# Patient Record
Sex: Female | Born: 1945 | Race: White | Hispanic: No | Smoking: Never smoker
Health system: Southern US, Community
[De-identification: ages and names within clinical notes are randomized; demographics above are authoritative.]

## PROBLEM LIST (undated history)

## (undated) DIAGNOSIS — R9430 Abnormal result of cardiovascular function study, unspecified: Secondary | ICD-10-CM

## (undated) DIAGNOSIS — H269 Unspecified cataract: Secondary | ICD-10-CM

## (undated) DIAGNOSIS — E781 Pure hyperglyceridemia: Secondary | ICD-10-CM

## (undated) DIAGNOSIS — I10 Essential (primary) hypertension: Secondary | ICD-10-CM

## (undated) DIAGNOSIS — I5181 Takotsubo syndrome: Secondary | ICD-10-CM

## (undated) DIAGNOSIS — J189 Pneumonia, unspecified organism: Secondary | ICD-10-CM

## (undated) DIAGNOSIS — IMO0002 Reserved for concepts with insufficient information to code with codable children: Secondary | ICD-10-CM

## (undated) DIAGNOSIS — B1920 Unspecified viral hepatitis C without hepatic coma: Secondary | ICD-10-CM

## (undated) HISTORY — PX: COLONOSCOPY: SHX174

## (undated) HISTORY — DX: Reserved for concepts with insufficient information to code with codable children: IMO0002

## (undated) HISTORY — DX: Takotsubo syndrome: I51.81

## (undated) HISTORY — DX: Abnormal result of cardiovascular function study, unspecified: R94.30

## (undated) HISTORY — PX: CHOLECYSTECTOMY: SHX55

## (undated) HISTORY — DX: Unspecified cataract: H26.9

---

## 1997-10-31 ENCOUNTER — Other Ambulatory Visit: Admission: RE | Admit: 1997-10-31 | Discharge: 1997-10-31 | Payer: Self-pay | Admitting: Gastroenterology

## 1998-01-18 ENCOUNTER — Other Ambulatory Visit: Admission: RE | Admit: 1998-01-18 | Discharge: 1998-01-18 | Payer: Self-pay | Admitting: *Deleted

## 1998-07-01 ENCOUNTER — Ambulatory Visit (HOSPITAL_COMMUNITY): Admission: RE | Admit: 1998-07-01 | Discharge: 1998-07-01 | Payer: Self-pay | Admitting: Gastroenterology

## 1998-07-01 ENCOUNTER — Encounter: Payer: Self-pay | Admitting: Gastroenterology

## 2000-01-08 ENCOUNTER — Encounter: Payer: Self-pay | Admitting: Emergency Medicine

## 2000-01-08 ENCOUNTER — Encounter: Admission: RE | Admit: 2000-01-08 | Discharge: 2000-01-08 | Payer: Self-pay | Admitting: Emergency Medicine

## 2001-05-18 ENCOUNTER — Inpatient Hospital Stay (HOSPITAL_COMMUNITY): Admission: EM | Admit: 2001-05-18 | Discharge: 2001-05-19 | Payer: Self-pay | Admitting: Surgery

## 2001-05-18 ENCOUNTER — Encounter (INDEPENDENT_AMBULATORY_CARE_PROVIDER_SITE_OTHER): Payer: Self-pay | Admitting: Specialist

## 2001-05-18 ENCOUNTER — Encounter: Payer: Self-pay | Admitting: Surgery

## 2001-08-01 ENCOUNTER — Ambulatory Visit (HOSPITAL_COMMUNITY): Admission: RE | Admit: 2001-08-01 | Discharge: 2001-08-01 | Payer: Self-pay | Admitting: Gastroenterology

## 2001-08-01 ENCOUNTER — Encounter (INDEPENDENT_AMBULATORY_CARE_PROVIDER_SITE_OTHER): Payer: Self-pay | Admitting: Specialist

## 2007-01-28 ENCOUNTER — Ambulatory Visit: Payer: Self-pay | Admitting: Family Medicine

## 2007-01-28 DIAGNOSIS — I10 Essential (primary) hypertension: Secondary | ICD-10-CM

## 2007-01-28 DIAGNOSIS — E1165 Type 2 diabetes mellitus with hyperglycemia: Secondary | ICD-10-CM | POA: Insufficient documentation

## 2007-03-07 ENCOUNTER — Ambulatory Visit: Payer: Self-pay | Admitting: Family Medicine

## 2007-03-07 DIAGNOSIS — B182 Chronic viral hepatitis C: Secondary | ICD-10-CM

## 2007-03-07 LAB — CONVERTED CEMR LAB
ALT: 88 units/L — ABNORMAL HIGH (ref 0–35)
AST: 76 units/L — ABNORMAL HIGH (ref 0–37)
Calcium: 9.4 mg/dL (ref 8.4–10.5)
Chloride: 102 meq/L (ref 96–112)
Creatinine, Ser: 0.58 mg/dL (ref 0.40–1.20)
Hgb A1c MFr Bld: 5.4 %
Sodium: 141 meq/L (ref 135–145)

## 2007-03-23 ENCOUNTER — Encounter: Admission: RE | Admit: 2007-03-23 | Discharge: 2007-03-23 | Payer: Self-pay | Admitting: Family Medicine

## 2007-04-12 ENCOUNTER — Encounter: Payer: Self-pay | Admitting: Family Medicine

## 2007-05-02 ENCOUNTER — Ambulatory Visit: Payer: Self-pay | Admitting: Family Medicine

## 2007-05-02 DIAGNOSIS — Z86010 Personal history of colonic polyps: Secondary | ICD-10-CM

## 2007-07-13 ENCOUNTER — Telehealth (INDEPENDENT_AMBULATORY_CARE_PROVIDER_SITE_OTHER): Payer: Self-pay | Admitting: *Deleted

## 2007-07-29 ENCOUNTER — Encounter: Payer: Self-pay | Admitting: Family Medicine

## 2007-08-08 ENCOUNTER — Ambulatory Visit: Payer: Self-pay | Admitting: Family Medicine

## 2007-10-11 ENCOUNTER — Telehealth: Payer: Self-pay | Admitting: Family Medicine

## 2007-11-17 ENCOUNTER — Encounter: Payer: Self-pay | Admitting: Family Medicine

## 2007-11-22 ENCOUNTER — Encounter: Payer: Self-pay | Admitting: Family Medicine

## 2007-12-05 ENCOUNTER — Ambulatory Visit: Payer: Self-pay | Admitting: Family Medicine

## 2007-12-06 ENCOUNTER — Telehealth: Payer: Self-pay | Admitting: *Deleted

## 2007-12-08 ENCOUNTER — Ambulatory Visit: Payer: Self-pay | Admitting: Family Medicine

## 2007-12-08 LAB — CONVERTED CEMR LAB
Albumin: 3.9 g/dL (ref 3.5–5.2)
Alkaline Phosphatase: 67 units/L (ref 39–117)
BUN: 25 mg/dL — ABNORMAL HIGH (ref 6–23)
Calcium: 9.2 mg/dL (ref 8.4–10.5)
Chloride: 104 meq/L (ref 96–112)
Glucose, Bld: 141 mg/dL — ABNORMAL HIGH (ref 70–99)
HDL: 34 mg/dL — ABNORMAL LOW (ref >39)
Potassium: 4.5 meq/L (ref 3.5–5.3)
Triglycerides: 382 mg/dL — ABNORMAL HIGH (ref <150)

## 2007-12-21 ENCOUNTER — Ambulatory Visit: Payer: Self-pay | Admitting: Internal Medicine

## 2008-01-12 ENCOUNTER — Ambulatory Visit: Payer: Self-pay | Admitting: Internal Medicine

## 2008-01-12 ENCOUNTER — Encounter: Payer: Self-pay | Admitting: Internal Medicine

## 2008-01-16 ENCOUNTER — Encounter: Payer: Self-pay | Admitting: Internal Medicine

## 2008-01-17 ENCOUNTER — Encounter: Payer: Self-pay | Admitting: Family Medicine

## 2008-02-21 ENCOUNTER — Ambulatory Visit: Payer: Self-pay | Admitting: Sports Medicine

## 2008-02-21 ENCOUNTER — Encounter: Payer: Self-pay | Admitting: Family Medicine

## 2008-02-21 DIAGNOSIS — N958 Other specified menopausal and perimenopausal disorders: Secondary | ICD-10-CM

## 2008-02-21 DIAGNOSIS — E669 Obesity, unspecified: Secondary | ICD-10-CM | POA: Insufficient documentation

## 2008-02-23 ENCOUNTER — Encounter: Payer: Self-pay | Admitting: Family Medicine

## 2008-03-12 ENCOUNTER — Encounter: Payer: Self-pay | Admitting: Family Medicine

## 2008-03-14 ENCOUNTER — Encounter: Payer: Self-pay | Admitting: Family Medicine

## 2008-04-13 ENCOUNTER — Ambulatory Visit: Payer: Self-pay | Admitting: Family Medicine

## 2008-04-13 DIAGNOSIS — E1142 Type 2 diabetes mellitus with diabetic polyneuropathy: Secondary | ICD-10-CM

## 2008-04-13 LAB — CONVERTED CEMR LAB
Glucose, Urine, Semiquant: NEGATIVE
Hgb A1c MFr Bld: 6.2 %
Ketones, urine, test strip: NEGATIVE
Specific Gravity, Urine: 1.02
pH: 7

## 2008-06-26 ENCOUNTER — Ambulatory Visit: Payer: Self-pay | Admitting: Family Medicine

## 2008-10-17 ENCOUNTER — Encounter: Payer: Self-pay | Admitting: Family Medicine

## 2009-01-05 ENCOUNTER — Emergency Department (HOSPITAL_COMMUNITY): Admission: EM | Admit: 2009-01-05 | Discharge: 2009-01-05 | Payer: Self-pay | Admitting: Emergency Medicine

## 2009-01-25 ENCOUNTER — Ambulatory Visit: Payer: Self-pay | Admitting: Family Medicine

## 2009-01-25 DIAGNOSIS — E781 Pure hyperglyceridemia: Secondary | ICD-10-CM

## 2009-01-25 DIAGNOSIS — G4700 Insomnia, unspecified: Secondary | ICD-10-CM

## 2009-02-26 ENCOUNTER — Ambulatory Visit: Payer: Self-pay | Admitting: Family Medicine

## 2009-02-26 ENCOUNTER — Encounter: Payer: Self-pay | Admitting: Family Medicine

## 2009-02-27 LAB — CONVERTED CEMR LAB
CO2: 24 meq/L (ref 19–32)
Calcium: 9.1 mg/dL (ref 8.4–10.5)
Chloride: 101 meq/L (ref 96–112)
Glucose, Bld: 165 mg/dL — ABNORMAL HIGH (ref 70–99)
LDL Cholesterol: 57 mg/dL (ref 0–99)
Sodium: 139 meq/L (ref 135–145)
Total CHOL/HDL Ratio: 4.3
VLDL: 59 mg/dL — ABNORMAL HIGH (ref 0–40)

## 2009-07-03 ENCOUNTER — Ambulatory Visit: Payer: Self-pay | Admitting: Family Medicine

## 2009-07-03 LAB — CONVERTED CEMR LAB: Hgb A1c MFr Bld: 7.2 %

## 2010-02-21 ENCOUNTER — Encounter: Payer: Self-pay | Admitting: Family Medicine

## 2010-07-24 NOTE — Miscellaneous (Signed)
  Clinical Lists Changes  Medications: Changed medication from AMARYL 2 MG  TABS (GLIMEPIRIDE) one half tab  by mouth daily to AMARYL 2 MG  TABS (GLIMEPIRIDE) one tab  by mouth daily

## 2010-07-24 NOTE — Assessment & Plan Note (Signed)
Summary: f/u dm,df   Vital Signs:  Patient profile:   65 year old female Weight:      193.8 pounds Temp:     98 degrees F oral Pulse rate:   76 / minute Pulse rhythm:   regular BP sitting:   132 / 78  (right arm) Cuff size:   large  Vitals Entered By: Loralee Pacas CMA (July 03, 2009 3:01 PM)  Primary Care Provider:  Doralee Albino MD   History of Present Illness: C/O itching for months and it coincided with starting losartin.    BP is good.  Admits to dietary indiscretion over holidays.  Discussed mild increase in A1C.    No other complaints.  Current Medications (verified): 1)  Amaryl 2 Mg  Tabs (Glimepiride) .... One Half Tab  By Mouth Daily 2)  Metformin Hcl 1000 Mg  Tabs (Metformin Hcl) .... One Tab Bid 3)  Aspir-Low 81 Mg Tbec (Aspirin) .... One Daily 4)  Losartan Potassium-Hctz 100-12.5 Mg Tabs (Losartan Potassium-Hctz) .... One By Mouth Daily 5)  Claritin 10 Mg Tabs (Loratadine) .... Once Daily As Needed Allergy 6)  Metoprolol Tartrate 50 Mg  Tabs (Metoprolol Tartrate) .... One By Mouth Two Times A Day 7)  Ascensia Contour Test   Strp (Glucose Blood) .... Test Blood Sugar Twice Daily 8)  Relion Lancets   Misc (Lancets) .... Use To Test Blood Sugar Twice Daily 9)  Nortriptyline Hcl 25 Mg Caps (Nortriptyline Hcl) .... One By Mouth Three Times A Day 10)  Zolpidem Tartrate 10 Mg Tabs (Zolpidem Tartrate) .... One Tab By Mouth At Bedtime As Needed Sleep  Allergies (verified): 1)  ! Lisinopril  Past History:  Past medical, surgical, family and social histories (including risk factors) reviewed, and no changes noted (except as noted below).  Past Medical History: Reviewed history from 01/28/2007 and no changes required. See HPI  Past Surgical History: Reviewed history from 01/31/2007 and no changes required. S/P cholecystectomy around 2000 S/P BTL  Family History: Reviewed history from 01/28/2007 and no changes required. Family History of  Alcoholism/Addiction Family History of Aneurysm Aortic Family History Hypertension Family History of Stroke M - actually was cerebral hemorrhage Family History of CAD Female 1st degree relative <50  Social History: Reviewed history from 08/08/2007 and no changes required. 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 Tobacco never EtOh she watches intake. Has joined gym  Physical Exam  General:  Well-developed,well-nourished,in no acute distress; alert,appropriate and cooperative throughout examination Lungs:  Normal respiratory effort, chest expands symmetrically. Lungs are clear to auscultation, no crackles or wheezes. Heart:  Normal rate and regular rhythm. S1 and S2 normal without gallop, murmur, click, rub or other extra sounds. Skin:  No lesions, just dry   Impression & Recommendations:  Problem # 1:  HYPERTENSION, BENIGN ESSENTIAL (ICD-401.1)  Doubt allergy to losartin.  Probably just dry skin and her use of body wash.  See instructions Her updated medication list for this problem includes:    Losartan Potassium-hctz 100-12.5 Mg Tabs (Losartan potassium-hctz) ..... One by mouth daily    Metoprolol Tartrate 50 Mg Tabs (Metoprolol tartrate) ..... One by mouth two times a day  Orders: [facility]- Est Level  3 (40981)  Problem # 2:  DM, UNCOMPLICATED, TYPE II (ICD-250.00) Focus on diet.  FU three months.  If still elevated, increase amaryl. Her updated medication list for this problem includes:    Amaryl 2 Mg Tabs (Glimepiride) ..... One half tab  by mouth daily  Metformin Hcl 1000 Mg Tabs (Metformin hcl) ..... One tab bid    Aspir-low 81 Mg Tbec (Aspirin) ..... One daily    Losartan Potassium-hctz 100-12.5 Mg Tabs (Losartan potassium-hctz) ..... One by mouth daily  Orders: A1C-[facility] (16109) [facility]- Est Level  3 (60454)  Complete Medication List: 1)  Amaryl 2 Mg Tabs (Glimepiride) .... One half tab  by mouth daily 2)  Metformin Hcl 1000 Mg Tabs (Metformin hcl) .... One tab  bid 3)  Aspir-low 81 Mg Tbec (Aspirin) .... One daily 4)  Losartan Potassium-hctz 100-12.5 Mg Tabs (Losartan potassium-hctz) .... One by mouth daily 5)  Claritin 10 Mg Tabs (Loratadine) .... Once daily as needed allergy 6)  Metoprolol Tartrate 50 Mg Tabs (Metoprolol tartrate) .... One by mouth two times a day 7)  Ascensia Contour Test Strp (Glucose blood) .... Test blood sugar twice daily 8)  Relion Lancets Misc (Lancets) .... Use to test blood sugar twice daily 9)  Nortriptyline Hcl 25 Mg Caps (Nortriptyline hcl) .... One by mouth three times a day 10)  Zolpidem Tartrate 10 Mg Tabs (Zolpidem tartrate) .... One tab by mouth at bedtime as needed sleep  Other Orders: Influenza Vaccine NON MCR (09811)  Patient Instructions: 1)  First stop body wash 2)  If still itching try off losartin for 2 weeks and call me 3)  Get more serious about the diet and exercise, You diabetes control is not quite as good as before. 4)  I should recheck in 3 monrths, sooner if we have to switch you from losartin. 5)  Get recertified with Jaynee Eagles - once recertifiied, lets get that mammogram done.   Immunizations Administered:  Influenza Vaccine # 1:    Vaccine Type: Fluvax Non-MCR    Site: right deltoid    Mfr: GlaxoSmithKline    Dose: 0.5 ml    Route: IM    Given by: Loralee Pacas CMA    Exp. Date: 12/19/2009    Lot #: [id number]    VIS given: 01/29/2009  Flu Vaccine Consent Questions:    Do you have a history of severe allergic reactions to this vaccine? no    Any prior history of allergic reactions to egg and/or gelatin? no    Do you have a sensitivity to the preservative Thimersol? no    Do you have a past history of Guillan-Barre Syndrome? no    Do you currently have an acute febrile illness? no    Have you ever had a severe reaction to latex? no    Vaccine information given and explained to patient? yes    Are you currently pregnant? no   Laboratory Results   Blood Tests   Date/Time  Received: July 03, 2009 3:00 PM  Date/Time Reported: July 03, 2009 3:14 PM   HGBA1C: 7.2%   (Normal Range: Non-Diabetic - 3-6%   Control Diabetic - 6-8%)  Comments: ...........test performed by...........Marland KitchenTerese Door, CMA      Prevention & Chronic Care Immunizations   Influenza vaccine: Fluvax Non-MCR  (07/03/2009)   Influenza vaccine due: 04/13/2009    Tetanus booster: 10/20/2004: given   Tetanus booster due: 10/21/2014    Pneumococcal vaccine: Not documented    H. zoster vaccine: Not documented  Colorectal Screening   Hemoccult: Not documented   Hemoccult due: Not Indicated    Colonoscopy: abnormal  (01/12/2008)   Colonoscopy due: 01/11/2018  Other Screening   Pap smear: normal  (02/21/2008)   Pap smear due: 02/21/2011    Mammogram: normal  (  03/23/2007)   Mammogram due: 03/22/2008    DXA bone density scan: Not documented   Smoking status: never  (01/25/2009)  Diabetes Mellitus   HgbA1C: 7.2  (07/03/2009)   Hemoglobin A1C due: 11/05/2007    Eye exam: normal  (10/17/2008)   Eye exam due: 10/17/2009    Foot exam: yes  (04/13/2008)   Foot exam action/deferral: Do today   High risk foot: Not documented   Foot care education: Not documented   Foot exam due: 12/04/2008    Urine microalbumin/creatinine ratio: Not documented   Urine microalbumin action/deferral: Not indicated    Diabetes flowsheet reviewed?: Yes   Progress toward A1C goal: Unchanged  Lipids   Total Cholesterol: 151  (02/26/2009)   LDL: 57  (02/26/2009)   LDL Direct: Not documented   HDL: 35  (02/26/2009)   Triglycerides: 293  (02/26/2009)    SGOT (AST): 67  (12/08/2007)   SGPT (ALT): 77  (12/08/2007)   Alkaline phosphatase: 67  (12/08/2007)   Total bilirubin: 0.4  (12/08/2007)    Lipid flowsheet reviewed?: Yes   Progress toward LDL goal: At goal  Hypertension   Last Blood Pressure: 132 / 78  (07/03/2009)   Serum creatinine: 0.72  (02/26/2009)   Serum potassium 3.8   (02/26/2009)    Hypertension flowsheet reviewed?: Yes   Progress toward BP goal: At goal  Self-Management Support :   Personal Goals (by the next clinic visit) :     Personal A1C goal: 7  (07/03/2009)     Personal blood pressure goal: 130/80  (07/03/2009)     Personal LDL goal: 100  (07/03/2009)    Diabetes self-management support: Written self-care plan  (07/03/2009)   Diabetes care plan printed    Hypertension self-management support: Written self-care plan  (07/03/2009)   Hypertension self-care plan printed.    Lipid self-management support: Written self-care plan  (07/03/2009)   Lipid self-care plan printed.

## 2010-07-31 ENCOUNTER — Other Ambulatory Visit: Payer: Self-pay | Admitting: Family Medicine

## 2010-07-31 MED ORDER — METFORMIN HCL 1000 MG PO TABS: 1000.0000 mg | ORAL_TABLET | Freq: Two times a day (BID) | ORAL | Status: DC

## 2010-07-31 NOTE — Telephone Encounter (Signed)
There was an error with this electronic refill.

## 2010-08-01 ENCOUNTER — Other Ambulatory Visit: Payer: Self-pay | Admitting: Family Medicine

## 2010-08-01 MED ORDER — METFORMIN HCL 1000 MG PO TABS: 1000.0000 mg | ORAL_TABLET | Freq: Two times a day (BID) | ORAL | Status: DC

## 2010-08-01 NOTE — Telephone Encounter (Signed)
Addended by: Tivis Ringer on: 08/01/2010 09:42 AM   Modules accepted: Orders

## 2010-08-05 NOTE — Telephone Encounter (Signed)
Received a error message on the metformin prescription.  I called the pharmacy and verified that they do have a prescription for her waiting to be picked up.  No further action needed.

## 2010-08-20 ENCOUNTER — Encounter: Payer: Self-pay | Admitting: Family Medicine

## 2010-08-20 ENCOUNTER — Ambulatory Visit (INDEPENDENT_AMBULATORY_CARE_PROVIDER_SITE_OTHER): Payer: Self-pay | Admitting: Family Medicine

## 2010-08-20 VITALS — BP 160/90 | HR 80 | Temp 97.6°F | Ht 60.75 in | Wt 186.4 lb

## 2010-08-20 DIAGNOSIS — E781 Pure hyperglyceridemia: Secondary | ICD-10-CM

## 2010-08-20 DIAGNOSIS — I10 Essential (primary) hypertension: Secondary | ICD-10-CM

## 2010-08-20 DIAGNOSIS — E119 Type 2 diabetes mellitus without complications: Secondary | ICD-10-CM

## 2010-08-20 LAB — CONVERTED CEMR LAB
ALT: 74 units/L — ABNORMAL HIGH (ref 0–35)
Alkaline Phosphatase: 97 units/L (ref 39–117)
Creatinine, Ser: 0.65 mg/dL (ref 0.40–1.20)
Glucose, Bld: 363 mg/dL — ABNORMAL HIGH (ref 70–99)
Sodium: 131 meq/L — ABNORMAL LOW (ref 135–145)
Total Bilirubin: 0.6 mg/dL (ref 0.3–1.2)
Total CHOL/HDL Ratio: 6
Total Protein: 7.8 g/dL (ref 6.0–8.3)
Triglycerides: 717 mg/dL — ABNORMAL HIGH (ref <150)

## 2010-08-20 LAB — LIPID PANEL
HDL: 27 mg/dL — ABNORMAL LOW (ref >39)
Total CHOL/HDL Ratio: 6 Ratio

## 2010-08-20 LAB — COMPREHENSIVE METABOLIC PANEL
AST: 64 U/L — ABNORMAL HIGH (ref 0–37)
Alkaline Phosphatase: 97 U/L (ref 39–117)
BUN: 22 mg/dL (ref 6–23)
Creat: 0.65 mg/dL (ref 0.40–1.20)

## 2010-08-20 MED ORDER — GLIMEPIRIDE 4 MG PO TABS: 4.0000 mg | ORAL_TABLET | Freq: Every day | ORAL | Status: DC

## 2010-08-20 NOTE — Progress Notes (Signed)
I called this prescription in after getting the error message

## 2010-08-20 NOTE — Progress Notes (Signed)
  Subjective:    Patient ID: Becky Fields, female    DOB: [birth year], 65 y.o.   MRN: [id number]  HPI Called in glimiperide to pharm after notification of failed e Rx   Review of Systems     Objective:   Physical Exam        Assessment & Plan:

## 2010-08-20 NOTE — Progress Notes (Signed)
  Subjective:    Patient ID: Becky Fields, female    DOB: [birth year], 65 y.o.   MRN: [id number]  HPI  Returns after one year hiatus due to finances. DM poor control.  Diet is healthier a very small weight loss.  Not exercising HBP not on one of her meds.  High No blood work in over 1 year.  Fasting this morning so can check chol Gernerally feels well    Review of SystemsNo chest pain, SOB, leg edema or unusual bleeding     Objective:   Physical Exam Neck supple  Lungs clear Cardiac RRR without murmur Abd benign        Assessment & Plan:

## 2010-08-20 NOTE — Patient Instructions (Signed)
Two prescriptions at [facility].  Restart a hypertension med and a higher dose of your glimepiride. Keep working on diet and exercise.  Meds alone will not bring your Diabetes under control See Rudell Cobb.  Call me when certified and I will order a mammogram See me in 2-3 months.  We will need to do a pap smear (your last one) this year.

## 2010-08-20 NOTE — Assessment & Plan Note (Signed)
Restart losartin/HCTZ

## 2010-08-20 NOTE — Assessment & Plan Note (Signed)
Recheck lipid panel 

## 2010-08-20 NOTE — Assessment & Plan Note (Signed)
Increase glimepiride.  Emphasize diet and exercise

## 2010-08-21 ENCOUNTER — Encounter: Payer: Self-pay | Admitting: Family Medicine

## 2010-08-21 ENCOUNTER — Telehealth: Payer: Self-pay | Admitting: Family Medicine

## 2010-08-21 NOTE — Telephone Encounter (Signed)
Called and left message that needs new med for triglycerides - so call office.  Also sent letter.

## 2010-08-22 ENCOUNTER — Telehealth: Payer: Self-pay | Admitting: Family Medicine

## 2010-08-22 MED ORDER — FENOFIBRATE 145 MG PO TABS: 145.0000 mg | ORAL_TABLET | Freq: Every day | ORAL | Status: DC

## 2010-08-22 NOTE — Telephone Encounter (Signed)
Started on tricor for triglycerides above 500

## 2010-08-22 NOTE — Telephone Encounter (Signed)
Pt is calling Dr Leveda Anna back about her trigycerides.  Please call her at number provided below

## 2010-08-25 ENCOUNTER — Telehealth: Payer: Self-pay | Admitting: Family Medicine

## 2010-08-25 MED ORDER — OMEGA-3 FATTY ACIDS 1000 MG PO CAPS: 1.0000 g | ORAL_CAPSULE | Freq: Three times a day (TID) | ORAL | Status: AC

## 2010-08-25 NOTE — Telephone Encounter (Signed)
Due to cost, will treat high triglycerides with fish oil rather than tricor.

## 2010-08-26 ENCOUNTER — Other Ambulatory Visit: Payer: Self-pay | Admitting: Family Medicine

## 2010-08-27 NOTE — Telephone Encounter (Signed)
Refill request

## 2010-09-17 ENCOUNTER — Other Ambulatory Visit: Payer: Self-pay | Admitting: Family Medicine

## 2010-09-18 NOTE — Telephone Encounter (Signed)
Refill request

## 2010-09-28 LAB — GLUCOSE, CAPILLARY: Glucose-Capillary: 124 mg/dL — ABNORMAL HIGH (ref 70–99)

## 2010-10-17 ENCOUNTER — Other Ambulatory Visit: Payer: Self-pay | Admitting: Family Medicine

## 2010-10-18 NOTE — Telephone Encounter (Signed)
Refill request

## 2010-11-07 NOTE — Procedures (Signed)
[place]. [facility]  Patient:    Becky Fields, Becky Fields Visit Number: [id number] MRN: [id number]          Service Type: END Location: ENDO Attending Physician:  Nelda Marseille Dictated by:   Petra Kuba, M.D. Proc. Date: 08/01/01 Admit Date:  08/01/2001   CC:         Viviann Spare A. Cleta Alberts, M.D.   Procedure Report  PROCEDURE:  Colonoscopy, polypectomy.  INDICATION:  Patient with a family history of colon polyps, overdue for colonic screening.  Consent was signed after risks, benefits, methods, and options thoroughly discussed in the office.  MEDICATIONS:  Demerol 100 mg, Versed 10 mg.  DESCRIPTION OF PROCEDURE:  Rectal inspection was pertinent for external hemorrhoids.  Digital exam was negative.  The video pediatric adjustable colonoscope was inserted, easily advanced around the colon to the cecum.  This did require some abdominal pressure but no position changes.  The cecum was identified by the appendiceal orifice and the ileocecal valve.  In fact, the scope was inserted a short way into the terminal ileum, which was normal. Photo documentation was obtained.  The scope was slowly withdrawn.  The cecum was normal.  The prep was adequate.  There was some liquid stool throughout that required frequent washing and suctioning.  In the ascending colon, two tiny questionable polyps were seen and were all hot biopsied x1.  In the transverse, another questionable tiny polyp was seen and was hot biopsied x1 and put in the same container.  As the scope was withdrawn around the left side of the colon in the mid-descending, a questionable another tiny polyp was seen and was hot biopsied and put in a separate container.  The scope was slowly withdrawn back to the rectum.  No additional findings were seen.  Once back in the rectum, the scope was then retroflexed, pertinent for some internal hemorrhoids.  The scope was straightened and readvanced a short way around the  left side of the colon, air was suctioned, and the scope removed. The patient tolerated the procedure well.  There was no obvious immediate complication.  ENDOSCOPIC DIAGNOSES: 1. Internal/external hemorrhoids, small. 2. Descending, ascending, and transverse polyps, all tiny and hot biopsied. 3. Otherwise within normal limits to the terminal ileum.  PLAN:  Await pathology to determine future colonic screening and continue workup with an EGD.  Please see that dictation for details. Dictated by:   Petra Kuba, M.D. Attending Physician:  Nelda Marseille DD:  08/01/01 TD:  08/01/01 Job: [id number]

## 2010-11-07 NOTE — H&P (Signed)
[facility]  Patient:    Becky Fields, Becky Fields Visit Number: [id number] MRN: [id number]          Service Type: EMS Location: ED Attending Physician:  Andre Lefort Dictated by:   Sandria Bales. Ezzard Standing, M.D. Admit Date:  05/18/2001   CC:         Lesle Chris, M.D.  Petra Kuba, M.D.   History and Physical  DATE OF BIRTH: [birth year]  HISTORY OF PRESENT ILLNESS: This is a 65 year old, white female, whose primary care doctor is Dr. Lesle Chris. She sees Dr. Vida Rigger from a GI standpoint, who has had vague abdominal complaints for the last couple of months. These are often precipitated by food. She actually had an appointment to see Dr. Ewing Schlein sometime in the future. Then this past Sunday, the 24th of November, she developed nausea, vomiting, and increasing abdominal pain, which has gotten steadily worse over the last few days. She saw Dr. Cleta Alberts today, who sent her to [facility]. At [facility] she had an ultrasound of her abdomen read by Mattie Marlin, which revealed cholelithiasis with changes consistent with cholecystitis.  The patient has ulcer as a 26 year old child, but has had no ulcer disease as an adult. She has had a diagnosis of hepatitis C, apparently first diagonal non-A 9b in the early 1990s, then refined to a hepatitis C in 1999 and has been followed by Dr. Vida Rigger. Her last liver biopsy, she thinks, was about one year ago.  She has a history of peptic ulcer disease, colon disease. Her only prior abdominal surgery was a tubal ligation about 1991.  ALLERGIES: She has no allergies though CODEINE makes her dizzy.  REVIEW OF SYSTEMS: PULMONARY: No history of pneumonia. She has had some respiratory trouble which actually worsened in 1999 when she found out she had hepatitis C, but with annual immunization her lungs have gotten steadily better. She is actually doing pretty well at this time. CARDIAC: No history  of known heart disease, chest pain or hypertension. GASTROINTESTINAL: See history of present illness. UROLOGIC: No history of kidney stones or kidney infection. GYNECOLOGIC: She had her last menstrual period about 25. She is a gravida 2, para 2. 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  She is accompanied by her husband in the emergency room.  PHYSICAL EXAMINATION:  VITAL SIGNS: On physical examination, her temperature is 100.2, respirations 20, blood pressure 137/91, pulse of 96.  GENERAL: She is a well-nourished, white female, alert and cooperative examination.  HEENT: Her HEENT is unremarkable.  NECK: Supple without mass or thyromegaly.  LUNGS: Clear to auscultation.  HEART: Regular rate and rhythm without murmur or rub.  BREASTS: Her breast are symmetrical. She has had mammograms which have been negative.  ABDOMEN: Soft. She does have soreness, tenderness in the right upper quadrant consistent with biliary disease. I feel no other organomegaly, mass or hernia.  RECTAL: I did not do a rectal examination on her.  EXTREMITIES: She has good strength in all four extremities.  NEUROLOGICAL: Grossly intact.  LABORATORY DATA: Labs that I have show a white blood count of 15,300, a hemoglobin of 16/7. Her lipase was 14, amylase 42. Her serum electrocardiogram were sodium 132, potassium 3.9, chloride of 97, glucose of 127. Her SGOT is 64, SGPT is 64. Her alk. phos. is 85, her total bilirubin 1.4.  IMPRESSION: 1. Acute cholecystitis with cholelithiasis. Discussed with the patient and her    husband about proceeding with surgery  today. Discussed the potential    complications including to but not limited to bleeding, infection, bowel    duct injury, and possible open surgery. I think they understand pretty well    the operation and potential side effects.  We will proceed with    cholecystectomy today. 2. Hepatitis C, stable at this time, followed by Dr. Vida Rigger. 3. History of respiratory problems which are on the mend at this current time. ictated by:   Sandria Bales. Ezzard Standing, M.D. Attending Physician:  Andre Lefort DD:  05/18/01 TD:  05/18/01 Job: [id number]

## 2010-11-07 NOTE — Op Note (Signed)
[place]. [facility]  Patient:    Becky Fields, Becky Fields Visit Number: [id number] MRN: [id number]          Service Type: SUR Location: 3W 0348 01 Attending Physician:  Andre Lefort Dictated by:   Sandria Bales. Ezzard Standing, M.D. Proc. Date: 05/18/01 Admit Date:  05/18/2001 Discharge Date: 05/18/2001   CC:         Lesle Chris, M.D.  Petra Kuba, M.D.   Operative Report  DATE OF BIRTH:  2045-09-10  PREOPERATIVE DIAGNOSIS:  Acute cholecystitis, cholelithiasis.  POSTOPERATIVE DIAGNOSIS:  Acute cholecystitis, cholelithiasis.  PROCEDURE:  Laparoscopic cholecystectomy with intraoperative cholangiogram.  SURGEON:  Sandria Bales. Newman.  FIRST ASSISTANT:  Maisie Fus B. Samuella Cota, M.D.  ANESTHESIA:  General endotracheal.  ESTIMATED BLOOD LOSS:  Minimal.  INDICATIONS FOR PROCEDURE:  Ms. Dawe is a 65 year old white female, a patient of Dr. Lesle Chris, and has known to hepatitis C followed by Dr. Vida Rigger. She has a 2-3 month history of vague abdominal pain but now has presented with a three-day history of acute abdominal pain, nausea, and vomiting.  Ultrasound shows multiple gallbladder with thickened gallbladder wall consistent with acute cholecystitis.  The patient now comes for attempted laparoscopic cholecystectomy.  OPERATIVE NOTE:  The patient was placed in a supine position, given a general endotracheal anesthetic.  She was given 1 g of Ancef prior to the procedure. She had an NG tube in placed and PS stockings in place.  Her abdomen was prepped with Betadine solution and sterilely draped.  An infraumbilical incision was made with sharp dissection and carried down to the abdominal cavity.  A 0-degree laparoscope was inserted through a 12-mm Hasson trocar and the Hasson trocar secured with a 0 Vicryl suture.  The gallbladder was visualized, was noted to be tight, acute/chronically inflamed, with omentum stuck to it.  I dissected off the omentum, placed three  additional trocars, a 10-mm subxiphoid, a 5-mm right mid-subcostal, and a 5-mm lateral subcostal trocar.  The gallbladder was grasped, rotated cephalad.  The gallbladder was acutely inflamed.  It was a fairly difficult dissection with a thickened gallbladder wall, a lot of inflammation.  She also had very chronic disease with a lot o thickening to the wall.  I was able to get down to a cystic artery, identified this, triply Endoclipped the cystic duct, a clip placed on the gallbladder at the site of the cystic duct.  The cystic duct was then cut.  I then an intraoperative cholangiogram.  A cut off Taut catheter was inserted through the cystic duct, and intraoperative cholangiogram obtained with half-strength Hypaque solution using about 6 cc.  It showed free flow of contrast down the cystic duct into the common bile duct into the duodenum and back up the hepatic radicles.  This was felt to be a normal intraoperative cholangiogram without filling defect or obstruction.  The Taut catheter was then removed.  The cystic duct was then triply Endoclipped and divided.  The gallbladder was then sharply and bluntly dissected from the gallbladder bed using primarily Hook Bovie coagulation.  The gallbladder was then delivered into an EndoCatch bag and delivered through the umbilicus.  I enlarged the umbilical incision a little bit to get the gallbladder out.  Then, I had to put two _______ sutures at the umbilicus to control some bleeding and close the wound.  All the other trocar sites were without any evidence of bleeding.  The trocars were the removed under direct visualization.  The skin at  each site was closed a 5-0 Vicryl, painted with tincture of Benzoin, and sterilely dressed.  The patient was transported to the recovery room in good condition.  Sponge and needle count were correct at the end of the case. Dictated by:   Sandria Bales. Ezzard Standing, M.D. Attending Physician:  Andre Lefort DD:   05/18/01 TD:  05/19/01 Job: [id number]

## 2010-11-07 NOTE — Procedures (Signed)
. [facility]  Patient:    Becky Fields, Becky Fields Visit Number: [id number] MRN: [id number]          Service Type: END Location: ENDO Attending Physician:  Nelda Marseille Dictated by:   Petra Kuba, M.D. Proc. Date: 08/01/01 Admit Date:  08/01/2001   CC:         Collene Gobble, MD   Procedure Report  PROCEDURE PERFORMED:  Esophagogastroduodenoscopy with biopsies.  ENDOSCOPIST:  Petra Kuba, M.D.  INDICATIONS FOR PROCEDURE:  Periodic dysphagia.  Consent was signed after risks, benefits, methods, and options were thoroughly discussed in the office on multiple occasions.  MEDICATIONS USED:  None since it followed the colonoscopy.  DESCRIPTION OF PROCEDURE:  The video endoscope was inserted by direct vision. A quick look at her vocal cords was normal.  The scope was inserted down a normal esophagus and at the GE junction was a small medium sized hiatal hernia with a widely patent fibrous ring, no signs of obvious esophagitis, mass lesions or any other abnormalities were seen as the scope was slowly withdrawn back to 20 cm.  The scope was then readvanced to the antrum where some mild moderate antritis and a small possible antral nodule/polyp was seen which was cold biopsied at the end of the procedure.  The scope was then advanced through a normal pylorus into a normal duodenal bulb and around the C-loop to a normal second portion of the duodenum.  The scope was withdrawn back to the bulb and a good look there ruled out abnormalities in that location.  The scope was withdrawn back to the stomach and retroflexed.  The angularis, cardia, fundus, lesser and greater curves were all normal except for the cardia, confirming the hiatal hernia.  The scope was straightened and straight visualization of the stomach was normal without additional findings.  The scope was then advanced to the antrum.  Multiple biopsies of the small polyp/nodule were then  obtained and then a few of the antritis were obtained to rule out abnormality.  Air was suctioned, scope slowly withdrawn.  Again, a good look at the esophagus was normal.  The scope was removed.  The patient tolerated the procedure well.  There was no obvious immediate complication.  ENDOSCOPIC DIAGNOSIS: 1. Small to medium-sized hiatal hernia with proximal widely patent fibrous    ring. 2. Mild to moderate antritis with a small antral nodule/polyp status post    biopsy. 3. Otherwise within normal limits esophagogastroduodenoscopy.  PLAN:  Will proceed with a dilation p.r.n.  Await pathology.  Will see her back in two months to recheck symptoms.  Consider a trial of daily pump inhibitors to see if that helps.  Happy to see back sooner p.r.n.  Otherwise return care to Dr. Cleta Alberts for customary health care maintenance to include yearly rectals, guaiacs, mammogram, Pap smears etc. Dictated by:   Petra Kuba, M.D. Attending Physician:  Nelda Marseille DD:  08/01/01 TD:  08/01/01 Job: [id number]

## 2010-11-11 ENCOUNTER — Telehealth: Payer: Self-pay | Admitting: Family Medicine

## 2010-11-11 DIAGNOSIS — E781 Pure hyperglyceridemia: Secondary | ICD-10-CM

## 2010-11-11 DIAGNOSIS — E119 Type 2 diabetes mellitus without complications: Secondary | ICD-10-CM

## 2010-11-11 NOTE — Assessment & Plan Note (Signed)
Recheck labs 

## 2010-11-11 NOTE — Assessment & Plan Note (Signed)
Recheck labs on fish oil   

## 2010-11-11 NOTE — Telephone Encounter (Signed)
Done

## 2010-11-11 NOTE — Telephone Encounter (Signed)
Becky Fields is coming in tomorrow for fasting labs.  She is sched with you on 5/30 for routine appt f/u, please put in order to labs for this.

## 2010-11-12 ENCOUNTER — Other Ambulatory Visit: Payer: Self-pay

## 2010-11-12 DIAGNOSIS — E781 Pure hyperglyceridemia: Secondary | ICD-10-CM

## 2010-11-12 NOTE — Progress Notes (Signed)
D-ldl and triglycerides done today Kelty Szafran

## 2010-11-14 ENCOUNTER — Encounter: Payer: Self-pay | Admitting: Family Medicine

## 2010-11-19 ENCOUNTER — Ambulatory Visit (INDEPENDENT_AMBULATORY_CARE_PROVIDER_SITE_OTHER): Payer: Self-pay | Admitting: Family Medicine

## 2010-11-19 ENCOUNTER — Encounter: Payer: Self-pay | Admitting: Family Medicine

## 2010-11-19 VITALS — BP 159/85 | HR 79 | Temp 98.4°F | Ht 60.75 in | Wt 181.2 lb

## 2010-11-19 DIAGNOSIS — I10 Essential (primary) hypertension: Secondary | ICD-10-CM

## 2010-11-19 DIAGNOSIS — E119 Type 2 diabetes mellitus without complications: Secondary | ICD-10-CM

## 2010-11-19 MED ORDER — HYDROCHLOROTHIAZIDE 25 MG PO TABS: 25.0000 mg | ORAL_TABLET | Freq: Every day | ORAL | Status: DC

## 2010-11-19 MED ORDER — GLIMEPIRIDE 4 MG PO TABS: 8.0000 mg | ORAL_TABLET | Freq: Every day | ORAL | Status: DC

## 2010-11-19 NOTE — Patient Instructions (Signed)
Good job on weight loss BP is not so good. Diabetes control is terrible. Two new prescriptions at pharmacy. You can take the rest of your glimiperide at 2 pills per day. Come back in three months.  If you do not weight 171 or less, I will start you on insulin.

## 2010-11-19 NOTE — Assessment & Plan Note (Signed)
Could not afford losartin.  Will restart HCTZ

## 2010-11-24 NOTE — Progress Notes (Signed)
  Subjective:    Patient ID: Becky Fields, female    DOB: 1946/03/15, 65 y.o.   MRN: [id number]  HPI Feels fine BP up A1C terrible. Serious about weight loss.    Review of Systems     Objective:   Physical Exam Cardiac RRR Lungs, clear Abd benign Ext no edemal       Assessment & Plan:

## 2010-11-24 NOTE — Assessment & Plan Note (Signed)
Terrible control.  Likely needs insulin.  Will give three months for weight loss

## 2010-11-25 ENCOUNTER — Other Ambulatory Visit: Payer: Self-pay | Admitting: Family Medicine

## 2010-11-25 MED ORDER — METOPROLOL TARTRATE 50 MG PO TABS: 50.0000 mg | ORAL_TABLET | Freq: Two times a day (BID) | ORAL | Status: DC

## 2010-11-25 MED ORDER — LORATADINE 10 MG PO TABS: 10.0000 mg | ORAL_TABLET | Freq: Every day | ORAL | Status: DC | PRN

## 2011-03-20 LAB — GLUCOSE, CAPILLARY: Glucose-Capillary: 117 — ABNORMAL HIGH

## 2011-06-04 ENCOUNTER — Emergency Department (INDEPENDENT_AMBULATORY_CARE_PROVIDER_SITE_OTHER)
Admission: EM | Admit: 2011-06-04 | Discharge: 2011-06-04 | Disposition: A | Discharge status: Home / Self Care | Payer: Self-pay | Source: Home / Self Care | Attending: Emergency Medicine | Admitting: Emergency Medicine

## 2011-06-04 ENCOUNTER — Encounter (HOSPITAL_COMMUNITY): Payer: Self-pay | Admitting: Emergency Medicine

## 2011-06-04 DIAGNOSIS — N390 Urinary tract infection, site not specified: Secondary | ICD-10-CM

## 2011-06-04 DIAGNOSIS — J310 Chronic rhinitis: Secondary | ICD-10-CM

## 2011-06-04 DIAGNOSIS — H6990 Unspecified Eustachian tube disorder, unspecified ear: Secondary | ICD-10-CM

## 2011-06-04 DIAGNOSIS — H6980 Other specified disorders of Eustachian tube, unspecified ear: Secondary | ICD-10-CM

## 2011-06-04 HISTORY — DX: Essential (primary) hypertension: I10

## 2011-06-04 LAB — POCT URINALYSIS DIP (DEVICE)
Protein, ur: NEGATIVE mg/dL
Urobilinogen, UA: 1 mg/dL (ref 0.0–1.0)
pH: 5 (ref 5.0–8.0)

## 2011-06-04 MED ORDER — CEPHALEXIN 500 MG PO CAPS: 500.0000 mg | ORAL_CAPSULE | Freq: Two times a day (BID) | ORAL | Status: DC

## 2011-06-04 MED ORDER — FLUCONAZOLE 150 MG PO TABS: 150.0000 mg | ORAL_TABLET | Freq: Once | ORAL | Status: AC

## 2011-06-04 MED ORDER — FLUTICASONE PROPIONATE 50 MCG/ACT NA SUSP: 2.0000 | Freq: Every day | NASAL | Status: DC

## 2011-06-04 NOTE — ED Provider Notes (Addendum)
History     CSN: [id number] Arrival date & time: 06/04/2011  5:05 PM   First MD Initiated Contact with Patient 06/04/11 1450      Chief Complaint  Patient presents with  . Urinary Tract Infection  . Otalgia    (Consider location/radiation/quality/duration/timing/severity/associated sxs/prior treatment) HPI Comments: Alizah presents for evaluation of burning with urination, urgency, and frequency since earlier this week. She also reports a recent URI and now reports LEFT ear pain and pressure and muffled hearing. She denies any fever.  Patient is a 65 y.o. female presenting with urinary tract infection and ear pain. The history is provided by the patient.  Urinary Tract Infection This is a new problem. The current episode started more than 2 days ago. The problem has not changed since onset.The symptoms are aggravated by nothing. The symptoms are relieved by nothing.  Otalgia This is a new problem. The current episode started more than 2 days ago. There is pain in the left ear. The problem occurs constantly. The problem has not changed since onset.There has been no fever. The pain is mild.    Past Medical History  Diagnosis Date  . Diabetes mellitus   . Hypertension     Past Surgical History  Procedure Date  . Cholecystectomy     No family history on file.  History  Substance Use Topics  . Smoking status: Never Smoker   . Smokeless tobacco: Never Used  . Alcohol Use: 3.0 oz/week    6 drink(s) per week    OB History    Grav Para Term Preterm Abortions TAB SAB Ect Mult Living                  Review of Systems  Constitutional: Negative.  Negative for fever.  HENT: Positive for ear pain.   Eyes: Negative.   Respiratory: Negative.   Cardiovascular: Negative.   Gastrointestinal: Negative.   Genitourinary: Positive for dysuria, urgency, frequency and hematuria. Negative for vaginal bleeding and vaginal discharge.  Musculoskeletal: Negative.   Skin: Negative.     Neurological: Negative.     Allergies  Lisinopril and Sulfa antibiotics  Home Medications   Current Outpatient Rx  Name Route Sig Dispense Refill  . ASPIRIN 81 MG PO TABS Oral Take 81 mg by mouth daily.      Marland Kitchen GLIMEPIRIDE 4 MG PO TABS Oral Take 2 tablets (8 mg total) by mouth daily before breakfast. 60 tablet 11  . HYDROCHLOROTHIAZIDE 25 MG PO TABS Oral Take 1 tablet (25 mg total) by mouth daily. 90 tablet 3  . LORATADINE 10 MG PO TABS Oral Take 1 tablet (10 mg total) by mouth daily as needed for allergies. 90 tablet 3  . METFORMIN HCL 1000 MG PO TABS  TAKE ONE TABLET BY MOUTH TWICE DAILY 60 tablet 12  . METOPROLOL TARTRATE 50 MG PO TABS Oral Take 1 tablet (50 mg total) by mouth 2 (two) times daily. 60 tablet 12  . ZOLPIDEM TARTRATE 10 MG PO TABS  TAKE ONE TABLET BY MOUTH AT BEDTIME AS NEEDED FOR SLEEP 30 tablet 5  . CEPHALEXIN 500 MG PO CAPS Oral Take 1 capsule (500 mg total) by mouth 2 (two) times daily. 14 capsule 0  . OMEGA-3 FATTY ACIDS 1000 MG PO CAPS Oral Take 1 capsule (1 g total) by mouth 3 (three) times daily. 90 capsule 11  . FLUCONAZOLE 150 MG PO TABS Oral Take 1 tablet (150 mg total) by mouth once. Take one pill once.  May repeat if symptoms persist after 3rd day. 2 tablet 0  . FLUTICASONE PROPIONATE 50 MCG/ACT NA SUSP Nasal Place 2 sprays into the nose daily. 16 g 2    BP 175/90  Pulse 74  Temp(Src) 98.1 F (36.7 C) (Oral)  Resp 18  SpO2 96%  Physical Exam  Nursing note and vitals reviewed. Constitutional: She is oriented to person, place, and time. She appears well-developed and well-nourished.  HENT:  Head: Normocephalic and atraumatic.  Right Ear: External ear normal. Tympanic membrane is retracted.  Left Ear: External ear normal. Tympanic membrane is retracted.  Mouth/Throat: Uvula is midline, oropharynx is clear and moist and mucous membranes are normal.  Eyes: EOM are normal.  Neck: Normal range of motion.  Pulmonary/Chest: Effort normal and breath sounds  normal.  Musculoskeletal: Normal range of motion.  Neurological: She is alert and oriented to person, place, and time.  Skin: Skin is warm and dry.  Psychiatric: Her behavior is normal.    ED Course  Procedures (including critical care time)  Labs Reviewed  POCT URINALYSIS DIP (DEVICE) - Abnormal; Notable for the following:    Glucose, UA 100 (*)    Bilirubin Urine SMALL (*)    Ketones, ur TRACE (*)    Hgb urine dipstick MODERATE (*)    Nitrite POSITIVE (*)    Leukocytes, UA SMALL (*) Biochemical Testing Only. Please order routine urinalysis from main lab if confirmatory testing is needed.   All other components within normal limits  POCT URINALYSIS DIPSTICK   No results found.   1. UTI (lower urinary tract infection)   2. Rhinitis   3. Eustachian tube dysfunction       MDM  Will treat for UTI and rhinitis with eustachian tube dysfunction        Richardo Priest, MD 06/04/11 1755  Richardo Priest, MD 06/04/11 1759

## 2011-06-04 NOTE — ED Notes (Signed)
Here with uti sx burn,itch,dysurua and minimal pressure post void that started x 1wk.pt states she has been using otc azo for relief and its effective.pt also reports to left ear pain x 1wk after getting over cold sx.afebrile.

## 2011-06-04 NOTE — ED Provider Notes (Signed)
History     CSN: [id number] Arrival date & time: No admission date for patient encounter.   First MD Initiated Contact with Patient 06/04/11 1450      No chief complaint on file.   (Consider location/radiation/quality/duration/timing/severity/associated sxs/prior treatment) HPI  No past medical history on file.  No past surgical history on file.  No family history on file.  History  Substance Use Topics  . Smoking status: Never Smoker   . Smokeless tobacco: Never Used  . Alcohol Use: 3.0 oz/week    6 drink(s) per week    OB History    Grav Para Term Preterm Abortions TAB SAB Ect Mult Living                  Review of Systems  Allergies  Lisinopril and Sulfa antibiotics  Home Medications   Current Outpatient Rx  Name Route Sig Dispense Refill  . ASPIRIN 81 MG PO TABS Oral Take 81 mg by mouth daily.      . OMEGA-3 FATTY ACIDS 1000 MG PO CAPS Oral Take 1 capsule (1 g total) by mouth 3 (three) times daily. 90 capsule 11  . GLIMEPIRIDE 4 MG PO TABS Oral Take 2 tablets (8 mg total) by mouth daily before breakfast. 60 tablet 11  . HYDROCHLOROTHIAZIDE 25 MG PO TABS Oral Take 1 tablet (25 mg total) by mouth daily. 90 tablet 3  . LORATADINE 10 MG PO TABS Oral Take 1 tablet (10 mg total) by mouth daily as needed for allergies. 90 tablet 3  . METFORMIN HCL 1000 MG PO TABS  TAKE ONE TABLET BY MOUTH TWICE DAILY 60 tablet 12  . METOPROLOL TARTRATE 50 MG PO TABS Oral Take 1 tablet (50 mg total) by mouth 2 (two) times daily. 60 tablet 12  . ZOLPIDEM TARTRATE 10 MG PO TABS  TAKE ONE TABLET BY MOUTH AT BEDTIME AS NEEDED FOR SLEEP 30 tablet 5    There were no vitals taken for this visit.  Physical Exam  ED Course  Procedures (including critical care time)  Labs Reviewed - No data to display No results found.   1. Patient left without being seen       MDM  I did not participate in the care of this patient.  Luiz Blare, MD 06/04/11 [phone]

## 2011-06-05 ENCOUNTER — Telehealth: Payer: Self-pay | Admitting: Family Medicine

## 2011-06-05 DIAGNOSIS — E119 Type 2 diabetes mellitus without complications: Secondary | ICD-10-CM

## 2011-06-05 DIAGNOSIS — I10 Essential (primary) hypertension: Secondary | ICD-10-CM

## 2011-06-05 DIAGNOSIS — E781 Pure hyperglyceridemia: Secondary | ICD-10-CM

## 2011-06-05 NOTE — Assessment & Plan Note (Signed)
Order lab ?

## 2011-06-05 NOTE — Assessment & Plan Note (Signed)
labs

## 2011-06-05 NOTE — Telephone Encounter (Signed)
Ms. Yero would like to have labs early on the appt date !2/21.  She is due to see you at 10:00,but will come in at 9:00 to have her lab work.  Please put in order and I will call her to let her know.  Email instead of sending by note and I will close the enc for you.

## 2011-06-05 NOTE — Telephone Encounter (Signed)
Orders entered.  Please have patient be fasting for lab work

## 2011-06-12 ENCOUNTER — Ambulatory Visit (INDEPENDENT_AMBULATORY_CARE_PROVIDER_SITE_OTHER): Payer: Self-pay | Admitting: Family Medicine

## 2011-06-12 ENCOUNTER — Encounter: Payer: Self-pay | Admitting: Family Medicine

## 2011-06-12 VITALS — BP 153/81 | HR 68 | Temp 97.6°F | Ht 62.0 in | Wt 174.4 lb

## 2011-06-12 DIAGNOSIS — E1142 Type 2 diabetes mellitus with diabetic polyneuropathy: Secondary | ICD-10-CM

## 2011-06-12 DIAGNOSIS — E781 Pure hyperglyceridemia: Secondary | ICD-10-CM

## 2011-06-12 DIAGNOSIS — Z1231 Encounter for screening mammogram for malignant neoplasm of breast: Secondary | ICD-10-CM

## 2011-06-12 DIAGNOSIS — E119 Type 2 diabetes mellitus without complications: Secondary | ICD-10-CM

## 2011-06-12 DIAGNOSIS — Z23 Encounter for immunization: Secondary | ICD-10-CM

## 2011-06-12 DIAGNOSIS — Z1239 Encounter for other screening for malignant neoplasm of breast: Secondary | ICD-10-CM

## 2011-06-12 DIAGNOSIS — I10 Essential (primary) hypertension: Secondary | ICD-10-CM

## 2011-06-12 DIAGNOSIS — E1149 Type 2 diabetes mellitus with other diabetic neurological complication: Secondary | ICD-10-CM

## 2011-06-12 LAB — TSH: TSH: 2.03 u[IU]/mL (ref 0.350–4.500)

## 2011-06-12 LAB — LDL CHOLESTEROL, DIRECT: Direct LDL: 72 mg/dL

## 2011-06-12 LAB — POCT GLYCOSYLATED HEMOGLOBIN (HGB A1C): Hemoglobin A1C: 7

## 2011-06-12 MED ORDER — LOSARTAN POTASSIUM-HCTZ 100-25 MG PO TABS: 1.0000 | ORAL_TABLET | Freq: Every day | ORAL | Status: DC

## 2011-06-12 NOTE — Assessment & Plan Note (Signed)
Check labs 

## 2011-06-12 NOTE — Progress Notes (Signed)
  Subjective:    Patient ID: Becky Fields, female    DOB: 04-Sep-1945, 65 y.o.   MRN: [id number]  HPI Never abnormal pap.  Last pap three years ago.  No at risk behavior.  Will go with q5 year paps - which means she is done since near 56. Will get mammo Cannot afford zostavax Got flu shot today. Doing great, 10 lb wt loss.  Has orange card.  Back on meds.      Review of Systems     Objective:   Physical Exam Cardiac RRR without m Lungs, clear       Assessment & Plan:

## 2011-06-12 NOTE — Assessment & Plan Note (Signed)
Add generic ARB to improve control and for renal protection.

## 2011-06-12 NOTE — Assessment & Plan Note (Signed)
Great improvement in A1C and now at goal.  Wow.

## 2011-06-12 NOTE — Patient Instructions (Addendum)
When you get your new blood pressure medicine, stop taking your hydrochlorothiazide (HCTZ) because it is in the new medicine. The new medicine not only lowers your blood pressure, it also protects your kidneys Great work on the weight loss. Great work on the diabetes.  You have reached the goal for good control I will call and send a letter with the other blood work results. Get your mammogram No more Pap smears for you - Merry Christmas.

## 2011-06-13 LAB — COMPLETE METABOLIC PANEL WITH GFR
AST: 62 U/L — ABNORMAL HIGH (ref 0–37)
Albumin: 3.7 g/dL (ref 3.5–5.2)
Alkaline Phosphatase: 71 U/L (ref 39–117)
BUN: 21 mg/dL (ref 6–23)
Calcium: 9.3 mg/dL (ref 8.4–10.5)
Creat: 0.67 mg/dL (ref 0.50–1.10)
GFR, Est Non African American: >89 mL/min
Glucose, Bld: 142 mg/dL — ABNORMAL HIGH (ref 70–99)

## 2011-06-17 ENCOUNTER — Encounter: Payer: Self-pay | Admitting: Family Medicine

## 2011-07-10 ENCOUNTER — Ambulatory Visit
Admission: RE | Admit: 2011-07-10 | Discharge: 2011-07-10 | Disposition: A | Discharge status: Home / Self Care | Payer: Self-pay | Source: Ambulatory Visit | Attending: Family Medicine | Admitting: Family Medicine

## 2011-07-10 DIAGNOSIS — Z1231 Encounter for screening mammogram for malignant neoplasm of breast: Secondary | ICD-10-CM

## 2011-08-25 ENCOUNTER — Encounter: Payer: Self-pay | Admitting: Family Medicine

## 2011-08-25 ENCOUNTER — Ambulatory Visit (INDEPENDENT_AMBULATORY_CARE_PROVIDER_SITE_OTHER): Payer: Self-pay | Admitting: Family Medicine

## 2011-08-25 DIAGNOSIS — J069 Acute upper respiratory infection, unspecified: Secondary | ICD-10-CM | POA: Insufficient documentation

## 2011-08-25 NOTE — Progress Notes (Signed)
  Subjective:    Patient ID: Becky Fields, female    DOB: 13-Jan-1946, 66 y.o.   MRN: [id number]  HPI 3-4 days of cough  Cough. Nonproductive, no dyspnea.  She is concerned about cough sounding croupy and co-workers encouraged her to see doctor.  Coughing somewhat helped by lozenges.  She does not feel like she needs cough medicine.  Has rhinorrhea with postnasal drainage, take flonase.  I have reviewed patient's  PMH, FH, and Social history and Medications as related to this visit. No COPD, nonsmoker.    Review of Systemssee HPI  No fever, chills, dyspnea, sputum, emesis, diarrhea.     Objective:   Physical Exam GEN: Alert & Oriented, No acute distress HEENT: [place]/AT. EOMI, PERRLA, no conjunctival injection or scleral icterus.  Bilateral tympanic membranes intact without erythema or effusion.  .  Nares without edema or rhinorrhea.  Oropharynx is without erythema or exudates.  No anterior or posterior cervical lymphadenopathy CV:  Regular Rate & Rhythm, no murmur Respiratory:  Normal work of breathing, CTAB        Assessment & Plan:

## 2011-08-25 NOTE — Patient Instructions (Signed)
See info on nasal saline irrigation  Upper Respiratory Infection, Adult An upper respiratory infection (URI) is also sometimes known as the common cold. The upper respiratory tract includes the nose, sinuses, throat, trachea, and bronchi. Bronchi are the airways leading to the lungs. Most people improve within 1 week, but symptoms can last up to 2 weeks. A residual cough may last even longer.   CAUSES Many different viruses can infect the tissues lining the upper respiratory tract. The tissues become irritated and inflamed and often become very moist. Mucus production is also common. A cold is contagious. You can easily spread the virus to others by oral contact. This includes kissing, sharing a glass, coughing, or sneezing. Touching your mouth or nose and then touching a surface, which is then touched by another person, can also spread the virus. SYMPTOMS   Symptoms typically develop 1 to 3 days after you come in contact with a cold virus. Symptoms vary from person to person. They may include:  Runny nose.   Sneezing.   Nasal congestion.   Sinus irritation.   Sore throat.   Loss of voice (laryngitis).   Cough.   Fatigue.   Muscle aches.   Loss of appetite.   Headache.   Low-grade fever.  DIAGNOSIS   You might diagnose your own cold based on familiar symptoms, since most people get a cold 2 to 3 times a year. Your caregiver can confirm this based on your exam. Most importantly, your caregiver can check that your symptoms are not due to another disease such as strep throat, sinusitis, pneumonia, asthma, or epiglottitis. Blood tests, throat tests, and X-rays are not necessary to diagnose a common cold, but they may sometimes be helpful in excluding other more serious diseases. Your caregiver will decide if any further tests are required. RISKS AND COMPLICATIONS   You may be at risk for a more severe case of the common cold if you smoke cigarettes, have chronic heart disease (such as  heart failure) or lung disease (such as asthma), or if you have a weakened immune system. The very young and very old are also at risk for more serious infections. Bacterial sinusitis, middle ear infections, and bacterial pneumonia can complicate the common cold. The common cold can worsen asthma and chronic obstructive pulmonary disease (COPD). Sometimes, these complications can require emergency medical care and may be life-threatening. PREVENTION   The best way to protect against getting a cold is to practice good hygiene. Avoid oral or hand contact with people with cold symptoms. Wash your hands often if contact occurs. There is no clear evidence that vitamin C, vitamin E, echinacea, or exercise reduces the chance of developing a cold. However, it is always recommended to get plenty of rest and practice good nutrition. TREATMENT   Treatment is directed at relieving symptoms. There is no cure. Antibiotics are not effective, because the infection is caused by a virus, not by bacteria. Treatment may include:  Increased fluid intake. Sports drinks offer valuable electrolytes, sugars, and fluids.   Breathing heated mist or steam (vaporizer or shower).   Eating chicken soup or other clear broths, and maintaining good nutrition.   Getting plenty of rest.   Using gargles or lozenges for comfort.   Controlling fevers with ibuprofen or acetaminophen as directed by your caregiver.   Increasing usage of your inhaler if you have asthma.  Zinc gel and zinc lozenges, taken in the first 24 hours of the common cold, can shorten the duration  and lessen the severity of symptoms. Pain medicines may help with fever, muscle aches, and throat pain. A variety of non-prescription medicines are available to treat congestion and runny nose. Your caregiver can make recommendations and may suggest nasal or lung inhalers for other symptoms.   HOME CARE INSTRUCTIONS    Only take over-the-counter or prescription medicines  for pain, discomfort, or fever as directed by your caregiver.   Use a warm mist humidifier or inhale steam from a shower to increase air moisture. This may keep secretions moist and make it easier to breathe.   Drink enough water and fluids to keep your urine clear or pale yellow.   Rest as needed.   Return to work when your temperature has returned to normal or as your caregiver advises. You may need to stay home longer to avoid infecting others. You can also use a face mask and careful hand washing to prevent spread of the virus.  SEEK MEDICAL CARE IF:    After the first few days, you feel you are getting worse rather than better.   You need your caregiver's advice about medicines to control symptoms.   You develop chills, worsening shortness of breath, or brown or red sputum. These may be signs of pneumonia.   You develop yellow or brown nasal discharge or pain in the face, especially when you bend forward. These may be signs of sinusitis.   You develop a fever, swollen neck glands, pain with swallowing, or white areas in the back of your throat. These may be signs of strep throat.  SEEK IMMEDIATE MEDICAL CARE IF:    You have a fever.   You develop severe or persistent headache, ear pain, sinus pain, or chest pain.   You develop wheezing, a prolonged cough, cough up blood, or have a change in your usual mucus (if you have chronic lung disease).   You develop sore muscles or a stiff neck.  Document Released: 12/02/2000 Document Revised: 05/28/2011 Document Reviewed: 10/10/2010 [facility] Patient Information 2012 [place], [place].

## 2011-08-25 NOTE — Assessment & Plan Note (Signed)
Viral uri x several days.  Discussed typical course, red flags for return and possibility of prolonged post-viral cough after resolution.  Discussed nasal saline irrigation, follow-up prn.

## 2011-08-28 ENCOUNTER — Other Ambulatory Visit: Payer: Self-pay | Admitting: Family Medicine

## 2011-08-28 NOTE — Telephone Encounter (Signed)
Refill request

## 2011-08-30 ENCOUNTER — Emergency Department (INDEPENDENT_AMBULATORY_CARE_PROVIDER_SITE_OTHER)
Admission: EM | Admit: 2011-08-30 | Discharge: 2011-08-30 | Disposition: A | Discharge status: Home / Self Care | Payer: Self-pay | Source: Home / Self Care | Attending: Emergency Medicine | Admitting: Emergency Medicine

## 2011-08-30 ENCOUNTER — Encounter (HOSPITAL_COMMUNITY): Payer: Self-pay

## 2011-08-30 DIAGNOSIS — H6690 Otitis media, unspecified, unspecified ear: Secondary | ICD-10-CM

## 2011-08-30 DIAGNOSIS — J40 Bronchitis, not specified as acute or chronic: Secondary | ICD-10-CM

## 2011-08-30 DIAGNOSIS — H6692 Otitis media, unspecified, left ear: Secondary | ICD-10-CM

## 2011-08-30 MED ORDER — ANTIPYRINE-BENZOCAINE 5.4-1.4 % OT SOLN: 3.0000 [drp] | Freq: Four times a day (QID) | OTIC | Status: AC | PRN

## 2011-08-30 MED ORDER — DEXAMETHASONE 4 MG PO TABS: ORAL_TABLET | ORAL | Status: AC

## 2011-08-30 MED ORDER — ALBUTEROL SULFATE HFA 108 (90 BASE) MCG/ACT IN AERS: 1.0000 | INHALATION_SPRAY | Freq: Four times a day (QID) | RESPIRATORY_TRACT | Status: DC | PRN

## 2011-08-30 MED ORDER — AZITHROMYCIN 250 MG PO TABS: 250.0000 mg | ORAL_TABLET | Freq: Every day | ORAL | Status: AC

## 2011-08-30 NOTE — ED Notes (Signed)
Pt has lt earache and cough for one week and was seen at [facility] on Monday and told she had a cold and it has worsen with productive cough.

## 2011-08-30 NOTE — Discharge Instructions (Signed)
Take two puffs from your inhaler every 4 hours. Finish the steroids unless your doctor tells you to stop. This will elevate your blood sugar for several days. You may take tylenol 1 gram up to 4 times a day as needed for pain. This with the motrin  is an effective combination for pain and fever. Make sure you drink extra fluids. Return if you get worse, have a fever >100.4, or any other concerns.   Go to www.goodrx.com to look up your medications. This will give you a list of where you can find your prescriptions at the most affordable prices.

## 2011-08-30 NOTE — ED Provider Notes (Signed)
History     CSN: [id number]  Arrival date & time 08/30/11  1133   First MD Initiated Contact with Patient 08/30/11 1210      Chief Complaint  Patient presents with  . Otalgia    (Consider location/radiation/quality/duration/timing/severity/associated sxs/prior treatment) HPI Comments: Patient with nasal congestion, postnasal drip, URI-like symptoms starting last week, states that this is primary resolved, but now she has left ear pain, left ear fullness, decreased hearing, chest congestion, nonproductive coughing, wheezing at night. No otorrhea, sore throat, neck pain, chest pain, shortness of breath, fevers, abdominal pain. Patient is taking Robitussin-DM for cough with some improvement. No aggravating factors. Patient was [facility] by [facility] doctor [facility] early last week, was thought to have URI.  ROS as noted in HPI. All other ROS negative.   Patient is a 66 y.o. female presenting with ear pain.  Otalgia    Past Medical History  Diagnosis Date  . Diabetes mellitus   . Hypertension     Past Surgical History  Procedure Date  . Cholecystectomy     History reviewed. No pertinent family history.  History  Substance Use Topics  . Smoking status: Never Smoker   . Smokeless tobacco: Never Used  . Alcohol Use: 3.0 oz/week    6 drink(s) per week    OB History    Grav Para Term Preterm Abortions TAB SAB Ect Mult Living                  Review of Systems  HENT: Positive for ear pain.     Allergies  Lisinopril and Sulfa antibiotics  Home Medications   Current Outpatient Rx  Name Route Sig Dispense Refill  . DEXTROMETHORPHAN-GUAIFENESIN 10-100 MG/5ML PO LIQD Oral Take 5 mLs by mouth every 4 (four) hours as needed.    . ALBUTEROL SULFATE HFA 108 (90 BASE) MCG/ACT IN AERS Inhalation Inhale 1-2 puffs into the lungs every 6 (six) hours as needed for wheezing. Dispense with aerochamber 1 Inhaler 0  . ANTIPYRINE-BENZOCAINE 5.4-1.4 % OT SOLN Left Ear  Place 3 drops into the left ear 4 (four) times daily as needed for pain. 10 mL 0  . ASPIRIN 81 MG PO TABS Oral Take 81 mg by mouth daily.      . AZITHROMYCIN 250 MG PO TABS Oral Take 1 tablet (250 mg total) by mouth daily. 2 tabs po on day one, then one tablet po once daily on days 2-5. 6 tablet 0  . DEXAMETHASONE 4 MG PO TABS  4 tabs po at once on day one, 4 tabs po at once on day 2 8 tablet 0  . FLUTICASONE PROPIONATE 50 MCG/ACT NA SUSP Nasal Place 2 sprays into the nose daily. 16 g 2  . GLIMEPIRIDE 4 MG PO TABS Oral Take 2 tablets (8 mg total) by mouth daily before breakfast. 60 tablet 11  . HYDROCHLOROTHIAZIDE 25 MG PO TABS Oral Take 1 tablet (25 mg total) by mouth daily. 90 tablet 3  . LOSARTAN POTASSIUM-HCTZ 100-25 MG PO TABS Oral Take 1 tablet by mouth daily. 30 tablet 12  . METFORMIN HCL 1000 MG PO TABS  TAKE ONE TABLET BY MOUTH TWICE DAILY 60 tablet 12  . METOPROLOL TARTRATE 50 MG PO TABS Oral Take 1 tablet (50 mg total) by mouth 2 (two) times daily. 60 tablet 12  . ZOLPIDEM TARTRATE 10 MG PO TABS  TAKE ONE TABLET BY MOUTH AT BEDTIME AS NEEDED FOR SLEEP 30 tablet 5  BP 146/78  Pulse 86  Temp(Src) 98.3 F (36.8 C) (Oral)  Resp 19  SpO2 95%  Physical Exam  Nursing note and vitals reviewed. Constitutional: She is oriented to person, place, and time. She appears well-developed and well-nourished.  HENT:  Head: Normocephalic and atraumatic.  Right Ear: Tympanic membrane and ear canal normal.  Left Ear: Ear canal normal. Tympanic membrane is erythematous and bulging. A middle ear effusion is present.  Nose: Mucosal edema and rhinorrhea present. No epistaxis.  Mouth/Throat: Uvula is midline and mucous membranes are normal. Posterior oropharyngeal erythema present. No oropharyngeal exudate.       (-) frontal, maxillary sinus tenderness  Eyes: Conjunctivae and EOM are normal. Pupils are equal, round, and reactive to light.  Neck: Normal range of motion. Neck supple.  Cardiovascular:  Normal rate, regular rhythm and normal heart sounds.   Pulmonary/Chest: Effort normal. No respiratory distress. She has wheezes. She has no rales.  Abdominal: Soft. Bowel sounds are normal. She exhibits no distension.  Musculoskeletal: Normal range of motion.  Lymphadenopathy:    She has no cervical adenopathy.  Neurological: She is alert and oriented to person, place, and time.  Skin: Skin is warm and dry.  Psychiatric: She has a normal mood and affect. Her behavior is normal. Judgment and thought content normal.    ED Course  Procedures (including critical care time)  Labs Reviewed - No data to display No results found.   1. Otitis media of left ear   2. Bronchitis       MDM  Will send home with azithromycin, to cover an ear infection and any possible pneumonia. Also start on albuterol, dexamethasone, as has diffuse wheezing, particularly the bases. Advised patient that the dexamethasone will increase her blood sugars while she is taking it. Patient agrees with MDM, plan, and will followup with her Dr. [facility] as needed.  Domenick Gong, MD 08/30/11 1327

## 2011-09-08 ENCOUNTER — Telehealth: Payer: Self-pay | Admitting: Family Medicine

## 2011-09-08 NOTE — Telephone Encounter (Signed)
Pt is still having problems with her ears and wants to know if she can be referred to ENT or does she have to come in here again?

## 2011-09-08 NOTE — Telephone Encounter (Signed)
Still with resp infections - slowly resolving.  Not only did we see on 3/5, she was also seen at urgent care and given antibiotics.  Now complains not of ear pain but of decreased hearing and inability to pop ears.  Likely eustacian tube dysfunction.  Will try afrin for 3 days.  If no improvement and especially if continued decreased hearing, OK to refer to ENT.

## 2011-09-08 NOTE — Telephone Encounter (Signed)
Will forward to pcp for advice.Parris Cudworth Lynetta  

## 2011-09-21 ENCOUNTER — Other Ambulatory Visit: Payer: Self-pay | Admitting: Family Medicine

## 2011-09-21 DIAGNOSIS — G4700 Insomnia, unspecified: Secondary | ICD-10-CM

## 2011-09-22 NOTE — Assessment & Plan Note (Signed)
Refilled per e request 

## 2011-10-14 ENCOUNTER — Telehealth: Payer: Self-pay | Admitting: Family Medicine

## 2011-10-14 DIAGNOSIS — H6980 Other specified disorders of Eustachian tube, unspecified ear: Secondary | ICD-10-CM

## 2011-10-14 NOTE — Telephone Encounter (Signed)
Patient is calling because she was told a number of weeks ago that if she continued to have problems with her ear, she may need to see an ENT.  She would like to move forward with that now.

## 2011-10-15 DIAGNOSIS — H6990 Unspecified Eustachian tube disorder, unspecified ear: Secondary | ICD-10-CM | POA: Insufficient documentation

## 2011-10-15 DIAGNOSIS — H6980 Other specified disorders of Eustachian tube, unspecified ear: Secondary | ICD-10-CM | POA: Insufficient documentation

## 2011-10-15 NOTE — Telephone Encounter (Signed)
Spoke with patient and informed her that I am going to fax off referral to [facility]

## 2011-10-15 NOTE — Assessment & Plan Note (Signed)
Given persistant symptoms, will refer to ENT.

## 2011-10-15 NOTE — Telephone Encounter (Signed)
Are you familiar with this or will patient have to come in before ENT referral will be made

## 2011-10-29 ENCOUNTER — Other Ambulatory Visit: Payer: Self-pay | Admitting: Family Medicine

## 2011-11-05 ENCOUNTER — Other Ambulatory Visit: Payer: Self-pay | Admitting: Family Medicine

## 2011-11-06 ENCOUNTER — Encounter: Payer: Self-pay | Admitting: Family Medicine

## 2011-11-06 ENCOUNTER — Ambulatory Visit (INDEPENDENT_AMBULATORY_CARE_PROVIDER_SITE_OTHER): Payer: Self-pay | Admitting: Family Medicine

## 2011-11-06 VITALS — BP 177/84 | HR 74 | Temp 98.3°F | Ht 62.0 in | Wt 177.6 lb

## 2011-11-06 DIAGNOSIS — E1142 Type 2 diabetes mellitus with diabetic polyneuropathy: Secondary | ICD-10-CM

## 2011-11-06 DIAGNOSIS — I10 Essential (primary) hypertension: Secondary | ICD-10-CM | POA: Diagnosis not present

## 2011-11-06 DIAGNOSIS — Z23 Encounter for immunization: Secondary | ICD-10-CM | POA: Diagnosis not present

## 2011-11-06 DIAGNOSIS — Z780 Asymptomatic menopausal state: Secondary | ICD-10-CM | POA: Insufficient documentation

## 2011-11-06 DIAGNOSIS — E1149 Type 2 diabetes mellitus with other diabetic neurological complication: Secondary | ICD-10-CM

## 2011-11-06 LAB — POCT GLYCOSYLATED HEMOGLOBIN (HGB A1C): Hemoglobin A1C: 9.6

## 2011-11-06 MED ORDER — PNEUMOCOCCAL VAC POLYVALENT 25 MCG/0.5ML IJ INJ: 0.5000 mL | INJECTION | Freq: Once | INTRAMUSCULAR | Status: DC

## 2011-11-06 MED ORDER — LOSARTAN POTASSIUM 100 MG PO TABS: 100.0000 mg | ORAL_TABLET | Freq: Every day | ORAL | Status: DC

## 2011-11-06 NOTE — Progress Notes (Signed)
  Subjective:    Patient ID: Becky Fields, female    DOB: 07-Jan-1946, 66 y.o.   MRN: [id number]  HPI FU diabetes, HBP Patient knows BP is up this visit.  She could not fill valsartin due to cost.  Only taking HCTZ and B blocker A1C markedly higher than 6 months ago.  Has lost two additional pounds.  She admits to dietary indiscretion.  She believes she can do a lot better. Knows she is due for eye exam and actually has an appointment.  Asked her to have ophth send me results of exam.    Review of Systems     Objective:   Physical Exam Lungs clear Cardiac RRR without m abd benign Ext no edema Diabetic foot exam, good pulses, no callous.  Has diminished sensation.        Assessment & Plan:

## 2011-11-06 NOTE — Assessment & Plan Note (Signed)
She vows to get more vigorous about her diet.  She knows the next step is insulin.  FU 2 months.

## 2011-11-06 NOTE — Assessment & Plan Note (Signed)
Will do bone density screen

## 2011-11-06 NOTE — Patient Instructions (Signed)
We need to get you under better control for your blood pressure and diabetes Shop around to find the cheapest price for losartan Work on diet. I want to see you again in 2 months to recheck diabetes.  If not better, insulin Make an appointment for Arlys John for your free annual Medicare exam.  She can help with diet and encourage. Today you will receive a pneumonia vaccine (pneumovax).  You get one of these after age 65 and your done. With your new insurance and find out if they cover the shingles vaccine (zostavax) I will call next week with lab results

## 2011-11-06 NOTE — Assessment & Plan Note (Signed)
Worsened.  Would really like to get this ACE intollerant (cough) diabetic on an ARB.  Will give RX printed and she will shop around.  If she cannot find a place to fill at an affordable price, will use norvasc and screen microalbumin.

## 2011-11-07 LAB — MICROALBUMIN / CREATININE URINE RATIO
Creatinine, Urine: 81.4 mg/dL
Microalb Creat Ratio: 9 mg/g (ref 0.0–30.0)

## 2011-11-09 ENCOUNTER — Ambulatory Visit: Payer: Self-pay | Admitting: Home Health Services

## 2011-11-18 ENCOUNTER — Encounter: Payer: Self-pay | Admitting: Home Health Services

## 2011-11-18 ENCOUNTER — Ambulatory Visit (INDEPENDENT_AMBULATORY_CARE_PROVIDER_SITE_OTHER): Payer: Medicare Other | Admitting: Home Health Services

## 2011-11-18 VITALS — BP 134/81 | HR 76 | Temp 97.7°F | Ht 62.0 in | Wt 176.4 lb

## 2011-11-18 DIAGNOSIS — Z0000 Encounter for general adult medical examination without abnormal findings: Secondary | ICD-10-CM | POA: Diagnosis not present

## 2011-11-18 NOTE — Progress Notes (Signed)
Patient here for annual wellness visit, patient reports: Risk Factors/Conditions needing evaluation or treatment: Pt does not have any risk factors that need evaluation. Home Safety: Pt lives with husband in 1 story home.  Pt reports having smoke detectors. Other Information: Corrective lens: Pt wears daily corrective lens.  Visits eye dr annually, is currently under care of cataracts Dentures: Pt does not have dentures.  Visits dentist annually. Memory: Pt denies memory problems. Patient's Mini Mental Score (recorded in doc. flowsheet): 29  Balance/Gait: Pt does not have any noticeable impariment  Balance Abnormal Patient value  Sitting balance    Sit to stand    Attempts to arise    Immediate standing balance    Standing balance    Nudge    Eyes closed- Romberg    Tandem stance    Back lean    Neck Rotation    360 degree turn    Sitting down     Gait Abnormal Patient value  Initiation of gait    Step length-left    Step length-right    Step height-left    Step height-right    Step symmetry    Step continuity    Path deviation    Trunk movement    Walking stance        Annual Wellness Visit Requirements Recorded Today In  Medical, family, social history Past Medical, Family, Social History Section  Current providers Care team  Current medications Medications  Wt, BP, Ht, BMI Vital signs  Hearing assessment (welcome visit) Hearing/vision  Tobacco, alcohol, illicit drug use History  ADL Nurse Assessment  Depression Screening Nurse Assessment  Cognitive impairment Nurse Assessment  Mini Mental Status Document Flowsheet  Fall Risk Nurse Assessment  Home Safety Progress Note  End of Life Planning (welcome visit) Social Documentation  Medicare preventative services Progress Note  Risk factors/conditions needing evaluation/treatment Progress Note  Personalized health advice Patient Instructions, goals, letter  Diet & Exercise Social Documentation  Emergency  Contact Social Documentation  Seat Belts Social Documentation  Sun exposure/protection Social Documentation    Prevention Plan:   Recommended Medicare Prevention Screenings Women over 65 Test For Frequency Date of Last- BOLD if needed  Breast Cancer 1-2 yrs 1/13  Cervical Cancer 1-3 yrs NI-due to age  Colorectal Cancer 1-10 yrs 7/09  Osteoporosis once Pt has scheduled appt  Cholesterol 5 yrs 2/12  Diabetes yearly 2/12  HIV yearly declined  Influenza Shot yearly 12/12  Pneumonia Shot once 5/13  Zostavax Shot once discussed

## 2011-11-18 NOTE — Patient Instructions (Signed)
1. Consider talking to your pharmacist about getting the shingles vaccine. 2. Focus on eating 3-5 fruits and vegetables a day. Try limiting sweets and breads. 3. Try setting small exercise goals to move more. 4. Continue to follow up with Health Coach, Rosalita Chessman, through weekly phone calls.

## 2011-11-19 ENCOUNTER — Encounter: Payer: Self-pay | Admitting: Home Health Services

## 2011-11-24 DIAGNOSIS — E119 Type 2 diabetes mellitus without complications: Secondary | ICD-10-CM | POA: Diagnosis not present

## 2011-11-27 ENCOUNTER — Telehealth: Payer: Self-pay | Admitting: Home Health Services

## 2011-11-27 NOTE — Telephone Encounter (Signed)
Noted and very pleased with this positive improvement in lifestyle.

## 2011-11-27 NOTE — Telephone Encounter (Signed)
Spoke with [employer].  Pt reports taking medications as prescribed.  Pt reported systolic bp at 118 this week.  She was excited because she felt this was the lowest it has been in a while.  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  Waiting to hear back.  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  Pt also reported cutting back on snacking and has started substituting yogurt for crackers/chips.  She also reported stop buying diet coke and is focusing on drinking more water.  Pt expressed continued motivation for change and is working together with her boss who is also working on weight loss.  Pt's goal this week is to continue to limit snacking and/or making healthier choices.  Pt will also walk 2x this week.  Pt's overall goal is weight loss, htn management.

## 2011-11-28 ENCOUNTER — Other Ambulatory Visit: Payer: Self-pay | Admitting: Family Medicine

## 2011-12-01 ENCOUNTER — Telehealth: Payer: Self-pay | Admitting: Family Medicine

## 2011-12-01 DIAGNOSIS — Z13820 Encounter for screening for osteoporosis: Secondary | ICD-10-CM

## 2011-12-01 NOTE — Assessment & Plan Note (Signed)
66 and will order screening bone density

## 2011-12-01 NOTE — Telephone Encounter (Signed)
[facility] have [facility].

## 2011-12-02 NOTE — Telephone Encounter (Signed)
Called pt and she informed me that she attempted to make the appt but the order for this was not in. I told her that he has since placed this and asked if she would like for me to make this appt for her and she declined and told me that she would call to schedule this herself.Loralee Pacas [place]

## 2011-12-04 ENCOUNTER — Ambulatory Visit: Payer: Medicare Other | Admitting: *Deleted

## 2011-12-04 VITALS — BP 148/80 | HR 72

## 2011-12-04 DIAGNOSIS — I10 Essential (primary) hypertension: Secondary | ICD-10-CM

## 2011-12-04 NOTE — Progress Notes (Signed)
Patient in today for BP check.  BP checked manually using regular adult cuff.  BP LA 146/88 and RA 148/80 pulse 72. Patient reports she is taking meds as directed.  . States she has scheduled a Bone Density scan and appointment with eye doctor. Will send message to Dr. Leveda Anna.

## 2011-12-04 NOTE — Assessment & Plan Note (Signed)
See note

## 2011-12-04 NOTE — Progress Notes (Signed)
Patient ID: Becky Fields, female   DOB: 1945-12-01, 66 y.o.   MRN: [id number] For now, focus on life style modifications.  She should continue to work with Arlys John on diet and exercise.  I will see in 1-2 months and adjust meds at that time if BP still up.

## 2011-12-09 NOTE — Progress Notes (Signed)
Patient was notified of this message

## 2011-12-10 ENCOUNTER — Telehealth: Payer: Self-pay | Admitting: Home Health Services

## 2011-12-10 NOTE — Telephone Encounter (Signed)
Spoke with [employer].  Pt reports feeling okay.  Pt not taking bp at home.  Pt reports not drinking soda for the past week.    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  This would be 15 minutes 3-4 times a week.   Pt continues to report motivation for change and appreciate the weekly encouragement.  Will follow up with health coach weekly for continued weight loss support.

## 2011-12-14 ENCOUNTER — Other Ambulatory Visit: Payer: Medicare Other

## 2011-12-18 ENCOUNTER — Telehealth: Payer: Self-pay | Admitting: Home Health Services

## 2011-12-18 NOTE — Telephone Encounter (Signed)
Spoke with [employer].  Pt reports taking medications as prescribed without missing any days.  Pt not currently monitoring bp at home.  Pt reported walking 3x for 15-18 minutes this past week.  Pt reported drinking soda 1-2x this past week.  (improved from daily soda consumption)  Pt set goal for this next week to walk 3x for at least 20 minutes.  To continue reducing soda intake, increasing vegetables at lunch and dinner, take bp at least once this next week.  Pt's overall goal is weight loss, htn management.

## 2011-12-21 ENCOUNTER — Ambulatory Visit
Admission: RE | Admit: 2011-12-21 | Discharge: 2011-12-21 | Disposition: A | Discharge status: Home / Self Care | Payer: Medicare Other | Source: Ambulatory Visit | Attending: Family Medicine | Admitting: Family Medicine

## 2011-12-21 DIAGNOSIS — E1149 Type 2 diabetes mellitus with other diabetic neurological complication: Secondary | ICD-10-CM

## 2011-12-21 DIAGNOSIS — Z780 Asymptomatic menopausal state: Secondary | ICD-10-CM | POA: Diagnosis not present

## 2011-12-21 DIAGNOSIS — M899 Disorder of bone, unspecified: Secondary | ICD-10-CM | POA: Diagnosis not present

## 2011-12-21 DIAGNOSIS — M949 Disorder of cartilage, unspecified: Secondary | ICD-10-CM | POA: Diagnosis not present

## 2011-12-28 ENCOUNTER — Encounter: Payer: Self-pay | Admitting: Family Medicine

## 2012-01-07 ENCOUNTER — Telehealth: Payer: Self-pay | Admitting: Home Health Services

## 2012-01-07 NOTE — Telephone Encounter (Signed)
Spoke with [employer].  Pt reports taking medications as prescribe without missing any days.  Pt is not self-monitoring bp.  Pt reports trying to walk 3-4 times a week, but has struggled this week to get it in.   Pt reports continued hunger and snacking and tries to make healthy choices such as fruit when she snacks.  Pt set new goal to walk dog 2-3 times this next week in the morning.  Pt's overall goal is weight loss, bp management.

## 2012-01-09 ENCOUNTER — Other Ambulatory Visit: Payer: Self-pay | Admitting: Family Medicine

## 2012-01-15 ENCOUNTER — Telehealth: Payer: Self-pay | Admitting: Home Health Services

## 2012-01-15 NOTE — Telephone Encounter (Signed)
Spoke with [employer].  Pt reports taking medications without missing any days.  Pt reported at home blood pressure readings at 114/55 and 135/75.  Pt reported not exercising this week, but now has a walking buddy to be accountable too.  Pt reported still snacking but is trying to be mindful about how much.  Pt's overall goal is htn management, weight loss.

## 2012-01-18 DIAGNOSIS — H18419 Arcus senilis, unspecified eye: Secondary | ICD-10-CM | POA: Diagnosis not present

## 2012-01-18 DIAGNOSIS — H2510 Age-related nuclear cataract, unspecified eye: Secondary | ICD-10-CM | POA: Diagnosis not present

## 2012-01-28 ENCOUNTER — Telehealth: Payer: Self-pay | Admitting: Home Health Services

## 2012-01-28 NOTE — Telephone Encounter (Signed)
Spoke with [employer].  Pt reports taking medications daily without missing any days.  Pt reports fasting blood sugar ranges 130-165.  At home bp reading 131/70.  Pt reports not exercising this past week- has been dog sitting for two families.  Pt reports having gained some weight and realized she needs to do things differently.  Pt set goal to start tracking food and to fit in walking this next week.   Continue taking bp/bs weekly.  Pt's overall goal is bp management, weight loss.

## 2012-02-15 DIAGNOSIS — H269 Unspecified cataract: Secondary | ICD-10-CM | POA: Diagnosis not present

## 2012-02-15 DIAGNOSIS — H2510 Age-related nuclear cataract, unspecified eye: Secondary | ICD-10-CM | POA: Diagnosis not present

## 2012-02-16 DIAGNOSIS — H2510 Age-related nuclear cataract, unspecified eye: Secondary | ICD-10-CM | POA: Diagnosis not present

## 2012-02-21 HISTORY — PX: EYE SURGERY: SHX253

## 2012-02-29 ENCOUNTER — Telehealth: Payer: Self-pay | Admitting: Family Medicine

## 2012-02-29 NOTE — Telephone Encounter (Signed)
Becky Fields returned your call but you were not available.  Please call back at earliest convenience.

## 2012-02-29 NOTE — Telephone Encounter (Signed)
Called pt back, left message

## 2012-03-14 DIAGNOSIS — H269 Unspecified cataract: Secondary | ICD-10-CM | POA: Diagnosis not present

## 2012-03-14 DIAGNOSIS — H2510 Age-related nuclear cataract, unspecified eye: Secondary | ICD-10-CM | POA: Diagnosis not present

## 2012-05-02 ENCOUNTER — Encounter: Payer: Self-pay | Admitting: Home Health Services

## 2012-05-04 ENCOUNTER — Encounter: Payer: Self-pay | Admitting: Home Health Services

## 2012-05-13 ENCOUNTER — Encounter: Payer: Self-pay | Admitting: Family Medicine

## 2012-05-13 ENCOUNTER — Ambulatory Visit (INDEPENDENT_AMBULATORY_CARE_PROVIDER_SITE_OTHER): Payer: Medicare Other | Admitting: Family Medicine

## 2012-05-13 VITALS — BP 193/86 | HR 72 | Temp 98.6°F | Ht 62.0 in | Wt 179.0 lb

## 2012-05-13 DIAGNOSIS — E781 Pure hyperglyceridemia: Secondary | ICD-10-CM | POA: Diagnosis not present

## 2012-05-13 DIAGNOSIS — E119 Type 2 diabetes mellitus without complications: Secondary | ICD-10-CM

## 2012-05-13 DIAGNOSIS — IMO0001 Reserved for inherently not codable concepts without codable children: Secondary | ICD-10-CM | POA: Diagnosis not present

## 2012-05-13 DIAGNOSIS — E1149 Type 2 diabetes mellitus with other diabetic neurological complication: Secondary | ICD-10-CM | POA: Diagnosis not present

## 2012-05-13 DIAGNOSIS — I10 Essential (primary) hypertension: Secondary | ICD-10-CM

## 2012-05-13 DIAGNOSIS — Z23 Encounter for immunization: Secondary | ICD-10-CM | POA: Diagnosis not present

## 2012-05-13 DIAGNOSIS — E1165 Type 2 diabetes mellitus with hyperglycemia: Secondary | ICD-10-CM

## 2012-05-13 MED ORDER — AMLODIPINE BESYLATE 10 MG PO TABS: 10.0000 mg | ORAL_TABLET | Freq: Every day | ORAL | Status: DC

## 2012-05-13 NOTE — Assessment & Plan Note (Signed)
Poor control, add amlodipine. 

## 2012-05-13 NOTE — Assessment & Plan Note (Signed)
Order fasting future labs

## 2012-05-13 NOTE — Assessment & Plan Note (Signed)
Much improved A1C.  Same meds.  More exercise

## 2012-05-13 NOTE — Patient Instructions (Addendum)
Please double check when you get home that you are actually taking hydrochlorothiazide.   Please try benadryl at night for arm itching.  Also my favorite moisturizer is Eucerin Cream.   I am going to send in a prescription for a new blood pressure medicine.  If you have not been taking the HCTZ, please start and don't fill the new prescription.  If you have been taking the HCTZ, make sure you start on this new med. See me in one month to recheck your blood pressure Get FASTING blood work done before seeing me.  OK to wait a couple of weeks to get back on your fish oil Great job with the diabetes. Sign up for My Chart

## 2012-05-13 NOTE — Progress Notes (Signed)
  Subjective:    Patient ID: Becky Fields, female    DOB: 05-Sep-1945, 66 y.o.   MRN: [id number]  HPI In for general check up.  Did have recent viral illness and this fall had cataract surg.  She has not been as good with her diet and exercise with these blips. Taking all meds - except not sure about HCTZ.  Will check.  Home blood pressures have been running a bit high.   Blood sugars are running 150-160 non fasting.      Review of Systems Denies CP, SOB or neuro sx.     Objective:   Physical Exam Lungs clear Cardiac RRR without m or g       Assessment & Plan:

## 2012-05-30 ENCOUNTER — Other Ambulatory Visit: Payer: Self-pay | Admitting: Family Medicine

## 2012-05-31 ENCOUNTER — Other Ambulatory Visit: Payer: Medicare Other

## 2012-05-31 DIAGNOSIS — E781 Pure hyperglyceridemia: Secondary | ICD-10-CM

## 2012-05-31 DIAGNOSIS — I10 Essential (primary) hypertension: Secondary | ICD-10-CM

## 2012-05-31 LAB — COMPLETE METABOLIC PANEL WITH GFR
AST: 85 U/L — ABNORMAL HIGH (ref 0–37)
Albumin: 3.8 g/dL (ref 3.5–5.2)
BUN: 27 mg/dL — ABNORMAL HIGH (ref 6–23)
Calcium: 9.1 mg/dL (ref 8.4–10.5)
Chloride: 99 mEq/L (ref 96–112)
Creat: 0.63 mg/dL (ref 0.50–1.10)
GFR, Est Non African American: >89 mL/min
Glucose, Bld: 160 mg/dL — ABNORMAL HIGH (ref 70–99)
Potassium: 4.2 mEq/L (ref 3.5–5.3)

## 2012-05-31 LAB — LIPID PANEL
Cholesterol: 171 mg/dL (ref 0–200)
Triglycerides: 488 mg/dL — ABNORMAL HIGH (ref <150)

## 2012-05-31 NOTE — Progress Notes (Signed)
CMP AND FLP DONE TODAY Becky Fields 

## 2012-06-01 NOTE — Progress Notes (Signed)
Patient ID: Becky Fields, female   DOB: [birth year], 66 y.o.   MRN: [id number] Called and left message that results were OK meaning no urgent action needed.  I still want to see her and make sure chronic problems coming under control and will go over results at that time.

## 2012-06-16 ENCOUNTER — Ambulatory Visit (INDEPENDENT_AMBULATORY_CARE_PROVIDER_SITE_OTHER): Payer: Medicare Other | Admitting: Family Medicine

## 2012-06-16 ENCOUNTER — Encounter: Payer: Self-pay | Admitting: Family Medicine

## 2012-06-16 VITALS — BP 127/56 | HR 73 | Temp 98.1°F | Ht 62.0 in | Wt 180.7 lb

## 2012-06-16 DIAGNOSIS — E781 Pure hyperglyceridemia: Secondary | ICD-10-CM

## 2012-06-16 DIAGNOSIS — I10 Essential (primary) hypertension: Secondary | ICD-10-CM

## 2012-06-16 DIAGNOSIS — B1710 Acute hepatitis C without hepatic coma: Secondary | ICD-10-CM

## 2012-06-16 NOTE — Patient Instructions (Addendum)
Remember to work hard on the weight loss.  We will recheck your liver and discuss referring you to the liver specialists in the spring. Get back on the fish oil.  It helps lower your triglycerides.

## 2012-06-17 NOTE — Assessment & Plan Note (Signed)
Fish oil and wt loss.  Recheck in 3-6 months.

## 2012-06-17 NOTE — Assessment & Plan Note (Signed)
Restart fish oil.  She plans to get very serious about wt loss.  Will remeasure LFTs in the spring.  Consider referral to hep clinic at that time.

## 2012-06-17 NOTE — Progress Notes (Signed)
  Subjective:    Patient ID: Becky Fields, female    DOB: 09/30/1945, 67 y.o.   MRN: [id number]  HPI FU hypertension and abnormal lab work. Hypertension.  She found she was not taking HCTZ, restarted it and now has normal BPs Hypertriglyceridemia is an old problem.  She is below 500 (barely).  Discussed.  She intends to restart her fish oil which she has not been taking and get serious about diet. Increased LFTs:  We have discussed her chronic hep C and transaminase elevation in past.  Slightly higher now.  No evidence of bili elevation or decreased albumen.  Uncertain if this is the hep C, the high triglycerides or perhaps steatohepatitis.  See plan   Review of Systems     Objective:   Physical ExamLungs clear Cardiac RRR without m Abd benign        Assessment & Plan:

## 2012-06-17 NOTE — Assessment & Plan Note (Signed)
Much improved control 

## 2012-09-07 ENCOUNTER — Encounter: Payer: Self-pay | Admitting: *Deleted

## 2012-09-19 ENCOUNTER — Other Ambulatory Visit: Payer: Self-pay | Admitting: Family Medicine

## 2012-10-10 ENCOUNTER — Encounter: Payer: Self-pay | Admitting: *Deleted

## 2012-10-16 ENCOUNTER — Emergency Department (HOSPITAL_COMMUNITY): Payer: Medicare Other

## 2012-10-16 ENCOUNTER — Emergency Department (INDEPENDENT_AMBULATORY_CARE_PROVIDER_SITE_OTHER)
Admission: EM | Admit: 2012-10-16 | Discharge: 2012-10-16 | Disposition: A | Discharge status: Nursing Home (Includes ICF) | Payer: Medicare Other | Source: Home / Self Care | Attending: Family Medicine | Admitting: Family Medicine

## 2012-10-16 ENCOUNTER — Encounter (HOSPITAL_COMMUNITY): Payer: Self-pay | Admitting: Emergency Medicine

## 2012-10-16 ENCOUNTER — Inpatient Hospital Stay (HOSPITAL_COMMUNITY)
Admission: EM | Admit: 2012-10-16 | Discharge: 2012-10-19 | DRG: 281 | Disposition: A | Discharge status: Home / Self Care | Payer: Medicare Other | Attending: Cardiology | Admitting: Cardiology

## 2012-10-16 DIAGNOSIS — I10 Essential (primary) hypertension: Secondary | ICD-10-CM | POA: Diagnosis present

## 2012-10-16 DIAGNOSIS — E871 Hypo-osmolality and hyponatremia: Secondary | ICD-10-CM

## 2012-10-16 DIAGNOSIS — Z7982 Long term (current) use of aspirin: Secondary | ICD-10-CM | POA: Diagnosis not present

## 2012-10-16 DIAGNOSIS — I498 Other specified cardiac arrhythmias: Secondary | ICD-10-CM | POA: Diagnosis present

## 2012-10-16 DIAGNOSIS — E781 Pure hyperglyceridemia: Secondary | ICD-10-CM | POA: Diagnosis present

## 2012-10-16 DIAGNOSIS — R002 Palpitations: Secondary | ICD-10-CM | POA: Diagnosis not present

## 2012-10-16 DIAGNOSIS — I5181 Takotsubo syndrome: Secondary | ICD-10-CM | POA: Diagnosis not present

## 2012-10-16 DIAGNOSIS — D696 Thrombocytopenia, unspecified: Secondary | ICD-10-CM | POA: Diagnosis present

## 2012-10-16 DIAGNOSIS — I340 Nonrheumatic mitral (valve) insufficiency: Secondary | ICD-10-CM | POA: Diagnosis present

## 2012-10-16 DIAGNOSIS — E1142 Type 2 diabetes mellitus with diabetic polyneuropathy: Secondary | ICD-10-CM | POA: Diagnosis present

## 2012-10-16 DIAGNOSIS — E1165 Type 2 diabetes mellitus with hyperglycemia: Secondary | ICD-10-CM

## 2012-10-16 DIAGNOSIS — Z888 Allergy status to other drugs, medicaments and biological substances status: Secondary | ICD-10-CM | POA: Diagnosis not present

## 2012-10-16 DIAGNOSIS — Z882 Allergy status to sulfonamides status: Secondary | ICD-10-CM | POA: Diagnosis not present

## 2012-10-16 DIAGNOSIS — I208 Other forms of angina pectoris: Secondary | ICD-10-CM

## 2012-10-16 DIAGNOSIS — Z8249 Family history of ischemic heart disease and other diseases of the circulatory system: Secondary | ICD-10-CM | POA: Diagnosis not present

## 2012-10-16 DIAGNOSIS — Z79899 Other long term (current) drug therapy: Secondary | ICD-10-CM

## 2012-10-16 DIAGNOSIS — R0789 Other chest pain: Secondary | ICD-10-CM | POA: Diagnosis not present

## 2012-10-16 DIAGNOSIS — E1149 Type 2 diabetes mellitus with other diabetic neurological complication: Secondary | ICD-10-CM | POA: Diagnosis present

## 2012-10-16 DIAGNOSIS — IMO0002 Reserved for concepts with insufficient information to code with codable children: Secondary | ICD-10-CM | POA: Diagnosis present

## 2012-10-16 DIAGNOSIS — R072 Precordial pain: Secondary | ICD-10-CM | POA: Diagnosis not present

## 2012-10-16 DIAGNOSIS — I214 Non-ST elevation (NSTEMI) myocardial infarction: Secondary | ICD-10-CM

## 2012-10-16 DIAGNOSIS — R110 Nausea: Secondary | ICD-10-CM | POA: Diagnosis not present

## 2012-10-16 DIAGNOSIS — I2510 Atherosclerotic heart disease of native coronary artery without angina pectoris: Secondary | ICD-10-CM | POA: Diagnosis not present

## 2012-10-16 DIAGNOSIS — E785 Hyperlipidemia, unspecified: Secondary | ICD-10-CM | POA: Diagnosis present

## 2012-10-16 DIAGNOSIS — I059 Rheumatic mitral valve disease, unspecified: Secondary | ICD-10-CM | POA: Diagnosis present

## 2012-10-16 DIAGNOSIS — B1920 Unspecified viral hepatitis C without hepatic coma: Secondary | ICD-10-CM | POA: Diagnosis present

## 2012-10-16 HISTORY — DX: Pure hyperglyceridemia: E78.1

## 2012-10-16 HISTORY — DX: Unspecified viral hepatitis C without hepatic coma: B19.20

## 2012-10-16 LAB — CBC
HCT: 34.9 % — ABNORMAL LOW (ref 36.0–46.0)
MCHC: 36.1 g/dL — ABNORMAL HIGH (ref 30.0–36.0)
RDW: 11.8 % (ref 11.5–15.5)
WBC: 7.1 10*3/uL (ref 4.0–10.5)

## 2012-10-16 LAB — COMPREHENSIVE METABOLIC PANEL
ALT: 84 U/L — ABNORMAL HIGH (ref 0–35)
AST: 91 U/L — ABNORMAL HIGH (ref 0–37)
Albumin: 3 g/dL — ABNORMAL LOW (ref 3.5–5.2)
Alkaline Phosphatase: 76 U/L (ref 39–117)
BUN: 38 mg/dL — ABNORMAL HIGH (ref 6–23)
Chloride: 91 mEq/L — ABNORMAL LOW (ref 96–112)
Potassium: 4.6 mEq/L (ref 3.5–5.1)
Sodium: 124 mEq/L — ABNORMAL LOW (ref 135–145)
Total Bilirubin: 0.9 mg/dL (ref 0.3–1.2)
Total Protein: 7 g/dL (ref 6.0–8.3)

## 2012-10-16 LAB — PROTIME-INR: Prothrombin Time: 14.5 seconds (ref 11.6–15.2)

## 2012-10-16 LAB — URINALYSIS, ROUTINE W REFLEX MICROSCOPIC
Glucose, UA: NEGATIVE mg/dL
Hgb urine dipstick: NEGATIVE
Ketones, ur: 15 mg/dL — AB
Protein, ur: NEGATIVE mg/dL
pH: 5 (ref 5.0–8.0)

## 2012-10-16 LAB — URINE MICROSCOPIC-ADD ON

## 2012-10-16 LAB — GLUCOSE, CAPILLARY: Glucose-Capillary: 167 mg/dL — ABNORMAL HIGH (ref 70–99)

## 2012-10-16 LAB — POCT I-STAT TROPONIN I: Troponin i, poc: 3.74 ng/mL (ref 0.00–0.08)

## 2012-10-16 MED ORDER — ASPIRIN 81 MG PO CHEW
CHEWABLE_TABLET | ORAL | Status: AC
  Filled 2012-10-16: qty 3

## 2012-10-16 MED ORDER — DIAZEPAM 5 MG PO TABS
5.0000 mg | ORAL_TABLET | ORAL | Status: AC
  Administered 2012-10-17: 5 mg via ORAL
  Filled 2012-10-16: qty 1

## 2012-10-16 MED ORDER — SODIUM CHLORIDE 0.9 % IJ SOLN: 3.0000 mL | Freq: Two times a day (BID) | INTRAMUSCULAR | Status: DC

## 2012-10-16 MED ORDER — SODIUM CHLORIDE 0.9 % IV BOLUS (SEPSIS)
1000.0000 mL | Freq: Once | INTRAVENOUS | Status: AC
  Administered 2012-10-16: 1000 mL via INTRAVENOUS

## 2012-10-16 MED ORDER — ACETAMINOPHEN 325 MG PO TABS
650.0000 mg | ORAL_TABLET | ORAL | Status: DC | PRN
  Administered 2012-10-19: 650 mg via ORAL
  Filled 2012-10-16: qty 2

## 2012-10-16 MED ORDER — SODIUM CHLORIDE 0.9 % IJ SOLN: 3.0000 mL | INTRAMUSCULAR | Status: DC | PRN

## 2012-10-16 MED ORDER — ONDANSETRON HCL 4 MG/2ML IJ SOLN: 4.0000 mg | Freq: Four times a day (QID) | INTRAMUSCULAR | Status: DC | PRN

## 2012-10-16 MED ORDER — ASPIRIN 81 MG PO TABS: 81.0000 mg | ORAL_TABLET | Freq: Every day | ORAL | Status: DC

## 2012-10-16 MED ORDER — SODIUM CHLORIDE 0.9 % IV SOLN
Freq: Once | INTRAVENOUS | Status: AC
  Administered 2012-10-16: 13:00:00 via INTRAVENOUS

## 2012-10-16 MED ORDER — NITROGLYCERIN 0.4 MG SL SUBL: 0.4000 mg | SUBLINGUAL_TABLET | SUBLINGUAL | Status: DC | PRN

## 2012-10-16 MED ORDER — ATORVASTATIN CALCIUM 80 MG PO TABS
80.0000 mg | ORAL_TABLET | Freq: Every day | ORAL | Status: DC
  Administered 2012-10-18: 80 mg via ORAL
  Filled 2012-10-16 (×3): qty 1

## 2012-10-16 MED ORDER — ASPIRIN EC 81 MG PO TBEC
81.0000 mg | DELAYED_RELEASE_TABLET | Freq: Every day | ORAL | Status: DC
  Administered 2012-10-18 – 2012-10-19 (×2): 81 mg via ORAL
  Filled 2012-10-16 (×2): qty 1

## 2012-10-16 MED ORDER — ZOLPIDEM TARTRATE 5 MG PO TABS: 5.0000 mg | ORAL_TABLET | Freq: Every evening | ORAL | Status: DC | PRN

## 2012-10-16 MED ORDER — HEPARIN BOLUS VIA INFUSION
2000.0000 [IU] | Freq: Once | INTRAVENOUS | Status: AC
  Administered 2012-10-16: 2000 [IU] via INTRAVENOUS
  Filled 2012-10-16: qty 2000

## 2012-10-16 MED ORDER — ASPIRIN 81 MG PO CHEW
324.0000 mg | CHEWABLE_TABLET | ORAL | Status: AC
  Administered 2012-10-17: 324 mg via ORAL
  Filled 2012-10-16: qty 4

## 2012-10-16 MED ORDER — INSULIN ASPART 100 UNIT/ML ~~LOC~~ SOLN
0.0000 [IU] | Freq: Three times a day (TID) | SUBCUTANEOUS | Status: DC
  Administered 2012-10-17: 3 [IU] via SUBCUTANEOUS
  Administered 2012-10-17: 2 [IU] via SUBCUTANEOUS
  Administered 2012-10-18: 3 [IU] via SUBCUTANEOUS
  Administered 2012-10-18: 1 [IU] via SUBCUTANEOUS
  Administered 2012-10-18: 2 [IU] via SUBCUTANEOUS
  Administered 2012-10-19: 1 [IU] via SUBCUTANEOUS
  Administered 2012-10-19: 3 [IU] via SUBCUTANEOUS

## 2012-10-16 MED ORDER — ALPRAZOLAM 0.25 MG PO TABS
0.2500 mg | ORAL_TABLET | Freq: Two times a day (BID) | ORAL | Status: DC | PRN
  Administered 2012-10-16: 0.25 mg via ORAL
  Filled 2012-10-16: qty 1

## 2012-10-16 MED ORDER — SODIUM CHLORIDE 0.9 % IV SOLN: 250.0000 mL | INTRAVENOUS | Status: DC | PRN

## 2012-10-16 MED ORDER — HEPARIN (PORCINE) IN NACL 100-0.45 UNIT/ML-% IJ SOLN
1400.0000 [IU]/h | INTRAMUSCULAR | Status: DC
  Administered 2012-10-16: 850 [IU]/h via INTRAVENOUS
  Administered 2012-10-17: 1400 [IU]/h via INTRAVENOUS
  Filled 2012-10-16 (×3): qty 250

## 2012-10-16 MED ORDER — HEPARIN BOLUS VIA INFUSION
4000.0000 [IU] | Freq: Once | INTRAVENOUS | Status: AC
  Administered 2012-10-16: 4000 [IU] via INTRAVENOUS

## 2012-10-16 MED ORDER — SODIUM CHLORIDE 0.9 % IJ SOLN
3.0000 mL | Freq: Two times a day (BID) | INTRAMUSCULAR | Status: DC
  Administered 2012-10-18 – 2012-10-19 (×3): 3 mL via INTRAVENOUS

## 2012-10-16 MED ORDER — ASPIRIN 81 MG PO CHEW
238.0000 mg | CHEWABLE_TABLET | Freq: Once | ORAL | Status: AC
  Administered 2012-10-16: 243 mg via ORAL

## 2012-10-16 MED ORDER — ASPIRIN 81 MG PO CHEW: 324.0000 mg | CHEWABLE_TABLET | Freq: Once | ORAL | Status: DC

## 2012-10-16 MED ORDER — LORAZEPAM 2 MG/ML IJ SOLN
0.5000 mg | Freq: Once | INTRAMUSCULAR | Status: AC
  Administered 2012-10-16: 0.5 mg via INTRAVENOUS
  Filled 2012-10-16: qty 1

## 2012-10-16 NOTE — ED Notes (Signed)
Pt reports taking 81mg  ASA this morning.  Denies any chest pain/heaviness @ this time.  Sinus rhythm per monitor without ectopy.  HR 80s.

## 2012-10-16 NOTE — ED Notes (Signed)
Pt denies any cp at this time. States she began having some discomfort and pressure in her central chest, palp, dizziness, sob, and nausea that started last night. Pt states she is also had some neck stiffness when she woke up this morning. Pt has DM and states her sugar has been high for the past few days.

## 2012-10-16 NOTE — ED Notes (Addendum)
Assessed pt at request of registration assoc.  Pt c/o now light sensitivity and a little weakness. Symptoms beginning yesterday were sweating, weakness, and heart palpitations while watching a movie at a movie theater.  After the movie, pt measured her own BP, and states that it was high (and has been somewhat high for about a week). Pt still feels a little week today, and would like to be seen to address these symptoms.

## 2012-10-16 NOTE — ED Notes (Signed)
Pt c/o palpitation and feeling clammy along with nausea and having spotty vision./sensitivety to light.  Pt states that last night was unable to get comfortable "felt like something sitting on chest" Pt states blood sugars have been elevated all week 250 and up

## 2012-10-16 NOTE — ED Provider Notes (Addendum)
35 her old female, history of diabetes and hypertension who presents with a complaint of chest discomfort over night. Yesterday at the movie theater she had felt clammy and diaphoretic, became nauseated and had some bitemporal pain. This gradually eased off but overnight she developed some lower sternal chest pressure. This has resolved as well but was present for several hours this morning. At this time the patient states that she feels better and has no specific symptoms at this time. She was sent from the urgent care to the emergency department because of T wave abnormalities on her EKG or new. On my exam the patient has clear heart and lung sounds, no murmurs, soft abdomen with no peripheral edema. Her EKG does show T-wave inversions in the inferior leads as well as the lateral precordial leads. There is some concern that this could be unstable angina or an acute coronary syndrome. Laboratory data pending, chest x-ray pending, anticipate admission to the hospital.  The pt has elevated trop, I have personally discussed her care with Dr. Myrtis Ser of Cardiology who will come to see the pt.  Heparin has been ordered, pt appears to have slight hypotension at this time.  No arrhythmias, no bradycardia and normal mental status.    Critical care being delivered.  Medical screening examination/treatment/procedure(s) were conducted as a shared visit with non-physician practitioner(s) and myself.  I personally evaluated the patient during the encounter    Vida Roller, MD 10/16/12 1402  Vida Roller, MD 10/17/12 [phone]

## 2012-10-16 NOTE — ED Notes (Signed)
Pt comes from [facility] with c/o cp that radiates to back, and neck stiffness. Pt received 3 baby aspirin at UC, and 1 at home. Pt rates neck pain 5/10, denies any cp at this time. Vitals 111/81, 82 HR, 97-98% 2L 20g RAC, NS KVO.

## 2012-10-16 NOTE — Progress Notes (Signed)
TC to Hurman Horn PA made aware of critical Troponin b/p 95/61 HR 90 will cont to monitor no new orders at this time.

## 2012-10-16 NOTE — H&P (Signed)
History and Physical   Patient ID: Becky Fields MRN: [id number], DOB/AGE: [birth year]   Admit date: 10/16/2012 Date of Consult: 10/16/2012   Primary Physician: Sanjuana Letters, MD Primary Cardiologist: New- evaluated by Dr. Myrtis Ser  HPI: Becky Fields is a 67 y.o. female with no prior cardiac history, and PMHx s/f hepatitis C, DM2 with diabetic neuropathy, hypertriglyceridemia and HTN who presents to [facility] ED today with overnight chest discomfort.   She reports being in her USOH until yesterday evening. She began to develop a headache and unexplained diaphoresis, but no chest discomfort. Through the night, she was awoken several times by episodes of substernal chest pressure radiating to her neck at times with associated shortness of breath and diaphoresis lasting for several minutes at a time rated at a 7-8/10. No prior episodes of chest pain. No worsening DOE, PND, orthopnea, edema, palpitations, lightheadedness or syncope. She awoke this morning with persistent discomfort and pain in her neck. She presented to the urgent care and was advised to come to the ED.  In the ED, EKG reveals new diffuse TWIs, < 1 mm ST elevation I, aVL when compared to prior 2002 tracing. POC trop-I 3.74. CBC- PLT 106. Portable CXR- suspected mild pulmonary venous congestion, low volumes. BMET pending. BP 96/47, otherwise VSS. Pain free currently. Heparin ordered.   Problem List: Past Medical History  Diagnosis Date  . Diabetes mellitus   . Hypertension   . Cataract   . Hepatitis C   . Hypertriglyceridemia     Past Surgical History  Procedure Laterality Date  . Cholecystectomy    . Eye surgery  02/2012    bilateral cataract surg     Allergies:  Allergies  Allergen Reactions  . Lisinopril     REACTION: causes cough  . Sulfa Antibiotics     Told as a child allergic    Home Medications: Prior to Admission medications   Medication Sig Start Date End Date Taking? Authorizing Provider  aspirin  81 MG tablet Take 81 mg by mouth daily.     Yes Historical Provider, MD  glimepiride (AMARYL) 4 MG tablet TAKE TWO TABLETS BY MOUTH EVERY DAY BEFORE  BREAKFAST 11/05/11  Yes Sanjuana Letters, MD  hydrochlorothiazide (HYDRODIURIL) 25 MG tablet TAKE ONE TABLET BY MOUTH DAILY 10/29/11  Yes Sanjuana Letters, MD  loratadine (CLARITIN) 10 MG tablet TAKE ONE TABLET BY MOUTH DAILY  AS NEEDED ALLERGY 01/09/12  Yes Sanjuana Letters, MD  losartan (COZAAR) 100 MG tablet Take 1 tablet (100 mg total) by mouth daily. 11/06/11 11/05/12 Yes Sanjuana Letters, MD  metFORMIN (GLUCOPHAGE) 1000 MG tablet TAKE ONE TABLET BY MOUTH TWICE DAILY 09/19/12  Yes Sanjuana Letters, MD  metoprolol (LOPRESSOR) 50 MG tablet TAKE ONE TABLET BY MOUTH TWICE DAILY 11/28/11  Yes Sanjuana Letters, MD  MILK THISTLE PO Take 1 tablet by mouth daily.   Yes Historical Provider, MD  Multiple Vitamin (MULTIVITAMIN WITH MINERALS) TABS Take 1 tablet by mouth daily.   Yes Historical Provider, MD  Polyethyl Glycol-Propyl Glycol (SYSTANE OP) Apply 1 drop to eye 2 (two) times daily as needed (dry eyes).   Yes Historical Provider, MD  zolpidem (AMBIEN) 10 MG tablet TAKE ONE TABLET BY MOUTH AT BEDTIME AS NEEDED FOR SLEEP 05/30/12  Yes Sanjuana Letters, MD    Inpatient Medications:  . aspirin  324 mg Oral Once  . LORazepam  0.5 mg Intravenous Once    (Not in a hospital admission)  Family  History  Problem Relation Age of Onset  . Aneurysm Father   . CAD Brother 55     History   Social History  . Marital Status: Married    Spouse Name: Ron    Number of Children: 2  . Years of Education: Bachelors   Occupational History  . office assistant    Social History Main Topics  . Smoking status: Never Smoker   . Smokeless tobacco: Never Used  . Alcohol Use: 3.0 oz/week    6 drink(s) per week     Comment: Occasional glass of wine on the weekends  . Drug Use: No  . Sexually Active: Not Currently   Other Topics  Concern  . Not on file   Social History Narrative   Health Care POA:    Emergency Contact: husband, Enijah Furr (c) [phone]   End of Life Plan:    Who lives with you: husband, Ron, occasionally son who works out of town   Any pets: 2 dogs, 2 cats   Diet: Pt has a varied diet of protein, starch, vegetables.  Pt is not currently on any kind of diet modifications for DM control.   Exercise: Pt has not regular exercise routine.   Seatbelts: Pt reports wearing seatbelt when in vehicles.    Sun Exposure/Protection: Pt reports not wearing sun protection.   Hobbies: Spending time with granddaughter, reading          Review of Systems: General: positive for diaphoresis, negative for chills, fever or weight changes.  Cardiovascular: positive for chest pain and shortness of breath, negative for chest pain, dyspnea on exertion, edema, orthopnea, palpitations, paroxysmal nocturnal dyspnea  Dermatological: negative for rash Respiratory:  negative for cough or wheezing Urologic: negative for hematuria Abdominal: negative for nausea, vomiting, diarrhea, bright red blood per rectum, melena, or hematemesis Neurologic: negative for visual changes, syncope, or dizziness All other systems reviewed and are otherwise negative except as noted above.  Physical Exam: Blood pressure 96/47, pulse 76, temperature 97.9 F (36.6 C), temperature source Oral, resp. rate 20, SpO2 100.00%.    General: Well developed, well nourished, in no acute distress. Head: Normocephalic, atraumatic, sclera non-icteric, no xanthomas, nares are without discharge.  Neck: Negative for carotid bruits. JVD not elevated. Lungs: Clear bilaterally to auscultation without wheezes, rales, or rhonchi. Breathing is unlabored. Heart: RRR, III/VI systolic ejection murmur at apex radiating to L axilla, clear S1 S2. No murmurs, rubs, or gallops appreciated. Abdomen: Soft, non-tender, non-distended with normoactive bowel sounds. No hepatomegaly.  No rebound/guarding. No obvious abdominal masses. Msk:  Strength and tone appears normal for age. Extremities: No clubbing, cyanosis or edema.  Distal pedal pulses are 2+ and equal bilaterally. Neuro: Alert and oriented X 3. Moves all extremities spontaneously. Psych:  Responds to questions appropriately with a normal affect.  Labs: Recent Labs     10/16/12  1429  WBC  7.1  HGB  12.6  HCT  34.9*  MCV  84.9  PLT  106*    Recent Labs Lab 10/16/12 1429  NA 124*  K 4.6  CL 91*  CO2 21  BUN 38*  CREATININE 1.31*  CALCIUM 9.0  PROT 7.0  BILITOT 0.9  ALKPHOS 76  ALT 84*  AST 91*  GLUCOSE 255*   Radiology/Studies: Dg Chest Portable 1 View  10/16/2012  *RADIOLOGY REPORT*  Clinical Data: The chest pressure, palpitations, weakness  PORTABLE CHEST - 1 VIEW  Comparison: None.  Findings:  Borderline enlarged cardiac silhouette and mediastinal  contours possibly accentuated due to slightly reduced lung volumes.  The pulmonary vasculature is indistinct with cephalization of flow. Minimal bibasilar opacities favored to represent atelectasis.  No focal airspace opacity.  No definite pleural effusion or pneumothorax.  Unchanged bones.  IMPRESSION: Suspected mild pulmonary venous congestion on this low volume AP portable examination.  Further evaluation with a PA and lateral chest radiograph may be obtained as clinically indicated.   Original Report Authenticated By: Tacey Ruiz, MD     EKG 10/16/12 13:41: NSR, TWIs V3-V6, II, III, aVF, < 1 mm ST elevation I, aVL (compared to prior 2002 tracing)  ASSESSMENT AND PLAN:   67 y.o. female with no prior cardiac history, and PMHx s/f hepatitis C, DM2 with diabetic neuropathy, hypertriglyceridemia and HTN who presents to [facility] ED today with overnight chest discomfort.  1. NSTEMI 2. Type 2 DM with diabetic neuropathy 3. Hypertension 4. Hypertriglyceridemia 5. Hepatitis C 6. Thrombocytopenia 7. Labile BP 8. Murmur 9. ACEi intolerance  The  patient presents with several hours of intermittent substernal chest pressure radiating to her neck with associated shortness of breath and diaphoresis. She has several cardiac risk factors including DM2, hyperlipidemia, HTN and family history of premature CAD. EKG and troponin confirm evidence of ischemia. She has been placed on heparin gtt and IVF. She is currently pain free. Will plan to admit to telemetry and pursue cardiac catheterization tomorrow. Continue low-dose ASA, add statin. Consider fibrate for ? TG. Hold off on NTG, home ARB and BB with labile BP. Of note, BP 127/56 on 05/2012 PCP office visit. Continue to cycle cardiac biomarkers. Hold oral hypoglycemics in favor of SSI. Metformin to be held 48 hours post-cath. Monitor PLTs with heparin infusion (? Sequelae from underlying hepatitis). She is euvolemic on exam despite suspected pulmonary venous congestion on portable CXR. Continue IVF to support BP. MR murmur on exam. Question if ischemic-driven. Will evaluate further with 2D echo. Check A1C, lipid panel and TSH.    Signed, R. Hurman Horn, PA-C 10/16/2012, 4:20 PM  Patient seen and examined. I agree with the assessment and plan as detailed above. See also my additional thoughts below.   I saw and examined the patient in the emergency room. I reviewed all the information with Mr. Shaune Spittle. I determine the plan for her to be admitted and the plan for her treatment. She is stable at this time. All of the history and labs suggest that she had a non-STEMI with her worse pain during the night. She is pain-free. She is receiving heparin. We have not started IV nitroglycerin because her blood pressure has been on the low side. We will need to follow through with cardiac catheterization hopefully on April 28. If she has any return of symptoms, she will need to be cathed sooner.  Willa Rough, MD, [facility] 10/16/2012 4:33 PM

## 2012-10-16 NOTE — ED Notes (Signed)
Critical i-STAT cTnl was shown to Dr. Miller. 

## 2012-10-16 NOTE — ED Provider Notes (Signed)
History     CSN: [id number]  Arrival date & time 10/16/12  1134   First MD Initiated Contact with Patient 10/16/12 1146      Chief Complaint  Patient presents with  . Palpitations    pt states that yesterday she felt clamy and having palpitations. unable to get comfortable with sleep last night "feels like something sitting on chest"  . Blood Sugar Problem    elevated blood sugar over the past week. 250 and up    (Consider location/radiation/quality/duration/timing/severity/associated sxs/prior treatment) Patient is a 67 y.o. female presenting with chest pain. The history is provided by the patient.  Chest Pain Pain location:  Substernal area Pain quality: tightness   Pain radiates to the back: no   Pain severity:  Moderate Onset quality:  Sudden (yest while at movies, continued thru night , couldn't get comfortable.) Progression:  Waxing and waning Chronicity:  New Associated symptoms: diaphoresis and nausea   Associated symptoms: no fever and no shortness of breath   Risk factors: diabetes mellitus and hypertension     Past Medical History  Diagnosis Date  . Diabetes mellitus   . Hypertension   . Cataract     Past Surgical History  Procedure Laterality Date  . Cholecystectomy    . Eye surgery  02/2012    bilateral cataract surg    Family History  Problem Relation Age of Onset  . Anuerysm Father     History  Substance Use Topics  . Smoking status: Never Smoker   . Smokeless tobacco: Never Used  . Alcohol Use: 3.0 oz/week    6 drink(s) per week    OB History   Grav Para Term Preterm Abortions TAB SAB Ect Mult Living                  Review of Systems  Constitutional: Positive for diaphoresis. Negative for fever.  Respiratory: Negative for shortness of breath.   Cardiovascular: Positive for chest pain. Negative for leg swelling.  Gastrointestinal: Positive for nausea.  Skin: Negative.     Allergies  Lisinopril and Sulfa antibiotics  Home  Medications   Current Outpatient Rx  Name  Route  Sig  Dispense  Refill  . aspirin 81 MG tablet   Oral   Take 81 mg by mouth daily.           Marland Kitchen glimepiride (AMARYL) 4 MG tablet      TAKE TWO TABLETS BY MOUTH EVERY DAY BEFORE  BREAKFAST   60 tablet   12   . losartan (COZAAR) 100 MG tablet   Oral   Take 1 tablet (100 mg total) by mouth daily.   30 tablet   12   . metoprolol (LOPRESSOR) 50 MG tablet      TAKE ONE TABLET BY MOUTH TWICE DAILY   60 tablet   12   . amLODipine (NORVASC) 10 MG tablet   Oral   Take 1 tablet (10 mg total) by mouth daily.   30 tablet   6   . EXPIRED: fluticasone (FLONASE) 50 MCG/ACT nasal spray   Nasal   Place 2 sprays into the nose daily.   16 g   2   . hydrochlorothiazide (HYDRODIURIL) 25 MG tablet      TAKE ONE TABLET BY MOUTH DAILY   90 tablet   3   . loratadine (CLARITIN) 10 MG tablet      TAKE ONE TABLET BY MOUTH DAILY  AS NEEDED ALLERGY  30 tablet   12   . metFORMIN (GLUCOPHAGE) 1000 MG tablet      TAKE ONE TABLET BY MOUTH TWICE DAILY   180 tablet   3   . zolpidem (AMBIEN) 10 MG tablet      TAKE ONE TABLET BY MOUTH AT BEDTIME AS NEEDED FOR SLEEP   30 tablet   4     BP 94/67  Pulse 80  Temp(Src) 97.4 F (36.3 C) (Oral)  Resp 20  SpO2 97%  Physical Exam  Nursing note and vitals reviewed. Constitutional: She is oriented to person, place, and time. She appears well-developed and well-nourished.  HENT:  Head: Normocephalic.  Mouth/Throat: Oropharynx is clear and moist.  Eyes: Conjunctivae are normal. Pupils are equal, round, and reactive to light.  Neck: Normal range of motion. Neck supple.  Cardiovascular: Regular rhythm and normal heart sounds.   Pulmonary/Chest: Breath sounds normal.  Abdominal: Soft. Bowel sounds are normal.  Lymphadenopathy:    She has no cervical adenopathy.  Neurological: She is alert and oriented to person, place, and time.  Skin: Skin is warm and dry.    ED Course  Procedures  (including critical care time)  Labs Reviewed - No data to display No results found.   1. Angina pectoris, nocturnal       MDM  Sent for cardiac chest pain eval., discussed with dr harding, ecg-abnl, however would not call [facility], needs hosp eval.        Linna Hoff, MD 10/16/12 1256

## 2012-10-16 NOTE — Progress Notes (Signed)
ANTICOAGULATION CONSULT NOTE - Follow Up Consult  Pharmacy Consult for heparin Indication: NSTEMI  Labs:  Recent Labs  10/16/12 1429 10/16/12 1553 10/16/12 1721 10/16/12 1830 10/16/12 2136  HGB 12.6  --   --   --   --   HCT 34.9*  --   --   --   --   PLT 106*  --   --   --   --   APTT  --  154*  --   --   --   LABPROT  --  14.5  --   --   --   INR  --  1.15  --   --   --   HEPARINUNFRC  --   --   --   --  <0.10*  CREATININE 1.31*  --   --   --   --   TROPONINI  --   --  3.92* 4.09*  --     Assessment: 67yo female undetectable on heparin with initial dosing for NSTEMI; per MAR heparin gtt charted as "stopped" but unclear why, current RN says gtt has been running since she came on shift at 1900, so gtt may have been interrupted but unclear why or for how long.  Goal of Therapy:  Heparin level 0.3-0.7 units/ml   Plan:  Will rebolus with heparin 2000 units and increase gtt by 4 units/kg/hr to 1100 units/hr and check level in 6hr.  Vernard Gambles, PharmD, BCPS  10/16/2012,10:51 PM

## 2012-10-16 NOTE — ED Provider Notes (Signed)
History     CSN: [id number]  Arrival date & time 10/16/12  1313   First MD Initiated Contact with Patient 10/16/12 1324      Chief Complaint  Patient presents with  . Chest Pain    (Consider location/radiation/quality/duration/timing/severity/associated sxs/prior treatment) HPI  Becky Fields is a 67 y.o. female complaining of late afternonn yesterday diaphoresis, HA, palpaiataions, weakness. nasuated satting last night, substernal CP in middle of the night   Becky Fields is a 67 y.o. female c/o substernal chest pain described as pressure-like, onset in the middle of the night and it started rating radiating to the right anterior neck this a.m with nausea onset at the same time. Pain is rated at 6/10 at worst, it is now completely resolved. Pain is exacerbated by lying flat, it is not pleuritic or exertional. Pain was preceded  with diaphoresis, headache, palpitations, weakness onset yesterday while Pt was in a movie theater. Patient denies back pain, abdominal pain, emesis, fever, cough, syncope, prior similar episodes, cocaine or methamphetamine abuse, history of DVT, PE, recent travel, leg swelling or hemoptysis. Patient was given full dose aspirin at the urgent Care Center.  RF: Diabetes, Hypertension Last Stress test: ? Cardiologost: None PCP: Hensel at [facility]   Past Medical History  Diagnosis Date  . Diabetes mellitus   . Hypertension   . Cataract     Past Surgical History  Procedure Laterality Date  . Cholecystectomy    . Eye surgery  02/2012    bilateral cataract surg    Family History  Problem Relation Age of Onset  . Anuerysm Father     History  Substance Use Topics  . Smoking status: Never Smoker   . Smokeless tobacco: Never Used  . Alcohol Use: 3.0 oz/week    6 drink(s) per week    OB History   Grav Para Term Preterm Abortions TAB SAB Ect Mult Living                  Review of Systems  Constitutional: Positive for diaphoresis and fatigue.  Negative for fever.  Respiratory: Negative for shortness of breath.   Cardiovascular: Positive for chest pain and palpitations. Negative for leg swelling.  Gastrointestinal: Positive for nausea. Negative for vomiting, abdominal pain and diarrhea.  All other systems reviewed and are negative.    Allergies  Lisinopril and Sulfa antibiotics  Home Medications   Current Outpatient Rx  Name  Route  Sig  Dispense  Refill  . amLODipine (NORVASC) 10 MG tablet   Oral   Take 1 tablet (10 mg total) by mouth daily.   30 tablet   6   . aspirin 81 MG tablet   Oral   Take 81 mg by mouth daily.           Marland Kitchen EXPIRED: fluticasone (FLONASE) 50 MCG/ACT nasal spray   Nasal   Place 2 sprays into the nose daily.   16 g   2   . glimepiride (AMARYL) 4 MG tablet      TAKE TWO TABLETS BY MOUTH EVERY DAY BEFORE  BREAKFAST   60 tablet   12   . hydrochlorothiazide (HYDRODIURIL) 25 MG tablet      TAKE ONE TABLET BY MOUTH DAILY   90 tablet   3   . loratadine (CLARITIN) 10 MG tablet      TAKE ONE TABLET BY MOUTH DAILY  AS NEEDED ALLERGY   30 tablet   12   . losartan (COZAAR)  100 MG tablet   Oral   Take 1 tablet (100 mg total) by mouth daily.   30 tablet   12   . metFORMIN (GLUCOPHAGE) 1000 MG tablet      TAKE ONE TABLET BY MOUTH TWICE DAILY   180 tablet   3   . metoprolol (LOPRESSOR) 50 MG tablet      TAKE ONE TABLET BY MOUTH TWICE DAILY   60 tablet   12   . zolpidem (AMBIEN) 10 MG tablet      TAKE ONE TABLET BY MOUTH AT BEDTIME AS NEEDED FOR SLEEP   30 tablet   4     BP 96/47  Pulse 76  Temp(Src) 97.9 F (36.6 C) (Oral)  Resp 20  SpO2 100%  Physical Exam  Nursing note and vitals reviewed. Constitutional: She is oriented to person, place, and time. She appears well-developed and well-nourished. No distress.  HENT:  Head: Normocephalic.  Mouth/Throat: Oropharynx is clear and moist.  Eyes: Conjunctivae and EOM are normal. Pupils are equal, round, and reactive to  light.  Neck: No JVD present.  Cardiovascular: Normal rate, regular rhythm, normal heart sounds and intact distal pulses.   Pulmonary/Chest: Effort normal and breath sounds normal. No stridor. No respiratory distress. She has no wheezes. She has no rales. She exhibits no tenderness.  Abdominal: Soft. Bowel sounds are normal. She exhibits no distension and no mass. There is no tenderness. There is no rebound and no guarding.  Musculoskeletal: Normal range of motion. She exhibits no edema and no tenderness.  Neurological: She is alert and oriented to person, place, and time.  Skin: Skin is warm.  Psychiatric: She has a normal mood and affect.    ED Course  Procedures (including critical care time)  CRITICAL CARE Performed by: Wynetta Emery   Total critical care time: 35  Critical care time was exclusive of separately billable procedures and treating other patients.  Critical care was necessary to treat or prevent imminent or life-threatening deterioration.  Critical care was time spent personally by me on the following activities: development of treatment plan with patient and/or surrogate as well as nursing, discussions with consultants, evaluation of patient's response to treatment, examination of patient, obtaining history from patient or surrogate, ordering and performing treatments and interventions, ordering and review of laboratory studies, ordering and review of radiographic studies, pulse oximetry and re-evaluation of patient's condition.  Medications  aspirin chewable tablet 324 mg (0 mg Oral Hold 10/16/12 1347)  LORazepam (ATIVAN) injection 0.5 mg (not administered)  heparin bolus via infusion 4,000 Units (4,000 Units Intravenous Bolus from Bag 10/16/12 1526)    Followed by  heparin ADULT infusion 100 units/mL (25000 units/250 mL) (850 Units/hr Intravenous New Bag/Given 10/16/12 1519)    Labs Reviewed  CBC - Abnormal; Notable for the following:    HCT 34.9 (*)    MCHC  36.1 (*)    Platelets 106 (*)    All other components within normal limits  COMPREHENSIVE METABOLIC PANEL - Abnormal; Notable for the following:    Sodium 124 (*)    Chloride 91 (*)    Glucose, Bld 255 (*)    BUN 38 (*)    Creatinine, Ser 1.31 (*)    Albumin 3.0 (*)    AST 91 (*)    ALT 84 (*)    GFR calc non Af Amer 41 (*)    GFR calc Af Amer 48 (*)    All other components within normal limits  URINALYSIS, ROUTINE W REFLEX MICROSCOPIC - Abnormal; Notable for the following:    Color, Urine AMBER (*)    Bilirubin Urine SMALL (*)    Ketones, ur 15 (*)    Leukocytes, UA SMALL (*)    All other components within normal limits  URINE MICROSCOPIC-ADD ON - Abnormal; Notable for the following:    Squamous Epithelial / LPF FEW (*)    Bacteria, UA FEW (*)    Casts HYALINE CASTS (*)    Crystals CA OXALATE CRYSTALS (*)    All other components within normal limits  POCT I-STAT TROPONIN I - Abnormal; Notable for the following:    Troponin i, poc 3.74 (*)    All other components within normal limits  URINE CULTURE  PROTIME-INR  APTT   Dg Chest Portable 1 View  10/16/2012  *RADIOLOGY REPORT*  Clinical Data: The chest pressure, palpitations, weakness  PORTABLE CHEST - 1 VIEW  Comparison: None.  Findings:  Borderline enlarged cardiac silhouette and mediastinal contours possibly accentuated due to slightly reduced lung volumes.  The pulmonary vasculature is indistinct with cephalization of flow. Minimal bibasilar opacities favored to represent atelectasis.  No focal airspace opacity.  No definite pleural effusion or pneumothorax.  Unchanged bones.  IMPRESSION: Suspected mild pulmonary venous congestion on this low volume AP portable examination.  Further evaluation with a PA and lateral chest radiograph may be obtained as clinically indicated.   Original Report Authenticated By: Tacey Ruiz, MD      Date: 10/16/2012  Rate: 77  Rhythm: normal sinus rhythm  QRS Axis: normal  Intervals: normal   ST/T Wave abnormalities: nonspecific T wave changes  Conduction Disutrbances:none  Narrative Interpretation:   Old EKG Reviewed: changes noted T-wave inversions in the inferior and lateral leads change from prior EKG of November 2002  3:01 PM patient's troponin is elevated at 3.47 I have informed the patient that she is having a heart attack and we will begin her medication and the blood. She denies history of GI bleed or stroke. She is asked for anxiety medication, her blood pressure is systolic in the upper 90s I will give her half milligram of Ativan  1. NSTEMI (non-ST elevated myocardial infarction)   2. Hyponatremia       MDM   Becky Fields is a 67 y.o. female with resolved chest pain, history of present illness concerning for ACS. EKG shows new T-wave inversions in inferior and lateral leads, however last EKG was over a decade ago.  Troponin is elevated at 3.74 heparin per pharmacy consult ordered and expedited. Blood work also shows creatinine of 1.31 with a moderate hyponatremia of 124. Mild elevation in liver function tests as well.  Patient has a soft pressure, and hyponatremia of 124, discussed this with attending Dr. Hyacinth Meeker who advised fluid bolus of 1 L.   Pt will be admitted to cardiologist Dr. Myrtis Ser, consulted by attending Dr. Hyacinth Meeker.    Filed Vitals:   10/16/12 1333  BP: 96/47  Pulse: 76  Temp: 97.9 F (36.6 C)  TempSrc: Oral  Resp: 20  SpO2: 100%           Wynetta Emery, PA-C 10/16/12 1547

## 2012-10-16 NOTE — ED Notes (Signed)
Patient placed on Cardiac Monitor P82, and O2 by [place] @ 2L

## 2012-10-16 NOTE — Progress Notes (Signed)
ANTICOAGULATION CONSULT NOTE - Initial Consult  Pharmacy Consult for Heparin Indication: chest pain/ACS  Allergies  Allergen Reactions  . Lisinopril     REACTION: causes cough  . Sulfa Antibiotics     Told as a child allergic    Patient Measurements:   Height ~ 62 inches Weight ~ 82 kg IBW = 50 kg Heparin Dosing Weight: 68 kg  Vital Signs: Temp: 97.9 F (36.6 C) (04/27 1333) Temp src: Oral (04/27 1333) BP: 96/47 mmHg (04/27 1333) Pulse Rate: 76 (04/27 1333)  Labs:  Recent Labs  10/16/12 1429  HGB 12.6  HCT 34.9*  PLT PENDING    POC Troponin = 3.74  The CrCl is unknown because both a height and weight (above a minimum accepted value) are required for this calculation.   Medical History: Past Medical History  Diagnosis Date  . Diabetes mellitus   . Hypertension   . Cataract     PTA Medications:  ASA, Amaryl, HCTZ, Claritin, Losartan, Metformin, Metoprolol, Ambien  Assessment: 66 yo F presents to urgent care with c/o CP overnight.  Found to have EKG changes at urgent care and sent to [facility] ED.  CP is intermittent and is associated with nausea and diaphoresis.  To start heparin for [place]/ACS.  First POC Troponin elevated.  A baseline PT, PTT is not available at this time but patient denies anticoagulants/antiplatelets, other than ASA, PTA.  Goal of Therapy:  Heparin level 0.3-0.7 units/ml Monitor platelets by anticoagulation protocol: Yes   Plan:  Stat PT, PTT. Begin heparin 4000 unit IV bolus x1. Heparin infusion at 850 units/hr. Heparin level 6 hours after infusion started. Heparin level and CBC daily while on heparin.  [employer], Pharm.D., BCPS Clinical Pharmacist Pager [phone] 10/16/2012 3:07 PM

## 2012-10-17 ENCOUNTER — Encounter (HOSPITAL_COMMUNITY)
Admission: EM | Disposition: A | Discharge status: Home / Self Care | Payer: Self-pay | Source: Home / Self Care | Attending: Cardiology

## 2012-10-17 DIAGNOSIS — I214 Non-ST elevation (NSTEMI) myocardial infarction: Secondary | ICD-10-CM | POA: Diagnosis not present

## 2012-10-17 DIAGNOSIS — I2510 Atherosclerotic heart disease of native coronary artery without angina pectoris: Secondary | ICD-10-CM | POA: Diagnosis not present

## 2012-10-17 HISTORY — PX: LEFT HEART CATHETERIZATION WITH CORONARY ANGIOGRAM: SHX5451

## 2012-10-17 LAB — URINE CULTURE

## 2012-10-17 LAB — GLUCOSE, CAPILLARY
Glucose-Capillary: 211 mg/dL — ABNORMAL HIGH (ref 70–99)
Glucose-Capillary: 145 mg/dL — ABNORMAL HIGH (ref 70–99)

## 2012-10-17 LAB — CBC
Hemoglobin: 12.5 g/dL (ref 12.0–15.0)
Platelets: 83 10*3/uL — ABNORMAL LOW (ref 150–400)
RBC: 4.06 MIL/uL (ref 3.87–5.11)
WBC: 5.3 10*3/uL (ref 4.0–10.5)

## 2012-10-17 LAB — LIPID PANEL
HDL: 26 mg/dL — ABNORMAL LOW (ref >39)
LDL Cholesterol: 53 mg/dL (ref 0–99)
Triglycerides: 306 mg/dL — ABNORMAL HIGH (ref <150)
VLDL: 61 mg/dL — ABNORMAL HIGH (ref 0–40)

## 2012-10-17 LAB — HEMOGLOBIN A1C
Hgb A1c MFr Bld: 7.7 % — ABNORMAL HIGH (ref <5.7)
Mean Plasma Glucose: 174 mg/dL — ABNORMAL HIGH (ref <117)

## 2012-10-17 LAB — TSH: TSH: 1.23 u[IU]/mL (ref 0.350–4.500)

## 2012-10-17 LAB — BASIC METABOLIC PANEL
CO2: 20 mEq/L (ref 19–32)
Calcium: 8.5 mg/dL (ref 8.4–10.5)
Creatinine, Ser: 1.02 mg/dL (ref 0.50–1.10)
GFR calc non Af Amer: 56 mL/min — ABNORMAL LOW (ref >90)
Glucose, Bld: 209 mg/dL — ABNORMAL HIGH (ref 70–99)
Sodium: 128 mEq/L — ABNORMAL LOW (ref 135–145)

## 2012-10-17 LAB — HEPARIN LEVEL (UNFRACTIONATED)
Heparin Unfractionated: <0.1 IU/mL — ABNORMAL LOW (ref 0.30–0.70)
Heparin Unfractionated: 0.15 IU/mL — ABNORMAL LOW (ref 0.30–0.70)

## 2012-10-17 LAB — TROPONIN I: Troponin I: 2.64 ng/mL (ref <0.30)

## 2012-10-17 SURGERY — LEFT HEART CATHETERIZATION WITH CORONARY ANGIOGRAM
Anesthesia: LOCAL

## 2012-10-17 MED ORDER — SODIUM CHLORIDE 0.9 % IV SOLN: INTRAVENOUS | Status: DC

## 2012-10-17 MED ORDER — HEPARIN BOLUS VIA INFUSION
2000.0000 [IU] | Freq: Once | INTRAVENOUS | Status: AC
  Administered 2012-10-17: 2000 [IU] via INTRAVENOUS
  Filled 2012-10-17: qty 2000

## 2012-10-17 MED ORDER — LIDOCAINE HCL (PF) 1 % IJ SOLN
INTRAMUSCULAR | Status: AC
  Filled 2012-10-17: qty 30

## 2012-10-17 MED ORDER — VERAPAMIL HCL 2.5 MG/ML IV SOLN
INTRAVENOUS | Status: AC
  Filled 2012-10-17: qty 2

## 2012-10-17 MED ORDER — HEPARIN (PORCINE) IN NACL 2-0.9 UNIT/ML-% IJ SOLN
INTRAMUSCULAR | Status: AC
  Filled 2012-10-17: qty 1000

## 2012-10-17 MED ORDER — HEPARIN SODIUM (PORCINE) 1000 UNIT/ML IJ SOLN
INTRAMUSCULAR | Status: AC
  Filled 2012-10-17: qty 1

## 2012-10-17 MED ORDER — MIDAZOLAM HCL 2 MG/2ML IJ SOLN
INTRAMUSCULAR | Status: AC
  Filled 2012-10-17: qty 2

## 2012-10-17 MED ORDER — FENTANYL CITRATE 0.05 MG/ML IJ SOLN
INTRAMUSCULAR | Status: AC
  Filled 2012-10-17: qty 2

## 2012-10-17 MED ORDER — SODIUM CHLORIDE 0.9 % IV SOLN: INTRAVENOUS | Status: AC

## 2012-10-17 NOTE — CV Procedure (Signed)
   Cardiac Catheterization Procedure Note  Name: Becky Fields MRN: [id number] DOB: [birth year]  Procedure: Left Heart Cath, Selective Coronary Angiography, LV angiography  Indication: NSTEMI   Procedural Details: The right wrist was prepped, draped, and anesthetized with 1% lidocaine. Using the modified Seldinger technique, a 5 French sheath was introduced into the right radial artery. 3 mg of verapamil was administered through the sheath, weight-based unfractionated heparin was administered intravenously. Standard Judkins catheters were used for selective coronary angiography and left ventriculography. Catheter exchanges were performed over an exchange length guidewire. There were no immediate procedural complications. A TR band was used for radial hemostasis at the completion of the procedure.  The patient was transferred to the post catheterization recovery area for further monitoring.  Procedural Findings: Hemodynamics: AO 80/44 LV 120/20  Coronary angiography: Coronary dominance: right  Left mainstem: The left main is very short. There is no significant obstructive disease the  Left anterior descending (LAD): The LAD is large in caliber. The vessel reaches the left ventricular apex. There are diffuse luminal irregularities without high-grade stenosis. The diagonal branches are patent.  Left circumflex (LCx): The left circumflex is patent throughout. It is a dominant vessel with 3 obtuse marginal branches in the left PDA branch. The origin of the first obtuse marginal has 40-50% stenosis.  Right coronary artery (RCA): The RCA is of medium caliber. It is a nondominant vessel. There is a 40-50% proximal stenosis.  Left ventriculography: Left ventricular systolic function is normal, LVEF is estimated at 55-65%, there is no significant mitral regurgitation   Final Conclusions:   1. No significant obstructive CAD 2. 4+ mitral regurgitation 3. 40 mm transaortic valve gradient, suspect LV  outflow rather than valvular aortic stenosis 4. Mild segmental contraction abnormality with periapical hypokinesis but hyperdynamic basilar segments, LVEF estimated at 65%  Recommendations: Check 2-D echocardiogram to evaluate mitral regurgitation and LV outflow tract gradient. Consider Takotsubo's cardiomyopathy as possible etiology to her non-ST elevation infarction.  Tonny Bollman 10/17/2012, 5:31 PM

## 2012-10-17 NOTE — Interval H&P Note (Signed)
History and Physical Interval Note:  10/17/2012 4:42 PM  Becky Fields  has presented today for surgery, with the diagnosis of cp  The various methods of treatment have been discussed with the patient and family. After consideration of risks, benefits and other options for treatment, the patient has consented to  Procedure(s): LEFT HEART CATHETERIZATION WITH CORONARY ANGIOGRAM (N/A) as a surgical intervention .  The patient's history has been reviewed, patient examined, no change in status, stable for surgery.  I have reviewed the patient's chart and labs.  Questions were answered to the patient's satisfaction.     Tonny Bollman

## 2012-10-17 NOTE — Progress Notes (Signed)
ANTICOAGULATION CONSULT NOTE - Follow Up Consult  Pharmacy Consult for heparin Indication: NSTEMI  Labs:  Recent Labs  10/16/12 1429 10/16/12 1553 10/16/12 1721 10/16/12 1830 10/16/12 2136 10/17/12 0005 10/17/12 0520  HGB 12.6  --   --   --   --   --  12.5  HCT 34.9*  --   --   --   --   --  34.5*  PLT 106*  --   --   --   --   --  PENDING  APTT  --  154*  --   --   --   --   --   LABPROT  --  14.5  --   --   --   --   --   INR  --  1.15  --   --   --   --   --   HEPARINUNFRC  --   --   --   --  <0.10*  --  <0.10*  CREATININE 1.31*  --   --   --   --   --   --   TROPONINI  --   --  3.92* 4.09*  --  2.64*  --     Assessment: 67yo female remains undetectable on heparin despite bolus and rate increase; per RN IV line was unconnected for unknown amount of time; scheduled for cath today at 1500.  Goal of Therapy:  Heparin level 0.3-0.7 units/ml   Plan:  Will rebolus with heparin 2000 units and increase gtt by 4 units/kg/hr to 1400 units/hr and check level in 6hr.  Vernard Gambles, PharmD, BCPS  10/17/2012,6:19 AM

## 2012-10-17 NOTE — Care Management Note (Unsigned)
    Page 1 of 1   10/17/2012     10:30:35 AM   CARE MANAGEMENT NOTE 10/17/2012  Patient:  Becky Fields,Becky Fields   Account Number:  [id number]  Date Initiated:  10/17/2012  Documentation initiated by:  GRAVES-BIGELOW,Antania Hoefling  Subjective/Objective Assessment:   Pt admitted with NStemi. Plan for cath today.     Action/Plan:   CM will continue ot monitor for any disposition needs.   Anticipated DC Date:  10/19/2012   Anticipated DC Plan:  HOME/SELF CARE      DC Planning Services  CM consult      Choice offered to / List presented to:             Status of service:  In process, will continue to follow Medicare Important Message given?   (If response is "NO", the following Medicare IM given date fields will be blank) Date Medicare IM given:   Date Additional Medicare IM given:    Discharge Disposition:    Per UR Regulation:  Reviewed for med. necessity/level of care/duration of stay  If discussed at Long Length of Stay Meetings, dates discussed:    Comments:

## 2012-10-17 NOTE — Progress Notes (Signed)
ANTICOAGULATION CONSULT NOTE - Follow Up Consult  Pharmacy Consult for Heparin Indication: chest pain/ACS  Allergies  Allergen Reactions  . Lisinopril     REACTION: causes cough  . Sulfa Antibiotics     Told as a child allergic    Patient Measurements: Height: 5\' 4"  (162.6 cm) Weight: 182 lb 15.7 oz (83 kg) IBW/kg (Calculated) : 54.7 Heparin Dosing Weight: 68kg  Vital Signs: Temp: 98.3 F (36.8 C) (04/28 1224) Temp src: Oral (04/28 1224) BP: 102/67 mmHg (04/28 1224) Pulse Rate: 106 (04/28 1224)  Labs:  Recent Labs  10/16/12 1429 10/16/12 1553 10/16/12 1721 10/16/12 1830 10/16/12 2136 10/17/12 0005 10/17/12 0520 10/17/12 1239  HGB 12.6  --   --   --   --   --  12.5  --   HCT 34.9*  --   --   --   --   --  34.5*  --   PLT 106*  --   --   --   --   --  83*  --   APTT  --  154*  --   --   --   --   --   --   LABPROT  --  14.5  --   --   --   --   --   --   INR  --  1.15  --   --   --   --   --   --   HEPARINUNFRC  --   --   --   --  <0.10*  --  <0.10* 0.15*  CREATININE 1.31*  --   --   --   --   --  1.02  --   TROPONINI  --   --  3.92* 4.09*  --  2.64*  --   --     Estimated Creatinine Clearance: 56.5 ml/min (by C-G formula based on Cr of 1.02).   Assessment: 67yof on heparin for NSTEMI. Heparin level (0.15) is subtherapeutic but trended up. Per RN, heparin has been turned off for cath procedure - will follow-up post cath. - H/H stable, Plts low - No significant bleeding reported  Goal of Therapy:  Heparin level 0.3-0.7 units/ml Monitor platelets by anticoagulation protocol: Yes   Plan:  1. Follow-up post cath orders  Cleon Dew [phone] 10/17/2012,4:01 PM

## 2012-10-17 NOTE — Progress Notes (Signed)
UR Completed Jenel Gierke Graves-Bigelow, RN,BSN [phone]  

## 2012-10-17 NOTE — ED Provider Notes (Signed)
Medical screening examination/treatment/procedure(s) were conducted as a shared visit with non-physician practitioner(s) and myself.  I personally evaluated the patient during the encounter  Please see my separate respective documentation pertaining to this patient encounter   Vida Roller, MD 10/17/12 [phone]

## 2012-10-18 DIAGNOSIS — I059 Rheumatic mitral valve disease, unspecified: Secondary | ICD-10-CM

## 2012-10-18 DIAGNOSIS — I340 Nonrheumatic mitral (valve) insufficiency: Secondary | ICD-10-CM | POA: Diagnosis present

## 2012-10-18 DIAGNOSIS — I5181 Takotsubo syndrome: Secondary | ICD-10-CM | POA: Diagnosis present

## 2012-10-18 DIAGNOSIS — I214 Non-ST elevation (NSTEMI) myocardial infarction: Secondary | ICD-10-CM | POA: Diagnosis not present

## 2012-10-18 LAB — CBC
MCHC: 35.2 g/dL (ref 30.0–36.0)
Platelets: 102 10*3/uL — ABNORMAL LOW (ref 150–400)
RDW: 11.9 % (ref 11.5–15.5)
WBC: 4.4 10*3/uL (ref 4.0–10.5)

## 2012-10-18 LAB — BASIC METABOLIC PANEL
Chloride: 104 mEq/L (ref 96–112)
Creatinine, Ser: 0.71 mg/dL (ref 0.50–1.10)
GFR calc Af Amer: >90 mL/min (ref >90)
GFR calc non Af Amer: 88 mL/min — ABNORMAL LOW (ref >90)
Potassium: 4.1 mEq/L (ref 3.5–5.1)

## 2012-10-18 LAB — GLUCOSE, CAPILLARY
Glucose-Capillary: 233 mg/dL — ABNORMAL HIGH (ref 70–99)
Glucose-Capillary: 142 mg/dL — ABNORMAL HIGH (ref 70–99)
Glucose-Capillary: 164 mg/dL — ABNORMAL HIGH (ref 70–99)

## 2012-10-18 MED ORDER — METOPROLOL TARTRATE 25 MG PO TABS
25.0000 mg | ORAL_TABLET | Freq: Two times a day (BID) | ORAL | Status: DC
  Administered 2012-10-18 – 2012-10-19 (×2): 25 mg via ORAL
  Filled 2012-10-18 (×3): qty 1

## 2012-10-18 NOTE — Progress Notes (Signed)
Patient ID: Becky Fields, female   DOB: April 09, 1946, 67 y.o.   MRN: [id number]   SUBJECTIVE:  The patient feels well today. The catheterization data is reviewed. I have now also been able to see the echo that she had as the day went on today. All of the findings fit with the diagnosis of Tokatsubo. The base of her LV is vigorous. There is hypokinesis of the apex. She has significant systolic anterior motion of the mitral valve. This leads to a left ventricular outflow tract gradient. It also leads to mitral regurgitation that is significant. I believe that these findings are related to the hyperdynamic motion at the base.   Filed Vitals:   10/17/12 1625 10/17/12 2000 10/18/12 0500 10/18/12 1433  BP:  115/69 110/70 142/67  Pulse: 103 102 86 93  Temp:  98.2 F (36.8 C) 98.5 F (36.9 C) 97.8 F (36.6 C)  TempSrc:    Oral  Resp:  18 18 14   Height:      Weight:   178 lb 5.6 oz (80.9 kg)   SpO2:  95% 92% 97%    Intake/Output Summary (Last 24 hours) at 10/18/12 1609 Last data filed at 10/18/12 1500  Gross per 24 hour  Intake    720 ml  Output      1 ml  Net    719 ml    LABS: Basic Metabolic Panel:  Recent Labs  78/29/56 0520 10/18/12 0459  NA 128* 135  K 3.9 4.1  CL 97 104  CO2 20 23  GLUCOSE 209* 154*  BUN 37* 22  CREATININE 1.02 0.71  CALCIUM 8.5 8.6   Liver Function Tests:  Recent Labs  10/16/12 1429  AST 91*  ALT 84*  ALKPHOS 76  BILITOT 0.9  PROT 7.0  ALBUMIN 3.0*   No results found for this basename: LIPASE, AMYLASE,  in the last 72 hours CBC:  Recent Labs  10/17/12 0520 10/18/12 0459  WBC 5.3 4.4  HGB 12.5 11.8*  HCT 34.5* 33.5*  MCV 85.0 86.3  PLT 83* 102*   Cardiac Enzymes:  Recent Labs  10/16/12 1721 10/16/12 1830 10/17/12 0005  TROPONINI 3.92* 4.09* 2.64*   BNP: No components found with this basename: POCBNP,  D-Dimer: No results found for this basename: DDIMER,  in the last 72 hours Hemoglobin A1C:  Recent Labs  10/16/12 1830    HGBA1C 7.7*   Fasting Lipid Panel:  Recent Labs  10/17/12 0520  CHOL 140  HDL 26*  LDLCALC 53  TRIG 213*  CHOLHDL 5.4   Thyroid Function Tests:  Recent Labs  10/16/12 1830  TSH 1.230    RADIOLOGY: Dg Chest Portable 1 View  10/16/2012  *RADIOLOGY REPORT*  Clinical Data: The chest pressure, palpitations, weakness  PORTABLE CHEST - 1 VIEW  Comparison: None.  Findings:  Borderline enlarged cardiac silhouette and mediastinal contours possibly accentuated due to slightly reduced lung volumes.  The pulmonary vasculature is indistinct with cephalization of flow. Minimal bibasilar opacities favored to represent atelectasis.  No focal airspace opacity.  No definite pleural effusion or pneumothorax.  Unchanged bones.  IMPRESSION: Suspected mild pulmonary venous congestion on this low volume AP portable examination.  Further evaluation with a PA and lateral chest radiograph may be obtained as clinically indicated.   Original Report Authenticated By: Tacey Ruiz, MD     PHYSICAL EXAM  The patient is stable. There is no jugular venous distention. Lungs are clear. Respiratory effort was nonlabored. Cardiac exam revealed  an S1 and S2. I could not hear a significant murmur. The abdomen was soft. There was no peripheral edema.  TELEMETRY: Telemetry revealed sinus rhythm with sinus tachycardia.   ASSESSMENT AND PLAN:    DM (diabetes mellitus), type 2,       This is being treated.      Mitral regurgitation     Patient had mitral regurgitation in the cath lab. This is related to the systolic anterior motion of the mitral valve that she has because of the hyperdynamic function at the base of her heart. I believe that this will stabilize as soon as she improves from her Takosubo event. Beta-blockade will be increased.    Non-STEMI (non-ST elevated myocardial infarction)     Her enzyme abnormalities are related to the Takotsubo event. We know that her coronaries are normal by cath. No further  workup.    Takotsubo syndrome    The patient still has sinus tachycardia. She has mitral regurgitation and LV outflow tract gradient related to the event. Beta-blockade needs to be increased and she needs to be watched carefully in the hospital until she begins to completely stabilize.  Willa Rough 10/18/2012 4:09 PM

## 2012-10-19 DIAGNOSIS — I5181 Takotsubo syndrome: Secondary | ICD-10-CM | POA: Diagnosis not present

## 2012-10-19 DIAGNOSIS — I214 Non-ST elevation (NSTEMI) myocardial infarction: Secondary | ICD-10-CM | POA: Diagnosis not present

## 2012-10-19 LAB — CBC
MCH: 30.2 pg (ref 26.0–34.0)
Platelets: 145 10*3/uL — ABNORMAL LOW (ref 150–400)
RBC: 4.37 MIL/uL (ref 3.87–5.11)

## 2012-10-19 LAB — GLUCOSE, CAPILLARY: Glucose-Capillary: 149 mg/dL — ABNORMAL HIGH (ref 70–99)

## 2012-10-19 LAB — BASIC METABOLIC PANEL
CO2: 23 mEq/L (ref 19–32)
Calcium: 9.5 mg/dL (ref 8.4–10.5)
GFR calc non Af Amer: 87 mL/min — ABNORMAL LOW (ref >90)
Potassium: 4.4 mEq/L (ref 3.5–5.1)
Sodium: 133 mEq/L — ABNORMAL LOW (ref 135–145)

## 2012-10-19 MED ORDER — NITROGLYCERIN 0.4 MG SL SUBL: 0.4000 mg | SUBLINGUAL_TABLET | SUBLINGUAL | Status: DC | PRN

## 2012-10-19 MED ORDER — LOSARTAN POTASSIUM 50 MG PO TABS: 50.0000 mg | ORAL_TABLET | Freq: Every day | ORAL | Status: DC

## 2012-10-19 MED ORDER — ATORVASTATIN CALCIUM 40 MG PO TABS: 40.0000 mg | ORAL_TABLET | Freq: Every day | ORAL | Status: DC

## 2012-10-19 MED ORDER — LOSARTAN POTASSIUM 50 MG PO TABS
50.0000 mg | ORAL_TABLET | Freq: Every day | ORAL | Status: DC
  Administered 2012-10-19: 50 mg via ORAL
  Filled 2012-10-19: qty 1

## 2012-10-19 NOTE — Progress Notes (Addendum)
Patient ID: Becky Fields, female   DOB: 10-23-1945, 67 y.o.   MRN: [id number]   SUBJECTIVE:  The patient is feeling much better. I started a beta blocker yesterday and her heart rate is under better control. She is recovering from her Takotsubo event.     Filed Vitals:   10/18/12 1433 10/18/12 1721 10/18/12 2121 10/19/12 0500  BP: 142/67  112/61 125/81  Pulse: 93 101 86 80  Temp: 97.8 F (36.6 C)  98.5 F (36.9 C) 97.5 F (36.4 C)  TempSrc: Oral  Oral   Resp: 14   16  Height:      Weight:      SpO2: 97%  96% 98%    Intake/Output Summary (Last 24 hours) at 10/19/12 0824 Last data filed at 10/18/12 1700  Gross per 24 hour  Intake   1080 ml  Output      2 ml  Net   1078 ml    LABS: Basic Metabolic Panel:  Recent Labs  40/98/11 0459 10/19/12 0515  NA 135 133*  K 4.1 4.4  CL 104 99  CO2 23 23  GLUCOSE 154* 171*  BUN 22 18  CREATININE 0.71 0.74  CALCIUM 8.6 9.5   Liver Function Tests:  Recent Labs  10/16/12 1429  AST 91*  ALT 84*  ALKPHOS 76  BILITOT 0.9  PROT 7.0  ALBUMIN 3.0*   No results found for this basename: LIPASE, AMYLASE,  in the last 72 hours CBC:  Recent Labs  10/18/12 0459 10/19/12 0515  WBC 4.4 8.4  HGB 11.8* 13.2  HCT 33.5* 38.1  MCV 86.3 87.2  PLT 102* 145*   Cardiac Enzymes:  Recent Labs  10/16/12 1721 10/16/12 1830 10/17/12 0005  TROPONINI 3.92* 4.09* 2.64*   BNP: No components found with this basename: POCBNP,  D-Dimer: No results found for this basename: DDIMER,  in the last 72 hours Hemoglobin A1C:  Recent Labs  10/16/12 1830  HGBA1C 7.7*   Fasting Lipid Panel:  Recent Labs  10/17/12 0520  CHOL 140  HDL 26*  LDLCALC 53  TRIG 914*  CHOLHDL 5.4   Thyroid Function Tests:  Recent Labs  10/16/12 1830  TSH 1.230    RADIOLOGY: Dg Chest Portable 1 View  10/16/2012  *RADIOLOGY REPORT*  Clinical Data: The chest pressure, palpitations, weakness  PORTABLE CHEST - 1 VIEW  Comparison: None.  Findings:   Borderline enlarged cardiac silhouette and mediastinal contours possibly accentuated due to slightly reduced lung volumes.  The pulmonary vasculature is indistinct with cephalization of flow. Minimal bibasilar opacities favored to represent atelectasis.  No focal airspace opacity.  No definite pleural effusion or pneumothorax.  Unchanged bones.  IMPRESSION: Suspected mild pulmonary venous congestion on this low volume AP portable examination.  Further evaluation with a PA and lateral chest radiograph may be obtained as clinically indicated.   Original Report Authenticated By: Tacey Ruiz, MD     PHYSICAL EXAM   Patient is oriented to person time and place. Affect is normal. Lungs are clear. Respiratory effort is not labored. Cardiac exam her vitals S1 and S2. I do not hear her murmurs. Abdomen is soft. There is no peripheral edema.   TELEMETRY: Have reviewed telemetry today October 19, 2012. There is normal sinus rhythm. The rate is under better control now.   ASSESSMENT AND PLAN:    DM (diabetes mellitus), type 2, uncontrolled   HYPERTRIGLYCERIDEMIA   HYPERTENSION, BENIGN ESSENTIAL    Mitral regurgitation  I am hopeful that her mitral regurgitation will go away completely as she recovers from her event.    Non-STEMI (non-ST elevated myocardial infarction)      This was all related to Takotsubo.    Takotsubo syndrome     Because she still has an apical wall motion abnormality I will now add a small dose of an ARB.  She had been on losartan before admission. I will restart her and 50 mg daily. I had just started her beta blocker yesterday. I chose metoprolol specifically because I was more interested in rate control and the overall beta blocker effect. She is stable. She can be discharged home. She needs to be seen back in the office within 2 weeks. 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 She's not to do any heavy lifting.   Willa Rough 10/19/2012  8:24 AM

## 2012-10-19 NOTE — Discharge Summary (Signed)
CARDIOLOGY DISCHARGE SUMMARY   Patient ID: Becky Fields MRN: [id number] DOB/AGE: 66-Feb-1947 66 y.o.  Admit date: 10/16/2012 Discharge date: 10/19/2012  Primary Discharge Diagnosis:    Non-STEMI (non-ST elevated myocardial infarction) secondary to Takotsubo syndrome  Secondary Discharge Diagnosis:    DM (diabetes mellitus), type 2, uncontrolled   HYPERTRIGLYCERIDEMIA   HYPERTENSION, BENIGN ESSENTIAL   Mitral regurgitation  Procedures: Left Heart Cath, Selective Coronary Angiography, LV angiography, 2-D echocardiogram  Hospital Course: Becky Fields is a 68 y.o. female with no history of CAD. She had prolonged chest pain and came to the hospital. Her initial troponin was elevated. Her pain improved with medical therapy she was started on heparin but her blood pressure was too low for IV nitroglycerin. She was started on aspirin. She was admitted for further evaluation and treatment.  Her cardiac enzymes remained negative, and she ruled in for a non-STEMI. She was taken to the cardiac catheterization lab on 10/17/2012. Her cardiac catheterization was consistent with the Takotsubo cardiomyopathy. An echocardiogram was ordered. The echocardiogram was also consistent with the Takotsubo cardiomyopathy. Medical therapy was recommended.  Because of her MI, she had her beta blocker and ARB dose is adjusted. She was also started on a statin. Her blood sugars were managed with a combination of home medications and sliding scale insulin. Hemoglobin A1c was checked and was 7.7. She is to followup with primary care for this. Her labs were reviewed and showed some hyponatremia but this improved during her hospital stay. Additionally, she had some mild renal insufficiency with a BUN of 38 and creatinine 1.31 on admission. Her HCTZ was discontinued. Her renal function and hyponatremia improved by discharge. She had been on aspirin 81 mg prior to admission and this was continued.  Labs:   Lab Results    Component Value Date   WBC 8.4 10/19/2012   HGB 13.2 10/19/2012   HCT 38.1 10/19/2012   MCV 87.2 10/19/2012   PLT 145* 10/19/2012    Recent Labs Lab 10/16/12 1429  10/19/12 0515  NA 124*  < > 133*  K 4.6  < > 4.4  CL 91*  < > 99  CO2 21  < > 23  BUN 38*  < > 18  CREATININE 1.31*  < > 0.74  CALCIUM 9.0  < > 9.5  PROT 7.0  --   --   BILITOT 0.9  --   --   ALKPHOS 76  --   --   ALT 84*  --   --   AST 91*  --   --   GLUCOSE 255*  < > 171*  < > = values in this interval not displayed.  Recent Labs  10/16/12 1721 10/16/12 1830 10/17/12 0005  TROPONINI 3.92* 4.09* 2.64*   Lipid Panel     Component Value Date/Time   CHOL 140 10/17/2012 0520   TRIG 306* 10/17/2012 0520   HDL 26* 10/17/2012 0520   CHOLHDL 5.4 10/17/2012 0520   VLDL 61* 10/17/2012 0520   LDLCALC 53 10/17/2012 0520    Recent Labs  10/16/12 1553  INR 1.15   Lab Results  Component Value Date   HGBA1C 7.7* 10/16/2012    Radiology: Dg Chest Portable 1 View 10/16/2012  *RADIOLOGY REPORT*  Clinical Data: The chest pressure, palpitations, weakness  PORTABLE CHEST - 1 VIEW  Comparison: None.  Findings:  Borderline enlarged cardiac silhouette and mediastinal contours possibly accentuated due to slightly reduced lung volumes.  The pulmonary vasculature is indistinct with  cephalization of flow. Minimal bibasilar opacities favored to represent atelectasis.  No focal airspace opacity.  No definite pleural effusion or pneumothorax.  Unchanged bones.  IMPRESSION: Suspected mild pulmonary venous congestion on this low volume AP portable examination.  Further evaluation with a PA and lateral chest radiograph may be obtained as clinically indicated.   Original Report Authenticated By: Tacey Ruiz, MD     Cardiac Cath: 10/17/2012 Left mainstem: The left main is very short. There is no significant obstructive disease the  Left anterior descending (LAD): The LAD is large in caliber. The vessel reaches the left ventricular apex.  There are diffuse luminal irregularities without high-grade stenosis. The diagonal branches are patent.  Left circumflex (LCx): The left circumflex is patent throughout. It is a dominant vessel with 3 obtuse marginal branches in the left PDA branch. The origin of the first obtuse marginal has 40-50% stenosis.  Right coronary artery (RCA): The RCA is of medium caliber. It is a nondominant vessel. There is a 40-50% proximal stenosis.  Left ventriculography: Left ventricular systolic function is normal, LVEF is estimated at 55-65%, there is no significant mitral regurgitation  Final Conclusions:  1. No significant obstructive CAD  2. 4+ mitral regurgitation  3. 40 mm transaortic valve gradient, suspect LV outflow rather than valvular aortic stenosis  4. Mild segmental contraction abnormality with periapical hypokinesis but hyperdynamic basilar segments, LVEF estimated at 65%  Recommendations: Check 2-D echocardiogram to evaluate mitral regurgitation and LV outflow tract gradient. Consider Takotsubo's cardiomyopathy as possible etiology to her non-ST elevation infarction.  EKG:  EKG reveals new diffuse TWIs, < 1 mm ST elevation I, aVL when compared to prior 2002 tracing.  Echo:  10/18/2012 - Left ventricle: The base is hyperdynamic. The apex is hypokinetic. Findings are consistent with Takotsubo cardiomyopathy. The cavity size was normal. Wall thickness was increased in a pattern of mild LVH. The estimated ejection fraction was 45%. Doppler parameters are consistent with high ventricular filling pressure. The outflow tract showed presence ofsubvalvar obstruction. - Mitral valve: There is SAM of the mitral valve. This creates LVOT gradient and leads to significant MR. Mildly calcified annulus. - Pulmonary arteries: PA peak pressure: 31mm Hg (S). - Impressions: I feel all of the findings are related to a Takotsubo event. Impressions: - I feel all of the findings are related to a Takotsubo  event.   FOLLOW UP PLANS AND APPOINTMENTS Allergies  Allergen Reactions  . Lisinopril     REACTION: causes cough  . Sulfa Antibiotics     Told as a child allergic     Medication List    STOP taking these medications       hydrochlorothiazide 25 MG tablet  Commonly known as:  HYDRODIURIL      TAKE these medications       aspirin 81 MG tablet  Take 81 mg by mouth daily.     atorvastatin 40 MG tablet  Commonly known as:  LIPITOR  Take 1 tablet (40 mg total) by mouth daily.     glimepiride 4 MG tablet  Commonly known as:  AMARYL  TAKE TWO TABLETS BY MOUTH EVERY DAY BEFORE  BREAKFAST     loratadine 10 MG tablet  Commonly known as:  CLARITIN  TAKE ONE TABLET BY MOUTH DAILY  AS NEEDED ALLERGY     losartan 50 MG tablet  Commonly known as:  COZAAR  Take 1 tablet (50 mg total) by mouth daily.     metFORMIN 1000 MG tablet  Commonly known as:  GLUCOPHAGE  TAKE ONE TABLET BY MOUTH TWICE DAILY     metoprolol 50 MG tablet  Commonly known as:  LOPRESSOR  TAKE ONE TABLET BY MOUTH TWICE DAILY     MILK THISTLE PO  Take 1 tablet by mouth daily.     multivitamin with minerals Tabs  Take 1 tablet by mouth daily.     nitroGLYCERIN 0.4 MG SL tablet  Commonly known as:  NITROSTAT  Place 1 tablet (0.4 mg total) under the tongue every 5 (five) minutes x 3 doses as needed for chest pain.     SYSTANE OP  Apply 1 drop to eye 2 (two) times daily as needed (dry eyes).     zolpidem 10 MG tablet  Commonly known as:  AMBIEN  TAKE ONE TABLET BY MOUTH AT BEDTIME AS NEEDED FOR SLEEP        Discharge Orders   Future Appointments Provider Department Dept Phone   11/02/2012 11:50 AM Beatrice Lecher, PA-C [facility] Main Office [place]) [phone]   Future Orders Complete By Expires     Diet - low sodium heart healthy  As directed     Diet Carb Modified  As directed     Increase activity slowly  As directed       Follow-up Information   Follow up with Tereso Newcomer, PA-C On  11/02/2012. (11:50 am)    Contact information:   [address] [place] [address] [phone]       BRING ALL MEDICATIONS WITH YOU TO FOLLOW UP APPOINTMENTS  Time spent with patient to include physician time: 39 min Signed: Theodore Demark, PA-C 10/19/2012, 12:16 PM Patient seen and examined. I agree with the assessment and plan as detailed above. See also my additional thoughts below.   See my progress note also. I decided patient could go home.  Willa Rough, MD, [facility] 10/20/2012 8:36 AM

## 2012-10-19 NOTE — Progress Notes (Signed)
CARDIAC REHAB PHASE I   PRE:  Rate/Rhythm: off monitor, regular beat    BP: sitting 124/70    SaO2:   MODE:  Ambulation: 350 ft   POST:  Rate/Rhythm:     BP: sitting 130/78     SaO2:   Notified by RN that pt was MI/Takotsubo. Will write Phase I order. Walked with pt, slightly SOB toward end of walk. Ed completed. Pt wanted to stand during ed which she seemed to tolerate well. Discussed controlling DM and triglycerides, increasing HDL, ex, and CRPII. Requests her name be sent to [place] [facility]. Will send.  [id number]   Elissa Lovett Dana CES, ACSM 10/19/2012 2:31 PM

## 2012-10-21 ENCOUNTER — Other Ambulatory Visit: Payer: Self-pay | Admitting: *Deleted

## 2012-10-25 ENCOUNTER — Other Ambulatory Visit: Payer: Self-pay | Admitting: *Deleted

## 2012-10-25 DIAGNOSIS — E1165 Type 2 diabetes mellitus with hyperglycemia: Secondary | ICD-10-CM

## 2012-10-25 MED ORDER — METOPROLOL TARTRATE 50 MG PO TABS: ORAL_TABLET | ORAL | Status: DC

## 2012-10-25 MED ORDER — GLIMEPIRIDE 4 MG PO TABS: ORAL_TABLET | ORAL | Status: DC

## 2012-10-25 MED ORDER — FLUTICASONE PROPIONATE 50 MCG/ACT NA SUSP: 2.0000 | Freq: Every day | NASAL | Status: DC

## 2012-10-25 NOTE — Telephone Encounter (Signed)
Refill requested for Fluticasone - is not listed on MAR. Please advise - will place fax in box. Thanks. Davi Rotan, Harold Hedge, RN

## 2012-10-25 NOTE — Assessment & Plan Note (Signed)
Refill meds

## 2012-10-25 NOTE — Telephone Encounter (Signed)
Called and verified that she will see me soon (5/16).

## 2012-11-01 ENCOUNTER — Encounter: Payer: Self-pay | Admitting: Cardiology

## 2012-11-02 ENCOUNTER — Encounter: Payer: Medicare Other | Admitting: Physician Assistant

## 2012-11-04 ENCOUNTER — Encounter: Payer: Self-pay | Admitting: Family Medicine

## 2012-11-04 ENCOUNTER — Ambulatory Visit (INDEPENDENT_AMBULATORY_CARE_PROVIDER_SITE_OTHER): Payer: Medicare Other | Admitting: Family Medicine

## 2012-11-04 ENCOUNTER — Telehealth: Payer: Self-pay | Admitting: Cardiology

## 2012-11-04 ENCOUNTER — Encounter: Payer: Self-pay | Admitting: Cardiology

## 2012-11-04 ENCOUNTER — Ambulatory Visit (INDEPENDENT_AMBULATORY_CARE_PROVIDER_SITE_OTHER): Payer: Medicare Other | Admitting: Cardiology

## 2012-11-04 VITALS — BP 150/72 | HR 80 | Ht 62.0 in | Wt 178.0 lb

## 2012-11-04 VITALS — BP 178/76 | HR 68 | Temp 98.2°F | Ht 62.0 in | Wt 177.4 lb

## 2012-11-04 DIAGNOSIS — I340 Nonrheumatic mitral (valve) insufficiency: Secondary | ICD-10-CM

## 2012-11-04 DIAGNOSIS — I5181 Takotsubo syndrome: Secondary | ICD-10-CM

## 2012-11-04 DIAGNOSIS — I10 Essential (primary) hypertension: Secondary | ICD-10-CM

## 2012-11-04 DIAGNOSIS — I059 Rheumatic mitral valve disease, unspecified: Secondary | ICD-10-CM | POA: Diagnosis not present

## 2012-11-04 DIAGNOSIS — E1149 Type 2 diabetes mellitus with other diabetic neurological complication: Secondary | ICD-10-CM | POA: Diagnosis not present

## 2012-11-04 DIAGNOSIS — Z860100 Personal history of colon polyps, unspecified: Secondary | ICD-10-CM

## 2012-11-04 MED ORDER — METOPROLOL TARTRATE 50 MG PO TABS: 75.0000 mg | ORAL_TABLET | Freq: Two times a day (BID) | ORAL | Status: DC

## 2012-11-04 MED ORDER — LOSARTAN POTASSIUM 100 MG PO TABS: 100.0000 mg | ORAL_TABLET | Freq: Every day | ORAL | Status: DC

## 2012-11-04 NOTE — Assessment & Plan Note (Signed)
BP up.  I suggest increase losartin to 100 mg daily. Patient seeing cards - Dr. Myrtis Ser today.  Will verify with him

## 2012-11-04 NOTE — Patient Instructions (Addendum)
**Note De-Identified Elexius Minar Obfuscation** Your physician has recommended you make the following change in your medication: increase Metoprolol to 75 mg (1 and 1/2 tablets) twice daily. Please take 100 mg (2 tablets) of Metoprolol twice daily only if your pulse rate is greater than 70.  Your physician recommends that you weigh, daily, at the same time every day, and in the same amount of clothing. Please record your daily weights on the handout provided and bring it to your next appointment. Please call office at [phone] to report blood pressures and heart rates.  Your physician recommends that you schedule a follow-up appointment in: July

## 2012-11-04 NOTE — Assessment & Plan Note (Signed)
Asymptomatic at present.

## 2012-11-04 NOTE — Assessment & Plan Note (Signed)
Clinically the patient is doing well. We will plan a followup echo in the future.

## 2012-11-04 NOTE — Progress Notes (Signed)
  Subjective:    Patient ID: Becky Fields, female    DOB: 1945/06/25, 67 y.o.   MRN: [id number]  HPI Hospitalized with broken heart syndrome likely due to chronic stress.  Feeling better since [facility].  Taking meds Exercising more and eating healthier.    Review of Systems Denies CP or SOB or leg swelling     Objective:   Physical Exam BP up and noted Cardiac RRR without m or g. Trace edema Diabetic foot exam, neuropathy and OK pulses.       Assessment & Plan:

## 2012-11-04 NOTE — Assessment & Plan Note (Signed)
The patient has mitral regurgitation that was associated with Takotsubo. I am hoping that as this improves her mitral regurgitation will go away. She still has a systolic murmur that it probably represents mitral regurgitation.

## 2012-11-04 NOTE — Telephone Encounter (Signed)
New problem   Pt has been discharged for 2wk and want to know if she has any lifting restrictions. Please call pt

## 2012-11-04 NOTE — Assessment & Plan Note (Signed)
Her blood pressure is elevated today. I agree that we should push for tighter control. Because she has a resting heart rate today of 80 I've chosen to push her beta blocker dose higher before pushing her other meds higher. She will increase her Lopressor to  75mg  twice a day. She will take this dose for one week. She'll be recording her blood pressure and her pulse at home. If her pulse remains above 70 beats per minute, she will increase the dose to 100 mg twice daily. She will also call her pulse and blood pressure in to [place].

## 2012-11-04 NOTE — Assessment & Plan Note (Signed)
Sub optimal control, but she is very focused on life style changes.

## 2012-11-04 NOTE — Progress Notes (Signed)
HPI  Patient is seen today post hospitalization. She presented with some chest tightness and shortness of breath and an abnormal troponin. Catheterization showed no significant coronary disease. However LV motion was consistent with Takotsubo. The echo was also consistent with this. The base of her LV was so vigorous that she had some outflow tract obstruction. This was also mitral regurgitation on this basis. Clinically she is doing very well. Her blood pressure is elevated today and we will adjust her meds further. She has had no return of the chest tightness.  Allergies  Allergen Reactions  . Lisinopril     REACTION: causes cough  . Sulfa Antibiotics     Told as a child allergic    Current Outpatient Prescriptions  Medication Sig Dispense Refill  . aspirin 81 MG tablet Take 81 mg by mouth daily.        Marland Kitchen atorvastatin (LIPITOR) 40 MG tablet Take 1 tablet (40 mg total) by mouth daily.  30 tablet  11  . b complex vitamins tablet Take 1 tablet by mouth daily.      . fluticasone (FLONASE) 50 MCG/ACT nasal spray Place 2 sprays into the nose daily.  16 g  12  . glimepiride (AMARYL) 4 MG tablet TAKE TWO TABLETS BY MOUTH EVERY DAY BEFORE  BREAKFAST  60 tablet  12  . loratadine (CLARITIN) 10 MG tablet TAKE ONE TABLET BY MOUTH DAILY  AS NEEDED ALLERGY  30 tablet  12  . losartan (COZAAR) 100 MG tablet Take 1 tablet (100 mg total) by mouth daily.  90 tablet  3  . metFORMIN (GLUCOPHAGE) 1000 MG tablet TAKE ONE TABLET BY MOUTH TWICE DAILY  180 tablet  3  . metoprolol (LOPRESSOR) 50 MG tablet TAKE ONE TABLET BY MOUTH TWICE DAILY  60 tablet  12  . MILK THISTLE PO Take 1 tablet by mouth daily.      . Multiple Vitamin (MULTIVITAMIN WITH MINERALS) TABS Take 1 tablet by mouth daily.      . nitroGLYCERIN (NITROSTAT) 0.4 MG SL tablet Place 1 tablet (0.4 mg total) under the tongue every 5 (five) minutes x 3 doses as needed for chest pain.  25 tablet  3  . Polyethyl Glycol-Propyl Glycol (SYSTANE OP) Apply 1  drop to eye 2 (two) times daily as needed (dry eyes).      Marland Kitchen zolpidem (AMBIEN) 10 MG tablet TAKE ONE TABLET BY MOUTH AT BEDTIME AS NEEDED FOR SLEEP  30 tablet  4   No current facility-administered medications for this visit.    History   Social History  . Marital Status: Married    Spouse Name: Ron    Number of Children: 2  . Years of Education: Bachelors   Occupational History  . office assistant    Social History Main Topics  . Smoking status: Never Smoker   . Smokeless tobacco: Never Used  . Alcohol Use: 3.0 oz/week    6 drink(s) per week     Comment: Occasional glass of wine on the weekends  . Drug Use: No  . Sexually Active: Not Currently   Other Topics Concern  . Not on file   Social History Narrative   Health Care POA:    Emergency Contact: husband, Tymeshia Awan (c) [phone]   End of Life Plan:    Who lives with you: husband, Ron, occasionally son who works out of town   Any pets: 2 dogs, 2 cats   Diet: Pt has a varied diet of  protein, starch, vegetables.  Pt is not currently on any kind of diet modifications for DM control.   Exercise: Pt has not regular exercise routine.   Seatbelts: Pt reports wearing seatbelt when in vehicles.    Sun Exposure/Protection: Pt reports not wearing sun protection.   Hobbies: Spending time with granddaughter, reading          Family History  Problem Relation Age of Onset  . Aneurysm Father   . CAD Brother 45    Past Medical History  Diagnosis Date  . Diabetes mellitus   . Hypertension   . Cataract   . Hepatitis C   . Hypertriglyceridemia   . Takotsubo syndrome     October 16, 2012    Past Surgical History  Procedure Laterality Date  . Cholecystectomy    . Eye surgery  02/2012    bilateral cataract surg    Patient Active Problem List   Diagnosis Date Noted  . Mitral regurgitation 10/18/2012  . Takotsubo syndrome 10/18/2012  . HYPERTRIGLYCERIDEMIA 01/25/2009  . INSOMNIA, PERSISTENT 01/25/2009  . DIABETIC  PERIPHERAL NEUROPATHY 04/13/2008  . OBESITY 02/21/2008  . COLONIC POLYPS, HX OF 05/02/2007  . HEPATITIS C 03/07/2007  . DM (diabetes mellitus), type 2, uncontrolled 01/28/2007  . HYPERTENSION, BENIGN ESSENTIAL 01/28/2007    ROS   Patient denies fever, chills, headache, sweats, rash, change in vision, change in hearing, chest pain, cough, nausea vomiting, urinary symptoms. All other systems are reviewed and are negative.  PHYSICAL EXAM  Patient is overweight. She is oriented to person time and place. Affect is normal. Lungs are clear. Respiratory effort is nonlabored. Cardiac exam reveals S1 and S2. There is a systolic murmur that is heard best at her apex and the lateral position. This most probably is her mitral regurgitation. There is a very soft outflow murmur heard. The abdomen is soft. There's no peripheral edema.  Filed Vitals:   11/04/12 1204  BP: 150/72  Pulse: 80  Height: 5\' 2"  (1.575 m)  Weight: 178 lb (80.74 kg)     ASSESSMENT & PLAN

## 2012-11-04 NOTE — Patient Instructions (Signed)
I sent a new prescription for higher dose losartin to [facility].  Check with Dr. Myrtis Ser to make sure he agrees.  Take a full pill daily Keep working on diet and exercise.   I would like to see you in 3 months to check diabetes. Check BP at home - see me sooner if it remains elevated. Get your eye exam and have them send me the report.

## 2012-11-04 NOTE — Telephone Encounter (Signed)
**Note De-Identified Ernesta Trabert Obfuscation** Pt is advised that she has no lifting restrictions at this time, she verbalized understanding.

## 2012-11-11 ENCOUNTER — Other Ambulatory Visit: Payer: Self-pay | Admitting: *Deleted

## 2012-11-11 MED ORDER — ATORVASTATIN CALCIUM 40 MG PO TABS: 40.0000 mg | ORAL_TABLET | Freq: Every day | ORAL | Status: DC

## 2012-11-23 ENCOUNTER — Telehealth: Payer: Self-pay | Admitting: Cardiology

## 2012-11-23 ENCOUNTER — Other Ambulatory Visit: Payer: Self-pay | Admitting: Nurse Practitioner

## 2012-11-23 NOTE — Telephone Encounter (Signed)
Spoke with patient who called in to question whether to increase Metoprolol dosage.  Patient saw Dr. Myrtis Ser on 5/16 and was told at that time to increase her Metoprolol to 75 mg BID and to monitor her pulse and BP.  Patient reports that her heart rate has consistently been > 70 bpm with recordings of 91, 87, 85, 89, 88 on most days.  Patient reports that on 4 different days her heart rate was just below 70.  Patient reports blood pressures mostly in the 140-150's over 70-80s.  The lowest BP that the patient reports is 130/60 and this occurred on one occasion. Per Dr. Henrietta Hoover instructions patient will begin Metoprolol (Lopressor) 100 mg BID today and will notify us if pulse falls below 70 bpm.  I advised patient to check pulse more than once per day as she has been doing.

## 2012-11-23 NOTE — Telephone Encounter (Signed)
New problem    Pt calling about increasing dosage of metoprolol due to pulse being more than 70

## 2012-12-02 ENCOUNTER — Other Ambulatory Visit: Payer: Self-pay | Admitting: Family Medicine

## 2012-12-22 ENCOUNTER — Encounter: Payer: Self-pay | Admitting: Internal Medicine

## 2012-12-26 ENCOUNTER — Other Ambulatory Visit: Payer: Self-pay | Admitting: Family Medicine

## 2012-12-26 NOTE — Telephone Encounter (Signed)
Just called in

## 2012-12-27 ENCOUNTER — Other Ambulatory Visit: Payer: Self-pay | Admitting: *Deleted

## 2012-12-27 MED ORDER — LORATADINE 10 MG PO TABS: ORAL_TABLET | ORAL | Status: DC

## 2012-12-29 ENCOUNTER — Other Ambulatory Visit: Payer: Self-pay

## 2013-01-09 ENCOUNTER — Encounter: Payer: Self-pay | Admitting: Home Health Services

## 2013-01-12 ENCOUNTER — Ambulatory Visit (INDEPENDENT_AMBULATORY_CARE_PROVIDER_SITE_OTHER): Payer: Medicare Other | Admitting: Cardiology

## 2013-01-12 ENCOUNTER — Encounter: Payer: Self-pay | Admitting: Cardiology

## 2013-01-12 VITALS — BP 128/88 | HR 69 | Ht 62.0 in | Wt 180.0 lb

## 2013-01-12 DIAGNOSIS — I059 Rheumatic mitral valve disease, unspecified: Secondary | ICD-10-CM

## 2013-01-12 DIAGNOSIS — I5181 Takotsubo syndrome: Secondary | ICD-10-CM | POA: Diagnosis not present

## 2013-01-12 DIAGNOSIS — I340 Nonrheumatic mitral (valve) insufficiency: Secondary | ICD-10-CM

## 2013-01-12 NOTE — Progress Notes (Signed)
Patient ID: Becky Fields, female   DOB: 1945-10-23, 67 y.o.   MRN: [id number]   HPI  Patient is seen back to followup her Takotsubo event in April, 2014. She had marked decreased motion at the apex. She had vigorous function at the base with some outflow tract obstruction and mitral regurgitation. We have adjusted her meds carefully. She's feeling well.  Allergies  Allergen Reactions  . Lisinopril     REACTION: causes cough  . Sulfa Antibiotics     Told as a child allergic    Current Outpatient Prescriptions  Medication Sig Dispense Refill  . aspirin 81 MG tablet Take 81 mg by mouth daily.        Marland Kitchen atorvastatin (LIPITOR) 40 MG tablet Take 1 tablet (40 mg total) by mouth daily.  30 tablet  11  . b complex vitamins tablet Take 1 tablet by mouth daily.      . fluticasone (FLONASE) 50 MCG/ACT nasal spray Place 2 sprays into the nose daily.  16 g  12  . glimepiride (AMARYL) 4 MG tablet TAKE TWO TABLETS BY MOUTH EVERY DAY BEFORE  BREAKFAST  60 tablet  12  . loratadine (CLARITIN) 10 MG tablet TAKE ONE TABLET BY MOUTH DAILY AS NEEDED FOR  ALLERGY  30 tablet  12  . metFORMIN (GLUCOPHAGE) 1000 MG tablet TAKE ONE TABLET BY MOUTH TWICE DAILY  180 tablet  3  . metoprolol (LOPRESSOR) 50 MG tablet Take 1.5 tablets (75 mg total) by mouth 2 (two) times daily. Take 100 mg bid only if pulse is greater than 70  100 tablet  3  . MILK THISTLE PO Take 1 tablet by mouth daily.      . Multiple Vitamin (MULTIVITAMIN WITH MINERALS) TABS Take 1 tablet by mouth daily.      . nitroGLYCERIN (NITROSTAT) 0.4 MG SL tablet Place 1 tablet (0.4 mg total) under the tongue every 5 (five) minutes x 3 doses as needed for chest pain.  25 tablet  3  . Polyethyl Glycol-Propyl Glycol (SYSTANE OP) Apply 1 drop to eye 2 (two) times daily as needed (dry eyes).      Marland Kitchen zolpidem (AMBIEN) 10 MG tablet TAKE ONE TABLET BY MOUTH AT BEDTIME AS NEEDED FOR SLEEP  30 tablet  3   No current facility-administered medications for this visit.     History   Social History  . Marital Status: Married    Spouse Name: Becky Fields    Number of Children: 2  . Years of Education: Bachelors   Occupational History  . office assistant    Social History Main Topics  . Smoking status: Never Smoker   . Smokeless tobacco: Never Used  . Alcohol Use: 3.0 oz/week    6 drink(s) per week     Comment: Occasional glass of wine on the weekends  . Drug Use: No  . Sexually Active: Not Currently   Other Topics Concern  . Not on file   Social History Narrative   Health Care POA:    Emergency Contact: husband, Becky Fields (c) [phone]   End of Life Plan:    Who lives with you: husband, Becky Fields, occasionally son who works out of town   Any pets: 2 dogs, 2 cats   Diet: Pt has a varied diet of protein, starch, vegetables.  Pt is not currently on any kind of diet modifications for DM control.   Exercise: Pt has not regular exercise routine.   Seatbelts: Pt reports wearing seatbelt when in  vehicles.    Sun Exposure/Protection: Pt reports not wearing sun protection.   Hobbies: Spending time with granddaughter, reading          Family History  Problem Relation Age of Onset  . Aneurysm Father   . CAD Brother 46    Past Medical History  Diagnosis Date  . Diabetes mellitus   . Hypertension   . Cataract   . Hepatitis C   . Hypertriglyceridemia   . Takotsubo syndrome     October 16, 2012    Past Surgical History  Procedure Laterality Date  . Cholecystectomy    . Eye surgery  02/2012    bilateral cataract surg    Patient Active Problem List   Diagnosis Date Noted  . Mitral regurgitation 10/18/2012  . Takotsubo syndrome 10/18/2012  . HYPERTRIGLYCERIDEMIA 01/25/2009  . INSOMNIA, PERSISTENT 01/25/2009  . DIABETIC PERIPHERAL NEUROPATHY 04/13/2008  . OBESITY 02/21/2008  . COLONIC POLYPS, HX OF 05/02/2007  . HEPATITIS C 03/07/2007  . DM (diabetes mellitus), type 2, uncontrolled 01/28/2007  . HYPERTENSION, BENIGN ESSENTIAL 01/28/2007    ROS    Patient denies fever, chills, headache, sweats, rash, change in vision, change in hearing, chest pain, cough, nausea vomiting, urinary symptoms. All other systems are reviewed and are negative.  PHYSICAL EXAM   Patient is oriented to person time and place. Affect is normal. There is no jugulovenous distention. Lungs are clear. Respiratory effort is nonlabored. Cardiac exam reveals S1 and S2. There no clicks. There is a soft systolic murmur. The abdomen is soft. Is no peripheral edema.  Filed Vitals:   01/12/13 1505  BP: 128/88  Pulse: 69  Height: 5\' 2"  (1.575 m)  Weight: 180 lb (81.647 kg)  SpO2: 97%     ASSESSMENT & PLAN

## 2013-01-12 NOTE — Assessment & Plan Note (Signed)
I am hoping that as her apex improves the base of her heart will be less vigorous and her mitral regurgitation will be gone. She needs a followup echo.

## 2013-01-12 NOTE — Assessment & Plan Note (Signed)
It is now time for follow up echo. This will be scheduled. I will be in touch with her with the result. In the meantime she'll be scheduled to see me back in 3 months. No change in her medications.

## 2013-01-12 NOTE — Patient Instructions (Addendum)
PLEASE SCHEDULE ECHO AT YOUR CONVEINCE ;DX TAKOSUBO CARDIOMYOPATHY; ORIGINALLY HAD MR AND  LVOT OBSTRUCTION PER DR. KATZ.  PLEASE FOLLOW UP WITH DR. KATZ IN 3 MONTHS  NO CHANGES WITH MEDICATIONS TODAY

## 2013-01-18 DIAGNOSIS — E119 Type 2 diabetes mellitus without complications: Secondary | ICD-10-CM | POA: Diagnosis not present

## 2013-01-19 ENCOUNTER — Ambulatory Visit (HOSPITAL_COMMUNITY): Payer: Medicare Other | Attending: Cardiology

## 2013-01-19 DIAGNOSIS — I5181 Takotsubo syndrome: Secondary | ICD-10-CM | POA: Diagnosis not present

## 2013-01-19 DIAGNOSIS — B1920 Unspecified viral hepatitis C without hepatic coma: Secondary | ICD-10-CM | POA: Insufficient documentation

## 2013-01-19 DIAGNOSIS — E1142 Type 2 diabetes mellitus with diabetic polyneuropathy: Secondary | ICD-10-CM | POA: Insufficient documentation

## 2013-01-19 DIAGNOSIS — E1149 Type 2 diabetes mellitus with other diabetic neurological complication: Secondary | ICD-10-CM | POA: Diagnosis not present

## 2013-01-19 DIAGNOSIS — I379 Nonrheumatic pulmonary valve disorder, unspecified: Secondary | ICD-10-CM | POA: Insufficient documentation

## 2013-01-19 DIAGNOSIS — E669 Obesity, unspecified: Secondary | ICD-10-CM | POA: Insufficient documentation

## 2013-01-19 DIAGNOSIS — I340 Nonrheumatic mitral (valve) insufficiency: Secondary | ICD-10-CM

## 2013-01-19 DIAGNOSIS — I10 Essential (primary) hypertension: Secondary | ICD-10-CM | POA: Insufficient documentation

## 2013-01-19 DIAGNOSIS — I059 Rheumatic mitral valve disease, unspecified: Secondary | ICD-10-CM | POA: Diagnosis not present

## 2013-01-19 DIAGNOSIS — I079 Rheumatic tricuspid valve disease, unspecified: Secondary | ICD-10-CM | POA: Insufficient documentation

## 2013-01-19 NOTE — Progress Notes (Signed)
Echocardiogram performed.  

## 2013-01-24 ENCOUNTER — Encounter: Payer: Self-pay | Admitting: Cardiology

## 2013-01-24 DIAGNOSIS — IMO0002 Reserved for concepts with insufficient information to code with codable children: Secondary | ICD-10-CM | POA: Insufficient documentation

## 2013-01-24 DIAGNOSIS — R9430 Abnormal result of cardiovascular function study, unspecified: Secondary | ICD-10-CM | POA: Insufficient documentation

## 2013-02-03 ENCOUNTER — Ambulatory Visit: Payer: Medicare Other | Admitting: Family Medicine

## 2013-02-24 ENCOUNTER — Encounter: Payer: Self-pay | Admitting: Family Medicine

## 2013-02-24 ENCOUNTER — Ambulatory Visit (INDEPENDENT_AMBULATORY_CARE_PROVIDER_SITE_OTHER): Payer: Medicare Other | Admitting: Family Medicine

## 2013-02-24 VITALS — BP 190/88 | HR 64 | Temp 98.1°F | Ht 62.0 in | Wt 182.3 lb

## 2013-02-24 DIAGNOSIS — Z23 Encounter for immunization: Secondary | ICD-10-CM

## 2013-02-24 DIAGNOSIS — IMO0001 Reserved for inherently not codable concepts without codable children: Secondary | ICD-10-CM | POA: Diagnosis not present

## 2013-02-24 DIAGNOSIS — E1165 Type 2 diabetes mellitus with hyperglycemia: Secondary | ICD-10-CM

## 2013-02-24 DIAGNOSIS — I10 Essential (primary) hypertension: Secondary | ICD-10-CM

## 2013-02-24 LAB — POCT GLYCOSYLATED HEMOGLOBIN (HGB A1C): Hemoglobin A1C: 7

## 2013-02-24 MED ORDER — LOSARTAN POTASSIUM 100 MG PO TABS: 100.0000 mg | ORAL_TABLET | Freq: Every day | ORAL | Status: DC

## 2013-02-24 NOTE — Progress Notes (Signed)
  Subjective:    Patient ID: Becky Fields, female    DOB: 03/15/1946, 67 y.o.   MRN: [id number]  HPI  Recheck DM and HBP.  She is feeling great.  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  Chronic stress remains high.  She is dwelling less on her problems.  No change in weight or diet.  Taking prescribed meds - especially BP. No CP or SOB    Review of Systems     Objective:   Physical Exam Markedly elevated BP noted and confirmed. Lungs clear Cardiac RRR without m or g         Assessment & Plan:

## 2013-02-24 NOTE — Assessment & Plan Note (Signed)
Poor control and we don't fully understand why.  I suspect her improved cardiac output plays a role and she is just returning to her pre CHF state.  Restart ARB.

## 2013-02-24 NOTE — Patient Instructions (Addendum)
I am delighted your broken heart has healed. Your blood pressure is way up. Please check your blood pressure at home and bring in a log. I am restarting your cozaar/losartin and sent a prescription to [facility].  If you have any at home, go ahead and start taking it. See me in two weeks to recheck that your blood pressure is actually coming down. You got a flu shot today. I will call with A1C results.

## 2013-02-24 NOTE — Assessment & Plan Note (Signed)
Now good control

## 2013-03-10 ENCOUNTER — Ambulatory Visit (INDEPENDENT_AMBULATORY_CARE_PROVIDER_SITE_OTHER): Payer: Medicare Other | Admitting: Family Medicine

## 2013-03-10 ENCOUNTER — Encounter: Payer: Self-pay | Admitting: Family Medicine

## 2013-03-10 VITALS — BP 171/83 | HR 68 | Temp 98.1°F | Ht 62.0 in | Wt 179.0 lb

## 2013-03-10 DIAGNOSIS — I10 Essential (primary) hypertension: Secondary | ICD-10-CM | POA: Diagnosis not present

## 2013-03-10 DIAGNOSIS — R109 Unspecified abdominal pain: Secondary | ICD-10-CM

## 2013-03-10 DIAGNOSIS — E119 Type 2 diabetes mellitus without complications: Secondary | ICD-10-CM | POA: Diagnosis not present

## 2013-03-10 LAB — POCT UA - MICROSCOPIC ONLY

## 2013-03-10 LAB — POCT URINALYSIS DIPSTICK
Glucose, UA: NEGATIVE
Ketones, UA: NEGATIVE
Protein, UA: NEGATIVE
Urobilinogen, UA: 1

## 2013-03-10 MED ORDER — LORATADINE 10 MG PO TABS: ORAL_TABLET | ORAL | Status: DC

## 2013-03-10 MED ORDER — METOPROLOL TARTRATE 100 MG PO TABS: 100.0000 mg | ORAL_TABLET | Freq: Two times a day (BID) | ORAL | Status: DC

## 2013-03-10 MED ORDER — ATORVASTATIN CALCIUM 40 MG PO TABS: 40.0000 mg | ORAL_TABLET | Freq: Every day | ORAL | Status: DC

## 2013-03-10 MED ORDER — GLIMEPIRIDE 4 MG PO TABS: ORAL_TABLET | ORAL | Status: DC

## 2013-03-10 MED ORDER — LOSARTAN POTASSIUM 100 MG PO TABS: 100.0000 mg | ORAL_TABLET | Freq: Every day | ORAL | Status: DC

## 2013-03-10 NOTE — Assessment & Plan Note (Signed)
At goal.  

## 2013-03-10 NOTE — Progress Notes (Signed)
  Subjective:    Patient ID: Becky Fields, female    DOB: Nov 11, 1945, 67 y.o.   MRN: [id number]  HPI Recheck HBP.  Home BPs still elevated.  No CP or SOB   Review of Systems     Objective:   Physical Exam Cardiac RRR without m or g Lungs clear Ext no edema        Assessment & Plan:

## 2013-03-10 NOTE — Assessment & Plan Note (Signed)
Still poor control.  Increase metoprolol

## 2013-03-10 NOTE — Patient Instructions (Signed)
I sent in 90 day supply for all your meds- I think. I increased your metoprolol to 100 mg twice a day. Check the medication list against what you are taking.  Notify me if any differences. If you blood pressure is good, see me in 3-6 months. If blood pressure not good, see me in 1 month.

## 2013-04-20 ENCOUNTER — Other Ambulatory Visit: Payer: Self-pay | Admitting: Family Medicine

## 2013-05-05 ENCOUNTER — Encounter: Payer: Self-pay | Admitting: Cardiology

## 2013-05-05 ENCOUNTER — Ambulatory Visit: Payer: Medicare Other | Admitting: Cardiology

## 2013-05-05 ENCOUNTER — Encounter (INDEPENDENT_AMBULATORY_CARE_PROVIDER_SITE_OTHER): Payer: Self-pay

## 2013-05-05 ENCOUNTER — Ambulatory Visit (INDEPENDENT_AMBULATORY_CARE_PROVIDER_SITE_OTHER): Payer: Medicare Other | Admitting: Cardiology

## 2013-05-05 VITALS — BP 144/90 | HR 68 | Ht 62.0 in | Wt 181.0 lb

## 2013-05-05 DIAGNOSIS — I340 Nonrheumatic mitral (valve) insufficiency: Secondary | ICD-10-CM

## 2013-05-05 DIAGNOSIS — I5181 Takotsubo syndrome: Secondary | ICD-10-CM

## 2013-05-05 DIAGNOSIS — I10 Essential (primary) hypertension: Secondary | ICD-10-CM | POA: Diagnosis not present

## 2013-05-05 DIAGNOSIS — I059 Rheumatic mitral valve disease, unspecified: Secondary | ICD-10-CM | POA: Diagnosis not present

## 2013-05-05 NOTE — Assessment & Plan Note (Signed)
She has had excellent recovery. No change in therapy.

## 2013-05-05 NOTE — Progress Notes (Signed)
HPI  Patient is seen today to followup a prior episode of Takotsubo. This occurred in April, 2014. At that time she had vigorous motion at the base that actually lead to some outflow tract obstruction and mitral regurgitation. I saw her in July and we then did a followup echo. This was done January 19, 2013. She had excellent left ventricular function. There were no wall motion abnormalities. There was very slight mitral regurgitation. This showed that she had had excellent recovery from her event.  Allergies  Allergen Reactions  . Lisinopril     REACTION: causes cough  . Sulfa Antibiotics     Told as a child allergic    Current Outpatient Prescriptions  Medication Sig Dispense Refill  . aspirin 81 MG tablet Take 81 mg by mouth daily.        Marland Kitchen atorvastatin (LIPITOR) 40 MG tablet Take 1 tablet (40 mg total) by mouth daily.  90 tablet  3  . b complex vitamins tablet Take 1 tablet by mouth daily.      . fluticasone (FLONASE) 50 MCG/ACT nasal spray Place 2 sprays into the nose daily.  16 g  12  . glimepiride (AMARYL) 4 MG tablet TAKE TWO TABLETS BY MOUTH EVERY DAY BEFORE  BREAKFAST  180 tablet  12  . loratadine (CLARITIN) 10 MG tablet TAKE ONE TABLET BY MOUTH DAILY AS NEEDED FOR  ALLERGY  90 tablet  3  . losartan (COZAAR) 100 MG tablet Take 1 tablet (100 mg total) by mouth daily.  90 tablet  3  . metFORMIN (GLUCOPHAGE) 1000 MG tablet TAKE ONE TABLET BY MOUTH TWICE DAILY  180 tablet  3  . metoprolol (LOPRESSOR) 100 MG tablet Take 1 tablet (100 mg total) by mouth 2 (two) times daily.  180 tablet  3  . MILK THISTLE PO Take 1 tablet by mouth daily.      . Multiple Vitamin (MULTIVITAMIN WITH MINERALS) TABS Take 1 tablet by mouth daily.      . nitroGLYCERIN (NITROSTAT) 0.4 MG SL tablet Place 1 tablet (0.4 mg total) under the tongue every 5 (five) minutes x 3 doses as needed for chest pain.  25 tablet  3  . Polyethyl Glycol-Propyl Glycol (SYSTANE OP) Apply 1 drop to eye 2 (two) times daily as needed  (dry eyes).      Marland Kitchen zolpidem (AMBIEN) 10 MG tablet TAKE ONE TABLET BY MOUTH AT BEDTIME AS NEEDED FOR SLEEP  30 tablet  5   No current facility-administered medications for this visit.    History   Social History  . Marital Status: Married    Spouse Name: Ron    Number of Children: 2  . Years of Education: Bachelors   Occupational History  . office assistant    Social History Main Topics  . Smoking status: Never Smoker   . Smokeless tobacco: Never Used  . Alcohol Use: 3.0 oz/week    6 drink(s) per week     Comment: Occasional glass of wine on the weekends  . Drug Use: No  . Sexual Activity: Not Currently   Other Topics Concern  . Not on file   Social History Narrative   Health Care POA:    Emergency Contact: husband, Arna Luis (c) [phone]   End of Life Plan:    Who lives with you: husband, Ron, occasionally son who works out of town   Any pets: 2 dogs, 2 cats   Diet: Pt has a varied diet of  protein, starch, vegetables.  Pt is not currently on any kind of diet modifications for DM control.   Exercise: Pt has not regular exercise routine.   Seatbelts: Pt reports wearing seatbelt when in vehicles.    Sun Exposure/Protection: Pt reports not wearing sun protection.   Hobbies: Spending time with granddaughter, reading          Family History  Problem Relation Age of Onset  . Aneurysm Father   . CAD Brother 86    Past Medical History  Diagnosis Date  . Diabetes mellitus   . Hypertension   . Cataract   . Hepatitis C   . Hypertriglyceridemia   . Takotsubo syndrome     October 16, 2012  . Ejection fraction     .    Past Surgical History  Procedure Laterality Date  . Cholecystectomy    . Eye surgery  02/2012    bilateral cataract surg    Patient Active Problem List   Diagnosis Date Noted  . Ejection fraction   . Mitral regurgitation 10/18/2012  . Takotsubo syndrome 10/18/2012  . HYPERTRIGLYCERIDEMIA 01/25/2009  . INSOMNIA, PERSISTENT 01/25/2009  .  DIABETIC PERIPHERAL NEUROPATHY 04/13/2008  . OBESITY 02/21/2008  . COLONIC POLYPS, HX OF 05/02/2007  . HEPATITIS C 03/07/2007  . Diabetes mellitus, controlled 01/28/2007  . HYPERTENSION, BENIGN ESSENTIAL 01/28/2007    ROS   Patient denies fever, chills, headache, sweats, rash, change in vision, change in hearing, chest pain, cough, nausea vomiting, urinary symptoms. All other systems are reviewed and are negative.  PHYSICAL EXAM  Patient is stable. She's overweight. She is oriented to person time and place. Affect is normal. There is no jugulovenous distention. Lungs are clear. Respiratory effort is nonlabored. Cardiac exam reveals S1 and S2. There no clicks or significant murmurs. The abdomen is soft. There is no peripheral edema.  Filed Vitals:   05/05/13 1032  BP: 144/90  Pulse: 68  Height: 5\' 2"  (1.575 m)  Weight: 181 lb (82.101 kg)     ASSESSMENT & PLAN

## 2013-05-05 NOTE — Assessment & Plan Note (Signed)
Losartan  has been added for her blood pressure. She is recording her blood pressures from home and will be following up with her primary physician.

## 2013-05-05 NOTE — Patient Instructions (Signed)
**Note De-identified Becky Fields Obfuscation** Your physician recommends that you continue on your current medications as directed. Please refer to the Current Medication list given to you today.  Your physician wants you to follow-up in: 1 year. You will receive a reminder letter in the mail two months in advance. If you don't receive a letter, please call our office to schedule the follow-up appointment.  

## 2013-05-05 NOTE — Assessment & Plan Note (Signed)
Mitral regurgitation had been functional related to the physiology of her Takotsubo event. This resolved when her LV function normalized. No further workup. It is worth considering a followup echo in another 2 years.

## 2013-05-17 ENCOUNTER — Encounter: Payer: Self-pay | Admitting: Family Medicine

## 2013-05-17 ENCOUNTER — Ambulatory Visit (INDEPENDENT_AMBULATORY_CARE_PROVIDER_SITE_OTHER): Payer: Medicare Other | Admitting: Family Medicine

## 2013-05-17 VITALS — BP 146/74 | HR 72 | Temp 98.8°F | Ht 62.0 in | Wt 179.0 lb

## 2013-05-17 DIAGNOSIS — E119 Type 2 diabetes mellitus without complications: Secondary | ICD-10-CM | POA: Diagnosis not present

## 2013-05-17 DIAGNOSIS — I10 Essential (primary) hypertension: Secondary | ICD-10-CM

## 2013-05-17 MED ORDER — HYDROCHLOROTHIAZIDE 12.5 MG PO TABS: 12.5000 mg | ORAL_TABLET | Freq: Every day | ORAL | Status: DC

## 2013-05-17 NOTE — Assessment & Plan Note (Signed)
Important to try to stay on ARB for renal protection.

## 2013-05-17 NOTE — Assessment & Plan Note (Signed)
Add HCTZ.  Then try off losartan and then back on.  Try to determine if causing itching.

## 2013-05-17 NOTE — Patient Instructions (Signed)
I sent in a new prescription for HCTZ or hydrochlorothiazide for your blood pressure. Once you are on the HCTZ, we can stop the losartan for a month to see if it is causing the itching.  Then you will to restart the losartan to prove the cause.   If it is not and your blood pressure is good, you can see me in three months. If we need to stop losartan because of the itching, see me as soon as we figure that out.

## 2013-05-17 NOTE — Progress Notes (Signed)
   Subjective:    Patient ID: Becky Fields, female    DOB: 09-Dec-1945, 67 y.o.   MRN: [id number]  HPI Thinks losartan may be causing itching.  Home BPs running a bit high.  Otherwise fine.      Review of Systems     Objective:   Physical ExamLungs clear Cardiac RRR without m or g Abd benign.        Assessment & Plan:

## 2013-07-13 ENCOUNTER — Telehealth: Payer: Self-pay | Admitting: Family Medicine

## 2013-07-13 NOTE — Telephone Encounter (Signed)
Has question about medicine she is taking for itching Her BP has dropped a lot. Should she drop HCTZ while she is doing this experiment?: Please advise

## 2013-07-14 NOTE — Telephone Encounter (Signed)
Called and told OK to hold HCTZ for now and monitor BP

## 2013-08-16 ENCOUNTER — Encounter: Payer: Self-pay | Admitting: Family Medicine

## 2013-08-16 ENCOUNTER — Ambulatory Visit (INDEPENDENT_AMBULATORY_CARE_PROVIDER_SITE_OTHER): Payer: Medicare Other | Admitting: Family Medicine

## 2013-08-16 VITALS — BP 138/78 | HR 68 | Temp 98.3°F | Ht 62.0 in | Wt 178.5 lb

## 2013-08-16 DIAGNOSIS — E119 Type 2 diabetes mellitus without complications: Secondary | ICD-10-CM | POA: Diagnosis not present

## 2013-08-16 DIAGNOSIS — IMO0001 Reserved for inherently not codable concepts without codable children: Secondary | ICD-10-CM

## 2013-08-16 DIAGNOSIS — I10 Essential (primary) hypertension: Secondary | ICD-10-CM

## 2013-08-16 DIAGNOSIS — IMO0002 Reserved for concepts with insufficient information to code with codable children: Secondary | ICD-10-CM

## 2013-08-16 DIAGNOSIS — E1165 Type 2 diabetes mellitus with hyperglycemia: Secondary | ICD-10-CM

## 2013-08-16 LAB — BASIC METABOLIC PANEL
BUN: 22 mg/dL (ref 6–23)
CALCIUM: 9 mg/dL (ref 8.4–10.5)
CO2: 24 mEq/L (ref 19–32)
CREATININE: 0.77 mg/dL (ref 0.50–1.10)
Chloride: 98 mEq/L (ref 96–112)
Glucose, Bld: 378 mg/dL — ABNORMAL HIGH (ref 70–99)
Potassium: 4.1 mEq/L (ref 3.5–5.3)
Sodium: 131 mEq/L — ABNORMAL LOW (ref 135–145)

## 2013-08-16 LAB — POCT GLYCOSYLATED HEMOGLOBIN (HGB A1C): HEMOGLOBIN A1C: 10.5

## 2013-08-16 NOTE — Progress Notes (Signed)
   Subjective:    Patient ID: Becky Fields, female    DOB: 1946-02-11, 68 y.o.   MRN: [id number]  HPI Tried off losartan and less itching.  Restarted and itching has not returned.  We will continue for now. Stress continues high at home.  Mostly financial DM control is now poor.  States taking meds.  Eating a decent diet.  Not exercising as much in the winter.   States will get mammo Cannot afford zostavax. Had flu like illness in late Dec.  Still with dry cough.    Review of Systems     Objective:   Physical Exam Lungs clear Cardiac RRR without m or g Abd benign EXt no edema       Assessment & Plan:

## 2013-08-16 NOTE — Assessment & Plan Note (Signed)
Good control.  Cont losartan.

## 2013-08-16 NOTE — Assessment & Plan Note (Signed)
Poor control.  One more chance at diet and exercise.  If no effect, next visit, stop glimipride and begin insulin.

## 2013-08-16 NOTE — Patient Instructions (Signed)
Stay on the losartan for now. We need to get the diabetes under much better control.  You have 3 months with diet and exercise.  If I don't see major improvement, I will start you on insulin. Call me in one month if still coughing.  I will order a chest x ray. Drink lots of water with your poorly controled diabetes. See me in three months.  Get fasting blood work before visit but after 10/17/13.  Orders are in the computer.

## 2013-09-11 ENCOUNTER — Telehealth: Payer: Self-pay | Admitting: *Deleted

## 2013-09-11 ENCOUNTER — Encounter: Payer: Self-pay | Admitting: Internal Medicine

## 2013-09-11 NOTE — Telephone Encounter (Signed)
Called and discussed with Dr. Everlene Farrier.  He knows [place] personally.  He, her friends and her co-workers are all very worried about [place] being depressed.  He has urged her to see me to discuss.  He suggests I wait until she reaches out to me for help.

## 2013-09-11 NOTE — Telephone Encounter (Signed)
Received message from Rx line that Dr. Osvaldo Angst from [facility] would like PCP to call regarding pt.  Please call [phone] or office [phone]. Derl Barrow, RN

## 2013-09-12 ENCOUNTER — Telehealth: Payer: Self-pay | Admitting: Family Medicine

## 2013-09-12 DIAGNOSIS — R05 Cough: Secondary | ICD-10-CM

## 2013-09-12 DIAGNOSIS — R059 Cough, unspecified: Secondary | ICD-10-CM

## 2013-09-12 NOTE — Telephone Encounter (Signed)
Pt was told by dr Andria Frames if she was still coughing in a month, he wanted to do a chest xray.  She is still coughing, Please advise

## 2013-09-13 ENCOUNTER — Ambulatory Visit
Admission: RE | Admit: 2013-09-13 | Discharge: 2013-09-13 | Disposition: A | Discharge status: Home / Self Care | Payer: Medicare Other | Source: Ambulatory Visit | Attending: Family Medicine | Admitting: Family Medicine

## 2013-09-13 ENCOUNTER — Encounter: Payer: Self-pay | Admitting: Family Medicine

## 2013-09-13 DIAGNOSIS — R05 Cough: Secondary | ICD-10-CM | POA: Diagnosis not present

## 2013-09-13 DIAGNOSIS — R059 Cough, unspecified: Secondary | ICD-10-CM | POA: Insufficient documentation

## 2013-09-13 NOTE — Telephone Encounter (Signed)
Still with cough (since late Dec)  Otherwise feeling better.  BP is great.  BS needs improvement.  Symptoms include lethargy on mild DOE.  Scheduled for appointment in one month.  Will get CXR now and told to push appointment sooner if sx worsen.  She wants the time to continue with her lifestyle changes.

## 2013-09-13 NOTE — Telephone Encounter (Signed)
Spoke with patient and informed her that she can either walk in to [facility] or [facility] for the x-ray. I gave her the phone number

## 2013-09-25 ENCOUNTER — Other Ambulatory Visit: Payer: Self-pay

## 2013-09-25 ENCOUNTER — Other Ambulatory Visit: Payer: Self-pay | Admitting: *Deleted

## 2013-09-25 DIAGNOSIS — Z1231 Encounter for screening mammogram for malignant neoplasm of breast: Secondary | ICD-10-CM

## 2013-09-25 MED ORDER — METFORMIN HCL 1000 MG PO TABS: ORAL_TABLET | ORAL | Status: DC

## 2013-10-05 ENCOUNTER — Ambulatory Visit: Payer: Medicare Other

## 2013-10-24 ENCOUNTER — Other Ambulatory Visit: Payer: Self-pay | Admitting: Family Medicine

## 2013-10-24 ENCOUNTER — Ambulatory Visit
Admission: RE | Admit: 2013-10-24 | Discharge: 2013-10-24 | Disposition: A | Discharge status: Home / Self Care | Payer: Medicare Other | Source: Ambulatory Visit

## 2013-10-24 DIAGNOSIS — Z1231 Encounter for screening mammogram for malignant neoplasm of breast: Secondary | ICD-10-CM

## 2013-10-24 NOTE — Telephone Encounter (Signed)
Rx called in and patient informed.Becky Fields '

## 2013-10-24 NOTE — Telephone Encounter (Signed)
Dear [name] Team Please call in [facility]! Dickie La

## 2013-11-06 ENCOUNTER — Other Ambulatory Visit: Payer: Self-pay | Admitting: *Deleted

## 2013-11-06 MED ORDER — FLUTICASONE PROPIONATE 50 MCG/ACT NA SUSP: 2.0000 | Freq: Every day | NASAL | Status: DC

## 2013-11-21 ENCOUNTER — Other Ambulatory Visit: Payer: Self-pay | Admitting: *Deleted

## 2013-11-21 MED ORDER — ZOLPIDEM TARTRATE 10 MG PO TABS: ORAL_TABLET | ORAL | Status: DC

## 2013-11-24 ENCOUNTER — Other Ambulatory Visit: Payer: Medicare Other

## 2013-11-24 DIAGNOSIS — E1165 Type 2 diabetes mellitus with hyperglycemia: Secondary | ICD-10-CM

## 2013-11-24 DIAGNOSIS — IMO0002 Reserved for concepts with insufficient information to code with codable children: Secondary | ICD-10-CM

## 2013-11-24 DIAGNOSIS — E119 Type 2 diabetes mellitus without complications: Secondary | ICD-10-CM | POA: Diagnosis not present

## 2013-11-24 LAB — COMPLETE METABOLIC PANEL WITH GFR
ALBUMIN: 3.2 g/dL — AB (ref 3.5–5.2)
ALK PHOS: 58 U/L (ref 39–117)
ALT: 86 U/L — AB (ref 0–35)
AST: 80 U/L — ABNORMAL HIGH (ref 0–37)
BUN: 26 mg/dL — ABNORMAL HIGH (ref 6–23)
CO2: 23 mEq/L (ref 19–32)
Calcium: 8.6 mg/dL (ref 8.4–10.5)
Chloride: 102 mEq/L (ref 96–112)
Creat: 0.82 mg/dL (ref 0.50–1.10)
GFR, EST NON AFRICAN AMERICAN: 74 mL/min
GFR, Est African American: 86 mL/min
GLUCOSE: 243 mg/dL — AB (ref 70–99)
Potassium: 4.3 mEq/L (ref 3.5–5.3)
SODIUM: 136 meq/L (ref 135–145)
TOTAL PROTEIN: 6.5 g/dL (ref 6.0–8.3)
Total Bilirubin: 0.7 mg/dL (ref 0.2–1.2)

## 2013-11-24 LAB — LIPID PANEL
CHOL/HDL RATIO: 3.3 ratio
Cholesterol: 82 mg/dL (ref 0–200)
HDL: 25 mg/dL — ABNORMAL LOW (ref >39)
LDL CALC: 29 mg/dL (ref 0–99)
Triglycerides: 142 mg/dL (ref <150)
VLDL: 28 mg/dL (ref 0–40)

## 2013-11-24 NOTE — Progress Notes (Signed)
CMP AND LIPID DONE TODAY Becky Fields

## 2013-11-27 NOTE — Progress Notes (Signed)
Patient ID: Becky Fields, female   DOB: 1945-07-19, 68 y.o.   MRN: [id number] Called to confirm that she has a visit planned with me.  She will make an appointment today.  She knows that "my numbers are bad."

## 2013-12-14 DIAGNOSIS — H524 Presbyopia: Secondary | ICD-10-CM | POA: Diagnosis not present

## 2013-12-14 DIAGNOSIS — H52229 Regular astigmatism, unspecified eye: Secondary | ICD-10-CM | POA: Diagnosis not present

## 2013-12-14 DIAGNOSIS — H5200 Hypermetropia, unspecified eye: Secondary | ICD-10-CM | POA: Diagnosis not present

## 2013-12-14 DIAGNOSIS — IMO0001 Reserved for inherently not codable concepts without codable children: Secondary | ICD-10-CM | POA: Diagnosis not present

## 2013-12-14 DIAGNOSIS — E119 Type 2 diabetes mellitus without complications: Secondary | ICD-10-CM | POA: Diagnosis not present

## 2013-12-15 ENCOUNTER — Encounter: Payer: Self-pay | Admitting: Family Medicine

## 2013-12-15 ENCOUNTER — Ambulatory Visit (INDEPENDENT_AMBULATORY_CARE_PROVIDER_SITE_OTHER): Payer: Medicare Other | Admitting: Family Medicine

## 2013-12-15 VITALS — BP 125/55 | HR 67 | Temp 97.2°F | Ht 62.0 in | Wt 173.3 lb

## 2013-12-15 DIAGNOSIS — IMO0001 Reserved for inherently not codable concepts without codable children: Secondary | ICD-10-CM

## 2013-12-15 DIAGNOSIS — IMO0002 Reserved for concepts with insufficient information to code with codable children: Secondary | ICD-10-CM

## 2013-12-15 DIAGNOSIS — Z23 Encounter for immunization: Secondary | ICD-10-CM

## 2013-12-15 DIAGNOSIS — I10 Essential (primary) hypertension: Secondary | ICD-10-CM | POA: Diagnosis not present

## 2013-12-15 DIAGNOSIS — B1710 Acute hepatitis C without hepatic coma: Secondary | ICD-10-CM

## 2013-12-15 DIAGNOSIS — E1165 Type 2 diabetes mellitus with hyperglycemia: Secondary | ICD-10-CM

## 2013-12-15 LAB — POCT GLYCOSYLATED HEMOGLOBIN (HGB A1C): Hemoglobin A1C: 10.2

## 2013-12-15 MED ORDER — SITAGLIPTIN PHOSPHATE 100 MG PO TABS: 100.0000 mg | ORAL_TABLET | Freq: Every day | ORAL | Status: DC

## 2013-12-15 NOTE — Assessment & Plan Note (Signed)
Good control

## 2013-12-15 NOTE — Assessment & Plan Note (Signed)
Discussed new hep C treatment.  No way she could afford and unlikely to qualify for coverage.  She will hold off  For now.

## 2013-12-15 NOTE — Patient Instructions (Signed)
Two issues Diabetes poor control.  Let's see if you can afford the Januvia and it works.  If not, we will do the insulin Hepatitis C.  There are new but very expensive meds. Let me know if/when your want me to refer you to our infectious disease folks to see if you qualify. Everything else looks great.   Good job on the weight loss

## 2013-12-15 NOTE — Progress Notes (Signed)
   Subjective:    Patient ID: Becky Fields, female    DOB: [birth year], 68 y.o.   MRN: [id number]  HPI Doing well in many regards. Wt slowly coming down. BP under good control No chest pain and only mild DOE.  No ankle swelling. DM control poor.Would like to avoid insulin/shots. She remains with limited financial resources.       Review of Systems     Objective:   Physical Exam Neck supple Lungs clear Cardiac RRR without m or g Abd benign.         Assessment & Plan:

## 2013-12-15 NOTE — Assessment & Plan Note (Signed)
Poor control.  She will try to afford [place].  If not, insulin.

## 2013-12-19 ENCOUNTER — Encounter: Payer: Self-pay | Admitting: Internal Medicine

## 2013-12-19 ENCOUNTER — Telehealth: Payer: Self-pay | Admitting: Family Medicine

## 2013-12-19 NOTE — Telephone Encounter (Signed)
Is she just adding Juneva or should she stop taking something? Please advise

## 2013-12-21 NOTE — Telephone Encounter (Signed)
Dear [name] Team It looks like he is ADDING Becky Fields! [name]

## 2013-12-21 NOTE — Telephone Encounter (Signed)
Spoke with patient and informed her of below 

## 2013-12-25 ENCOUNTER — Other Ambulatory Visit: Payer: Self-pay | Admitting: *Deleted

## 2013-12-25 MED ORDER — NITROGLYCERIN 0.4 MG SL SUBL: 0.4000 mg | SUBLINGUAL_TABLET | SUBLINGUAL | Status: DC | PRN

## 2014-01-31 ENCOUNTER — Other Ambulatory Visit: Payer: Self-pay | Admitting: Family Medicine

## 2014-02-01 ENCOUNTER — Other Ambulatory Visit: Payer: Self-pay | Admitting: *Deleted

## 2014-02-01 MED ORDER — LORATADINE 10 MG PO TABS: ORAL_TABLET | ORAL | Status: DC

## 2014-02-21 ENCOUNTER — Ambulatory Visit (INDEPENDENT_AMBULATORY_CARE_PROVIDER_SITE_OTHER): Payer: Medicare Other | Admitting: Family Medicine

## 2014-02-21 VITALS — BP 118/63 | HR 82 | Temp 98.1°F | Ht 62.0 in | Wt 176.0 lb

## 2014-02-21 DIAGNOSIS — Z23 Encounter for immunization: Secondary | ICD-10-CM | POA: Diagnosis not present

## 2014-02-21 DIAGNOSIS — IMO0001 Reserved for inherently not codable concepts without codable children: Secondary | ICD-10-CM | POA: Diagnosis not present

## 2014-02-21 DIAGNOSIS — L600 Ingrowing nail: Secondary | ICD-10-CM | POA: Diagnosis not present

## 2014-02-21 DIAGNOSIS — B1710 Acute hepatitis C without hepatic coma: Secondary | ICD-10-CM | POA: Diagnosis not present

## 2014-02-21 DIAGNOSIS — IMO0002 Reserved for concepts with insufficient information to code with codable children: Secondary | ICD-10-CM

## 2014-02-21 DIAGNOSIS — E1165 Type 2 diabetes mellitus with hyperglycemia: Secondary | ICD-10-CM

## 2014-02-21 LAB — POCT GLYCOSYLATED HEMOGLOBIN (HGB A1C): HEMOGLOBIN A1C: 7.4

## 2014-02-21 MED ORDER — TRAMADOL HCL 50 MG PO TABS: 50.0000 mg | ORAL_TABLET | Freq: Three times a day (TID) | ORAL | Status: DC | PRN

## 2014-02-21 MED ORDER — DOXYCYCLINE HYCLATE 100 MG PO TABS: 100.0000 mg | ORAL_TABLET | Freq: Two times a day (BID) | ORAL | Status: DC

## 2014-02-21 MED ORDER — ZOSTER VACCINE LIVE 19400 UNT/0.65ML ~~LOC~~ SOLR: 0.6500 mL | Freq: Once | SUBCUTANEOUS | Status: DC

## 2014-02-21 NOTE — Patient Instructions (Addendum)
Let me know if this does not heal up the way we expect. I sent in some pain meds and 5 days worth of antibiotics. I sent in the shingles vaccine - let me know if you get it. Flut shot today. I put in a referral to our infectious disease group to treat the hepatitis C

## 2014-02-22 ENCOUNTER — Encounter: Payer: Self-pay | Admitting: Family Medicine

## 2014-02-22 NOTE — Assessment & Plan Note (Signed)
Control very nicely improved.

## 2014-02-22 NOTE — Progress Notes (Signed)
   Subjective:    Patient ID: Becky Fields, female    DOB: 05/17/1946, 68 y.o.   MRN: [id number]  HPI Patient with painful ingrown right great toenail for 1 month.  She has tried trimming and soaking without relief.  Pain is completely localized to toe.  Nothing in foot.  No systemic symptoms of fever  Also, husband died semi unexpectedly.  Apparently had lymphoma, remission for 5 years, recently diagnosed with recurrence.  Died in his sleep.  She is coping OK.  He was resisting undergoing chemo again for the treatment of his lymphoma - she thinks this might be for the best.      Review of Systems     Objective:   Physical Exam  Ingrown, locally infected medial aspect of right great toe.  No foot cellulitis.  Good pulses  Informed consent and time out.  Digital block with 1% xylocaine.  Medial 1/3 of nail removed.  Shard was ingrown into skin.          Assessment & Plan:

## 2014-02-22 NOTE — Assessment & Plan Note (Signed)
Removed and treat with short course doxy.

## 2014-02-22 NOTE — Assessment & Plan Note (Signed)
Refer to ID to see if she can be covered for the new Hep C treatment.

## 2014-02-23 ENCOUNTER — Other Ambulatory Visit: Payer: Self-pay | Admitting: *Deleted

## 2014-02-23 DIAGNOSIS — I10 Essential (primary) hypertension: Secondary | ICD-10-CM

## 2014-02-23 MED ORDER — METOPROLOL TARTRATE 100 MG PO TABS
100.0000 mg | ORAL_TABLET | Freq: Two times a day (BID) | ORAL | Status: DC
Start: 2014-02-23 — End: 2015-03-22

## 2014-03-01 ENCOUNTER — Other Ambulatory Visit: Payer: Self-pay | Admitting: *Deleted

## 2014-03-01 DIAGNOSIS — I10 Essential (primary) hypertension: Secondary | ICD-10-CM

## 2014-03-01 MED ORDER — ATORVASTATIN CALCIUM 40 MG PO TABS: 40.0000 mg | ORAL_TABLET | Freq: Every day | ORAL | Status: DC

## 2014-03-06 ENCOUNTER — Other Ambulatory Visit: Payer: Medicare Other

## 2014-03-15 ENCOUNTER — Other Ambulatory Visit: Payer: Self-pay | Admitting: *Deleted

## 2014-03-15 DIAGNOSIS — I10 Essential (primary) hypertension: Secondary | ICD-10-CM

## 2014-03-16 MED ORDER — LOSARTAN POTASSIUM 100 MG PO TABS: 100.0000 mg | ORAL_TABLET | Freq: Every day | ORAL | Status: DC

## 2014-03-19 ENCOUNTER — Encounter: Payer: Medicare Other | Admitting: Internal Medicine

## 2014-03-21 ENCOUNTER — Other Ambulatory Visit: Payer: Medicare Other

## 2014-03-21 DIAGNOSIS — B182 Chronic viral hepatitis C: Secondary | ICD-10-CM | POA: Diagnosis not present

## 2014-03-22 LAB — COMPREHENSIVE METABOLIC PANEL
ALBUMIN: 3.9 g/dL (ref 3.5–5.2)
ALT: 67 U/L — ABNORMAL HIGH (ref 0–35)
AST: 69 U/L — ABNORMAL HIGH (ref 0–37)
Alkaline Phosphatase: 63 U/L (ref 39–117)
BUN: 27 mg/dL — AB (ref 6–23)
CHLORIDE: 104 meq/L (ref 96–112)
CO2: 23 mEq/L (ref 19–32)
Calcium: 9.6 mg/dL (ref 8.4–10.5)
Creat: 0.94 mg/dL (ref 0.50–1.10)
GLUCOSE: 141 mg/dL — AB (ref 70–99)
Potassium: 4.8 mEq/L (ref 3.5–5.3)
Sodium: 137 mEq/L (ref 135–145)
Total Bilirubin: 0.5 mg/dL (ref 0.2–1.2)
Total Protein: 7.8 g/dL (ref 6.0–8.3)

## 2014-03-22 LAB — CBC WITH DIFFERENTIAL/PLATELET
Basophils Absolute: 0.1 10*3/uL (ref 0.0–0.1)
Basophils Relative: 1 % (ref 0–1)
Eosinophils Absolute: 0.2 10*3/uL (ref 0.0–0.7)
Eosinophils Relative: 4 % (ref 0–5)
HEMATOCRIT: 33.2 % — AB (ref 36.0–46.0)
HEMOGLOBIN: 10.4 g/dL — AB (ref 12.0–15.0)
LYMPHS ABS: 2.2 10*3/uL (ref 0.7–4.0)
LYMPHS PCT: 36 % (ref 12–46)
MCH: 23.9 pg — ABNORMAL LOW (ref 26.0–34.0)
MCHC: 31.3 g/dL (ref 30.0–36.0)
MCV: 76.1 fL — ABNORMAL LOW (ref 78.0–100.0)
MONO ABS: 1.1 10*3/uL — AB (ref 0.1–1.0)
MONOS PCT: 18 % — AB (ref 3–12)
NEUTROS PCT: 41 % — AB (ref 43–77)
Neutro Abs: 2.5 10*3/uL (ref 1.7–7.7)
Platelets: 201 10*3/uL (ref 150–400)
RBC: 4.36 MIL/uL (ref 3.87–5.11)
RDW: 16.2 % — ABNORMAL HIGH (ref 11.5–15.5)
WBC: 6 10*3/uL (ref 4.0–10.5)

## 2014-03-22 LAB — ANA: ANA: NEGATIVE

## 2014-03-22 LAB — HEPATITIS C RNA QUANTITATIVE
HCV Quantitative Log: 6.53 {Log} — ABNORMAL HIGH (ref <1.18)
HCV Quantitative: 3368436 IU/mL — ABNORMAL HIGH (ref <15)

## 2014-03-22 LAB — PROTIME-INR
INR: 1.1 (ref <1.50)
Prothrombin Time: 14.2 seconds (ref 11.6–15.2)

## 2014-03-22 LAB — IRON: IRON: 37 ug/dL — AB (ref 42–145)

## 2014-03-22 LAB — HEPATITIS A ANTIBODY, TOTAL: Hep A Total Ab: REACTIVE — AB

## 2014-03-22 LAB — HEPATITIS B CORE ANTIBODY, TOTAL: Hep B Core Total Ab: REACTIVE — AB

## 2014-03-22 LAB — HEPATITIS B SURFACE ANTIBODY,QUALITATIVE: HEP B S AB: NEGATIVE

## 2014-03-22 LAB — HEPATITIS B SURFACE ANTIGEN: Hepatitis B Surface Ag: NEGATIVE

## 2014-03-22 LAB — HIV ANTIBODY (ROUTINE TESTING W REFLEX): HIV: NONREACTIVE

## 2014-03-27 LAB — HEPATITIS C GENOTYPE

## 2014-03-29 ENCOUNTER — Other Ambulatory Visit: Payer: Self-pay | Admitting: *Deleted

## 2014-03-29 DIAGNOSIS — E119 Type 2 diabetes mellitus without complications: Secondary | ICD-10-CM

## 2014-04-02 ENCOUNTER — Telehealth: Payer: Self-pay | Admitting: Family Medicine

## 2014-04-02 MED ORDER — GLIMEPIRIDE 4 MG PO TABS: ORAL_TABLET | ORAL | Status: DC

## 2014-04-02 NOTE — Telephone Encounter (Signed)
Pt has upcoming appt with Dr. Andria Frames, wants to know if she needs to come for labs first?

## 2014-04-04 NOTE — Telephone Encounter (Signed)
LVM for patient to call us back

## 2014-04-04 NOTE — Telephone Encounter (Signed)
No labs needed. We will get an A1C at the time of the visit.

## 2014-04-05 NOTE — Telephone Encounter (Signed)
Pt called back and was read Dr. Lowella Bandy massage. She understood and was fine with that. Blima Rich

## 2014-04-05 NOTE — Telephone Encounter (Signed)
LVM for patient to call back. ?

## 2014-04-17 ENCOUNTER — Ambulatory Visit (INDEPENDENT_AMBULATORY_CARE_PROVIDER_SITE_OTHER): Payer: Medicare Other | Admitting: Internal Medicine

## 2014-04-17 ENCOUNTER — Encounter: Payer: Self-pay | Admitting: Internal Medicine

## 2014-04-17 VITALS — BP 110/71 | HR 76 | Temp 97.8°F | Wt 172.0 lb

## 2014-04-17 DIAGNOSIS — B182 Chronic viral hepatitis C: Secondary | ICD-10-CM

## 2014-04-17 NOTE — Progress Notes (Signed)
+Becky Fields is a 68 y.o. female who presents for initial evaluation and management of a positive Hepatitis C antibody test.  Patient tested positive 20 years ago when trying to donate blood. Hepatitis C risk factors present are: none. Patient denies history of blood transfusion, history of clotting factor transfusion, intranasal drug use, IV drug abuse, multiple sexual partners, renal dialysis, sexual contact with person with liver disease, tattoos. Patient has had other studies performed. Results: hepatitis C RNA by PCR, result: positive. Patient has not had prior treatment for Hepatitis C. Patient does not have a past history of liver disease. Patient does not have a family history of liver disease.   HPI: Diagnosed 20 years ago.  Had a biopsy about 10 years ago and told some mild fibrosis.  No record available.  Does not drink alcohol, no past or present drug use.   Does not take Milk Thistle much, occasional antiacid.   Patient does have documented immunity to Hepatitis A. Patient does have documented immunity to Hepatitis B.     Review of Systems A comprehensive review of systems was negative.   Past Medical History  Diagnosis Date  . Diabetes mellitus   . Hypertension   . Cataract   . Hepatitis C   . Hypertriglyceridemia   . Takotsubo syndrome     October 16, 2012  . Ejection fraction     .    Prior to Admission medications   Medication Sig Start Date End Date Taking? Authorizing Provider  aspirin 81 MG tablet Take 81 mg by mouth daily.     Yes Historical Provider, MD  atorvastatin (LIPITOR) 40 MG tablet Take 1 tablet (40 mg total) by mouth daily. 03/01/14  Yes Zigmund Gottron, MD  fluticasone (FLONASE) 50 MCG/ACT nasal spray Place 2 sprays into both nostrils daily. 11/06/13  Yes Zigmund Gottron, MD  glimepiride (AMARYL) 4 MG tablet TAKE TWO TABLETS BY MOUTH EVERY DAY BEFORE  BREAKFAST 04/02/14  Yes Zigmund Gottron, MD  hydrochlorothiazide (HYDRODIURIL) 25 MG  tablet TAKE ONE TABLET BY MOUTH EVERY DAY 02/01/14  Yes Zigmund Gottron, MD  loratadine (CLARITIN) 10 MG tablet TAKE ONE TABLET BY MOUTH DAILY AS NEEDED FOR  ALLERGY 02/01/14  Yes Zigmund Gottron, MD  losartan (COZAAR) 100 MG tablet Take 1 tablet (100 mg total) by mouth daily. 03/16/14  Yes Lupita Dawn, MD  metFORMIN (GLUCOPHAGE) 1000 MG tablet TAKE ONE TABLET BY MOUTH TWICE DAILY 09/25/13  Yes Zigmund Gottron, MD  metoprolol (LOPRESSOR) 100 MG tablet Take 1 tablet (100 mg total) by mouth 2 (two) times daily. 02/23/14  Yes Zigmund Gottron, MD  MILK THISTLE PO Take 1 tablet by mouth daily.   Yes Historical Provider, MD  Multiple Vitamin (MULTIVITAMIN WITH MINERALS) TABS Take 1 tablet by mouth daily.   Yes Historical Provider, MD  nitroGLYCERIN (NITROSTAT) 0.4 MG SL tablet Place 1 tablet (0.4 mg total) under the tongue every 5 (five) minutes x 3 doses as needed for chest pain. 12/25/13  Yes Zigmund Gottron, MD  Polyethyl Glycol-Propyl Glycol (SYSTANE OP) Apply 1 drop to eye 2 (two) times daily as needed (dry eyes).   Yes Historical Provider, MD  sitaGLIPtin (JANUVIA) 100 MG tablet Take 1 tablet (100 mg total) by mouth daily. 12/15/13  Yes Zigmund Gottron, MD  zolpidem (AMBIEN) 10 MG tablet TAKE ONE TABLET BY MOUTH AT BEDTIME AS NEEDED FOR SLEEP 11/21/13   Zigmund Gottron, MD  zoster vaccine live,  PF, (ZOSTAVAX) 16606 UNT/0.65ML injection Inject 19,400 Units into the skin once. 02/21/14   Zigmund Gottron, MD    Allergies  Allergen Reactions  . Lisinopril     REACTION: causes cough  . Sulfa Antibiotics     Told as a child allergic    History  Substance Use Topics  . Smoking status: Never Smoker   . Smokeless tobacco: Never Used  . Alcohol Use: 3.0 oz/week    6 drink(s) per week     Comment: Occasional glass of wine on the weekends    Family History  Problem Relation Age of Onset  . Aneurysm Father   . CAD Brother 45   No liver cancer   Objective:    Filed Vitals:   04/17/14 1356  BP: 110/71  Pulse: 76  Temp: 97.8 F (36.6 C)   in no apparent distress and alert HEENT: anicteric Cor RRR and No murmurs clear Bowel sounds are normal, liver is not enlarged, spleen is not enlarged peripheral pulses normal, no pedal edema, no clubbing or cyanosis negative for - jaundice, spider hemangioma, telangiectasia, palmar erythema, ecchymosis and atrophy  Laboratory Genotype:  Lab Results  Component Value Date   HCVGENOTYPE 1b 03/21/2014   HCV viral load:  Lab Results  Component Value Date   HCVQUANT 3016010* 03/21/2014   Lab Results  Component Value Date   WBC 6.0 03/21/2014   HGB 10.4* 03/21/2014   HCT 33.2* 03/21/2014   MCV 76.1* 03/21/2014   PLT 201 03/21/2014    Lab Results  Component Value Date   CREATININE 0.94 03/21/2014   BUN 27* 03/21/2014   NA 137 03/21/2014   K 4.8 03/21/2014   CL 104 03/21/2014   CO2 23 03/21/2014    Lab Results  Component Value Date   ALT 67* 03/21/2014   AST 69* 03/21/2014   ALKPHOS 63 03/21/2014   BILITOT 0.5 03/21/2014   INR 1.10 03/21/2014      Assessment: Hepatitis C genotype 1b  Plan: 1) Patient counseled extensively on limiting acetaminophen to no more than 2 grams daily, avoidance of alcohol. 2) Transmission discussed with patient including sexual transmission, sharing razors and toothbrush.   3) Will need referral to gastroenterology if concern for cirrhosis on elastography.  4) Will need referral for substance abuse counseling: No. 5) Will prescribe Harvoni for 12 weeks once work up complete through [employer].  6) Hepatitis A vaccine No. 7) Hepatitis B vaccine No. 8) Pneumovax vaccine if concern for cirrhosis 9) will follow up after elastography for results.

## 2014-04-18 ENCOUNTER — Encounter: Payer: Self-pay | Admitting: Family Medicine

## 2014-04-18 ENCOUNTER — Ambulatory Visit (INDEPENDENT_AMBULATORY_CARE_PROVIDER_SITE_OTHER): Payer: Medicare Other | Admitting: Family Medicine

## 2014-04-18 VITALS — BP 115/49 | HR 72 | Temp 98.0°F | Ht 62.0 in | Wt 171.6 lb

## 2014-04-18 DIAGNOSIS — L97511 Non-pressure chronic ulcer of other part of right foot limited to breakdown of skin: Secondary | ICD-10-CM | POA: Diagnosis not present

## 2014-04-18 NOTE — Assessment & Plan Note (Signed)
Trimmed and instructed on local care.  See me in 1-2 weeks to recheck.  I want to insure healing in this diabetic.

## 2014-04-18 NOTE — Patient Instructions (Signed)
I am glad you came in for this.  Please don't let it get so large in the future.   Keep neosporin, a bandade and a foam donut on top of it.   See me in 1-2 weeks to recheck Call me immediately if the redness extends beyond the toe.

## 2014-04-18 NOTE — Progress Notes (Signed)
   Subjective:    Patient ID: Becky Fields, female    DOB: [birth year], 68 y.o.   MRN: [id number]  HPI C/O callous/sore of right great toe Now deceased husband used to file down with a drummel.       Review of Systems     Objective:   Physical Exam  Large callous medial/plantar aspect of Right great toe at IP joint.  Has plant ulcer where central callous avulsed.  Trimmed considerably.  Ulcer is 1cm x 1cm Not infected.      Assessment & Plan:

## 2014-05-09 ENCOUNTER — Ambulatory Visit: Payer: Medicare Other | Admitting: Cardiology

## 2014-05-10 ENCOUNTER — Ambulatory Visit (HOSPITAL_COMMUNITY)
Admission: RE | Admit: 2014-05-10 | Discharge: 2014-05-10 | Disposition: A | Discharge status: Home / Self Care | Payer: Medicare Other | Source: Ambulatory Visit | Attending: Internal Medicine | Admitting: Internal Medicine

## 2014-05-10 DIAGNOSIS — B182 Chronic viral hepatitis C: Secondary | ICD-10-CM | POA: Diagnosis not present

## 2014-05-10 DIAGNOSIS — K7460 Unspecified cirrhosis of liver: Secondary | ICD-10-CM | POA: Insufficient documentation

## 2014-05-11 ENCOUNTER — Other Ambulatory Visit: Payer: Self-pay | Admitting: Internal Medicine

## 2014-05-11 MED ORDER — LEDIPASVIR-SOFOSBUVIR 90-400 MG PO TABS: 1.0000 | ORAL_TABLET | Freq: Every day | ORAL | Status: DC

## 2014-05-16 ENCOUNTER — Encounter: Payer: Self-pay | Admitting: Cardiology

## 2014-05-16 ENCOUNTER — Ambulatory Visit (INDEPENDENT_AMBULATORY_CARE_PROVIDER_SITE_OTHER): Payer: Medicare Other | Admitting: Cardiology

## 2014-05-16 VITALS — BP 111/75 | HR 73 | Ht 62.0 in | Wt 172.0 lb

## 2014-05-16 DIAGNOSIS — R0989 Other specified symptoms and signs involving the circulatory and respiratory systems: Secondary | ICD-10-CM | POA: Diagnosis not present

## 2014-05-16 DIAGNOSIS — R9430 Abnormal result of cardiovascular function study, unspecified: Secondary | ICD-10-CM

## 2014-05-16 DIAGNOSIS — I10 Essential (primary) hypertension: Secondary | ICD-10-CM | POA: Diagnosis not present

## 2014-05-16 DIAGNOSIS — I5181 Takotsubo syndrome: Secondary | ICD-10-CM | POA: Diagnosis not present

## 2014-05-16 DIAGNOSIS — I340 Nonrheumatic mitral (valve) insufficiency: Secondary | ICD-10-CM

## 2014-05-16 NOTE — Assessment & Plan Note (Signed)
I have mentioned above that the patient had very vigorous motion at the base with her Takotsubo event. Eventually her LV normalized.

## 2014-05-16 NOTE — Assessment & Plan Note (Signed)
Her blood pressure is well controlled. She now has infrequent episodes of feeling slight dizziness when standing. She will talk with her primary physician about whether there should be any decreased in her blood pressure meds.

## 2014-05-16 NOTE — Progress Notes (Signed)
Patient ID: Becky Fields, female   DOB: 12-28-1945, 68 y.o.   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 Presented with an unusual sensation and some palpitations. She did not have any significant chest pain. Her physiology was interesting. She had very vigorous motion at the base with ballooning of the apex of the left ventricle. With this she had some left ventricular outflow tract obstruction and mitral regurgitation. Follow-up echo eventually showed that her wall motion abnormalities improved. She had only slight residual mitral regurgitation. She has been fully active and not having any significant problems. Her blood pressure has required multiple medications over time. It is well controlled now. She does have some mild dizziness at times when standing. Over time we may need to lower her blood pressure medicines.  Allergies  Allergen Reactions  . Lisinopril     REACTION: causes cough  . Sulfa Antibiotics     Told as a child allergic    Current Outpatient Prescriptions  Medication Sig Dispense Refill  . aspirin 81 MG tablet Take 81 mg by mouth daily.      Marland Kitchen atorvastatin (LIPITOR) 40 MG tablet Take 1 tablet (40 mg total) by mouth daily. 90 tablet 3  . fluticasone (FLONASE) 50 MCG/ACT nasal spray Place 2 sprays into both nostrils daily. 16 g 12  . glimepiride (AMARYL) 4 MG tablet TAKE TWO TABLETS BY MOUTH EVERY DAY BEFORE  BREAKFAST 180 tablet 3  . hydrochlorothiazide (HYDRODIURIL) 25 MG tablet TAKE ONE TABLET BY MOUTH EVERY DAY 90 tablet 3  . Ledipasvir-Sofosbuvir (HARVONI) 90-400 MG TABS Take 1 tablet by mouth daily. 28 tablet 2  . loratadine (CLARITIN) 10 MG tablet TAKE ONE TABLET BY MOUTH DAILY AS NEEDED FOR  ALLERGY 90 tablet 3  . losartan (COZAAR) 100 MG tablet Take 1 tablet (100 mg total) by mouth daily. 90 tablet 1  . metFORMIN (GLUCOPHAGE) 1000 MG tablet TAKE ONE TABLET BY MOUTH TWICE DAILY 180 tablet 3  .  metoprolol (LOPRESSOR) 100 MG tablet Take 1 tablet (100 mg total) by mouth 2 (two) times daily. 180 tablet 3  . MILK THISTLE PO Take 1 tablet by mouth daily.    . Multiple Vitamin (MULTIVITAMIN WITH MINERALS) TABS Take 1 tablet by mouth daily.    . nitroGLYCERIN (NITROSTAT) 0.4 MG SL tablet Place 1 tablet (0.4 mg total) under the tongue every 5 (five) minutes x 3 doses as needed for chest pain. 25 tablet 3  . Polyethyl Glycol-Propyl Glycol (SYSTANE OP) Apply 1 drop to eye 2 (two) times daily as needed (dry eyes).    . sitaGLIPtin (JANUVIA) 100 MG tablet Take 1 tablet (100 mg total) by mouth daily. 30 tablet 6  . zolpidem (AMBIEN) 10 MG tablet TAKE ONE TABLET BY MOUTH AT BEDTIME AS NEEDED FOR SLEEP 30 tablet 5  . zoster vaccine live, PF, (ZOSTAVAX) 30092 UNT/0.65ML injection Inject 19,400 Units into the skin once. 1 each 0   No current facility-administered medications for this visit.    History   Social History  . Marital Status: Widowed    Spouse Name: Ron    Number of Children: 2  . Years of Education: Bachelors   Occupational History  . office assistant    Social History Main Topics  . Smoking status: Never Smoker   . Smokeless tobacco: Never Used  . Alcohol Use: 3.0 oz/week    6 drink(s) per week  Comment: Occasional glass of wine on the weekends  . Drug Use: No  . Sexual Activity: Not Currently   Other Topics Concern  . Not on file   Social History Narrative   Health Care POA:    Emergency Contact: husband, Brecklyn Galvis (c) [phone]   End of Life Plan:    Who lives with you: husband, Ron, occasionally son who works out of town   Any pets: 2 dogs, 2 cats   Diet: Pt has a varied diet of protein, starch, vegetables.  Pt is not currently on any kind of diet modifications for DM control.   Exercise: Pt has not regular exercise routine.   Seatbelts: Pt reports wearing seatbelt when in vehicles.    Sun Exposure/Protection: Pt reports not wearing sun protection.   Hobbies:  Spending time with granddaughter, reading          Family History  Problem Relation Age of Onset  . Aneurysm Father   . CAD Brother 71  . Heart attack Neg Hx   . Stroke Neg Hx   . Hypertension Mother   . Hypertension Sister     Past Medical History  Diagnosis Date  . Diabetes mellitus   . Hypertension   . Cataract   . Hepatitis C   . Hypertriglyceridemia   . Takotsubo syndrome     October 16, 2012  . Ejection fraction     .    Past Surgical History  Procedure Laterality Date  . Cholecystectomy    . Eye surgery  02/2012    bilateral cataract surg    Patient Active Problem List   Diagnosis Date Noted  . Skin ulcer of right great toe, limited to breakdown of skin 04/18/2014  . Ejection fraction   . Mitral regurgitation 10/18/2012  . Takotsubo syndrome 10/18/2012  . HYPERTRIGLYCERIDEMIA 01/25/2009  . INSOMNIA, PERSISTENT 01/25/2009  . DIABETIC PERIPHERAL NEUROPATHY 04/13/2008  . OBESITY 02/21/2008  . COLONIC POLYPS, HX OF 05/02/2007  . Chronic hepatitis C without hepatic coma 03/07/2007  . Diabetes mellitus type II, uncontrolled 01/28/2007  . HYPERTENSION, BENIGN ESSENTIAL 01/28/2007     Review of systems: Patient denies fever, chills, headache, sweats, rash, change in vision, change in hearing, chest pain, off, nausea or vomiting, urinary symptoms. All other systems are reviewed and are negative.  PHYSICAL EXAM Patient is overweight but stable. Head is atraumatic. Sclera and conjunctiva are normal. There is no jugulovenous distention. Lungs are clear. Respiratory effort is nonlabored. Cardiac exam reveals an S1 and S2. Abdomen is soft. There is no peripheral edema. There are no musculoskeletal deformities. There are no skin rashes.  Filed Vitals:   05/16/14 1441  BP: 111/75  Pulse: 73  Height: 5\' 2"  (1.575 m)  Weight: 172 lb (78.019 kg)   EKG is done today and reviewed by me. There is normal sinus rhythm. There is decreased anterior R-wave progression. There  is no change from the past.  ASSESSMENT & PLAN

## 2014-05-16 NOTE — Assessment & Plan Note (Signed)
Ejection fraction in July, 2014 improved to 55-65%. Wall motion completely normalized.

## 2014-05-16 NOTE — Patient Instructions (Signed)
Your physician recommends that you continue on your current medications as directed. Please refer to the Current Medication list given to you today.    Your physician wants you to follow-up in: [place] will receive a reminder letter in the mail two months in advance. If you don't receive a letter, please call our office to schedule the follow-up appointment.

## 2014-05-16 NOTE — Assessment & Plan Note (Signed)
Initially she had significant mitral regurgitation with acute Takot-subo. After her LV improved she had residual mild mitral regurgitation. Consideration can be given to a follow-up echo in another year.

## 2014-05-23 ENCOUNTER — Ambulatory Visit (INDEPENDENT_AMBULATORY_CARE_PROVIDER_SITE_OTHER): Payer: Medicare Other | Admitting: Internal Medicine

## 2014-05-23 ENCOUNTER — Encounter: Payer: Self-pay | Admitting: Internal Medicine

## 2014-05-23 VITALS — BP 135/85 | HR 73 | Temp 98.1°F | Ht 62.0 in | Wt 172.0 lb

## 2014-05-23 DIAGNOSIS — B182 Chronic viral hepatitis C: Secondary | ICD-10-CM

## 2014-05-23 DIAGNOSIS — K7460 Unspecified cirrhosis of liver: Secondary | ICD-10-CM

## 2014-05-23 NOTE — Assessment & Plan Note (Addendum)
Going to start treatment with Harvoni for 12 weeks.  I discussed drug interactions with ppi/acid medicine and potential interaction with statin (of increasing statin).  She is going to discuss with her primary provider if statin should be reduced or held or if needed.   Labs in 2 and 4 weeks, fu with me in 5 weeks.  Will schedule with [facility] pharmacist if she has further questions prior to that.  Will check CK in case she continues on statin.   25 minutes spent including 15 min conseling on drug interactions and next steps.

## 2014-05-23 NOTE — Progress Notes (Signed)
   Subjective:    Patient ID: Becky Fields, female    DOB: 1945/10/09, 68 y.o.   MRN: [id number]  HPI Here for follow up of HCV.  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  Has been approved for treatment and has medication at home.  Has not started yet.  Asks about interacitons and result of elastography.  Pleased with getting treatment.     Review of Systems  Constitutional: Negative for fever and fatigue.  Gastrointestinal: Negative for nausea and diarrhea.  Skin: Negative for rash.  Neurological: Negative for dizziness and headaches.       Objective:   Physical Exam  Constitutional: She appears well-developed and well-nourished. No distress.  Eyes: No scleral icterus.  Cardiovascular: Normal rate, regular rhythm and normal heart sounds.   No murmur heard. Pulmonary/Chest: Breath sounds normal. No respiratory distress.  Lymphadenopathy:    She has no cervical adenopathy.  Skin: No rash noted.          Assessment & Plan:

## 2014-05-23 NOTE — Assessment & Plan Note (Signed)
F3/F4 on elastography.  Will need [place] screening every 6 months at [facility] or by her PCP.   I also will discuss with her next visit that she would benefit from GI evaluation for potential varices screening.

## 2014-05-23 NOTE — Patient Instructions (Addendum)
Date 05/23/14  Dear Ms Trenton Gammon, As discussed in the [facility], your hepatitis C therapy will include the following medications:          Harvoni 90mg /400mg  tablet:           Take 1 tablet by mouth once daily   Please note that ALL MEDICATIONS WILL START ON THE SAME DATE for a total of 12 weeks. ---------------------------------------------------------------- Your HCV Treatment Start Date: TBA   Your HCV genotype:  1b    Liver Fibrosis: F3/F4  ---------------------------------------------------------------- YOUR PHARMACY CONTACT:   [place] [facility] Phone: [phone] Hours: Monday to Friday 7:30 am to 6:00 pm   Please always contact your pharmacy at least 3-4 business days before you run out of medications to ensure your next month's medication is ready or 1 week prior to running out if you receive it by mail.  Remember, each prescription is for 28 days. ---------------------------------------------------------------- GENERAL NOTES REGARDING YOUR HEPATITIS C MEDICATION:  SOFOSBUVIR/LEDIPASVIR (HARVONI): - Harvoni tablet is taken daily with OR without food. - The tablets are orange. - The tablets should be stored at room temperature.  - Acid reducing agents such as H2 blockers (ie. Pepcid (famotidine), Zantac (ranitidine), Tagamet (cimetidine), Axid (nizatidine) and proton pump inhibitors (ie. Prilosec (omeprazole), Protonix (pantoprazole), Nexium (esomeprazole), or Aciphex (rabeprazole)) can decrease effectiveness of Harvoni. Do not take until you have discussed with a health care provider.    -Antacids that contain magnesium and/or aluminum hydroxide (ie. Milk of [place], Rolaids, Gaviscon, Maalox, Mylanta, an dArthritis Pain Formula)can reduce absorption of Harvoni, so take them at least 4 hours before or after Harvoni.  -Calcium carbonate (calcium supplements or antacids such as Tums, Caltrate,  Os-Cal)needs to be taken at least 4 hours hours before or after Harvoni.  -St. John's wort or any products that contain St. John's wort like some herbal supplements  Please inform the office prior to starting any of these medications.  - The common side effects with Harvoni:      1. Fatigue      2. Headache      3. Nausea      4. Diarrhea      5. Insomnia   Support Path is a suite of resources designed to help patients start with HARVONI and move toward treatment completion [place] helps patients access therapy and get off to an efficient start  Benefits investigation and prior authorization support Co-pay and other financial assistance A specialty pharmacy finder CO-PAY COUPON The [place] co-pay coupon may help eligible patients lower their out-of-pocket costs. With a co-pay coupon, most eligible patients may pay no more than $5 per co-pay (restrictions apply) www.harvoni.com call 252-773-0873 Not valid for patients enrolled in government healthcare prescription drug programs, such as Medicare Part D and Medicaid. Patients in the coverage gap known as the "donut hole" also are not eligible The HARVONI co-pay coupon program will cover the out-of-pocket costs for HARVONI prescriptions up to a maximum of 25% of the catalog price of a 12-week regimen of HARVONI  Please note that this only lists the most common side effects and is NOT a comprehensive list of the potential side effects of these medications. For more information, please review the drug information sheets that come with your medication package from the pharmacy.  ---------------------------------------------------------------- GENERAL HELPFUL HINTS ON HCV THERAPY: 1. No alcohol. 2. Protect against sun-sensitivity/sunburns (wear sunglasses, hat, long sleeves, pants and sunscreen).  3. Stay well-hydrated/well-moisturized. 4. Notify the [facility] of any changes in your other over-the-counter/herbal or  prescription medications. 5. If you miss a dose of your medication, take the missed dose as soon as you remember. Return to your regular time/dose schedule the next day.  6.  Do not stop taking your medications without first talking with your healthcare provider. 7.  You may take Tylenol (acetaminophen), as long as the dose is less than 2000 mg (OR no more than 4 tablets of the Tylenol Extra Strengths 500mg  tablet) in 24 hours. 8.  You will need to obtain routine labs and/or office visits at [facility] at weeks 2, 4, 8,  and 12 as well as 12 and 24 weeks after completion of treatment.   Scharlene Gloss, [place] for [place], [place]  [phone]

## 2014-05-30 ENCOUNTER — Ambulatory Visit (INDEPENDENT_AMBULATORY_CARE_PROVIDER_SITE_OTHER): Payer: Medicare Other | Admitting: Family Medicine

## 2014-05-30 ENCOUNTER — Ambulatory Visit
Admission: RE | Admit: 2014-05-30 | Discharge: 2014-05-30 | Disposition: A | Discharge status: Home / Self Care | Payer: Medicare Other | Source: Ambulatory Visit | Attending: Family Medicine | Admitting: Family Medicine

## 2014-05-30 ENCOUNTER — Encounter: Payer: Self-pay | Admitting: Family Medicine

## 2014-05-30 DIAGNOSIS — L97511 Non-pressure chronic ulcer of other part of right foot limited to breakdown of skin: Secondary | ICD-10-CM

## 2014-05-30 DIAGNOSIS — L97519 Non-pressure chronic ulcer of other part of right foot with unspecified severity: Secondary | ICD-10-CM | POA: Diagnosis not present

## 2014-05-30 MED ORDER — CEPHALEXIN 500 MG PO CAPS: 500.0000 mg | ORAL_CAPSULE | Freq: Four times a day (QID) | ORAL | Status: DC

## 2014-05-30 NOTE — Patient Instructions (Signed)
See me in two weeks - or I can also set you up with our wound center.  Either is fine with me. I will call with X ray results. Continue the local care of the toe and start the antibiotics that I sent to [facility].

## 2014-05-31 ENCOUNTER — Encounter (HOSPITAL_COMMUNITY): Payer: Self-pay | Admitting: Cardiovascular Disease

## 2014-05-31 NOTE — Progress Notes (Signed)
   Subjective:    Patient ID: Becky Fields, female    DOB: 1946/06/09, 68 y.o.   MRN: [id number]  HPI  Great news: she is getting treated for her Hep C FU Right great to ulcer. Unfortunately one month has past and much of the callous has reformed.  She has been treating topically with neosporin.  Within the last week, more swelling and redness.  Denies discharge, fever or chills Good reports from both cards and from ID   Review of Systems     Objective:   Physical Exam Significant callous, again trimmed.  Ulcer looks clean and is similar in size <1cm in diameter.  Does have some swelling and redness of toe.  Redness does not extend past MTP joint.        Assessment & Plan:

## 2014-05-31 NOTE — Assessment & Plan Note (Signed)
Now with early infection.  No significant risk for MRSA, so will start with Keflex.  Xray checked (neg) so plan two week therapy.  See by me in 2 weeks, sooner prn.

## 2014-06-06 ENCOUNTER — Other Ambulatory Visit: Payer: Medicare Other

## 2014-06-06 DIAGNOSIS — B182 Chronic viral hepatitis C: Secondary | ICD-10-CM | POA: Diagnosis not present

## 2014-06-06 LAB — COMPLETE METABOLIC PANEL WITH GFR
ALT: 18 U/L (ref 0–35)
AST: 25 U/L (ref 0–37)
Albumin: 3.6 g/dL (ref 3.5–5.2)
Alkaline Phosphatase: 67 U/L (ref 39–117)
BILIRUBIN TOTAL: 0.4 mg/dL (ref 0.2–1.2)
BUN: 31 mg/dL — ABNORMAL HIGH (ref 6–23)
CO2: 25 mEq/L (ref 19–32)
CREATININE: 0.88 mg/dL (ref 0.50–1.10)
Calcium: 9.1 mg/dL (ref 8.4–10.5)
Chloride: 102 mEq/L (ref 96–112)
GFR, EST NON AFRICAN AMERICAN: 68 mL/min
GFR, Est African American: 79 mL/min
GLUCOSE: 138 mg/dL — AB (ref 70–99)
Potassium: 4.1 mEq/L (ref 3.5–5.3)
Sodium: 137 mEq/L (ref 135–145)
Total Protein: 7.3 g/dL (ref 6.0–8.3)

## 2014-06-06 LAB — CBC WITH DIFFERENTIAL/PLATELET
Basophils Absolute: 0 10*3/uL (ref 0.0–0.1)
Basophils Relative: 0 % (ref 0–1)
EOS ABS: 0.2 10*3/uL (ref 0.0–0.7)
EOS PCT: 3 % (ref 0–5)
HEMATOCRIT: 33.6 % — AB (ref 36.0–46.0)
HEMOGLOBIN: 10.6 g/dL — AB (ref 12.0–15.0)
LYMPHS ABS: 2 10*3/uL (ref 0.7–4.0)
Lymphocytes Relative: 34 % (ref 12–46)
MCH: 24.3 pg — AB (ref 26.0–34.0)
MCHC: 31.5 g/dL (ref 30.0–36.0)
MCV: 77.1 fL — ABNORMAL LOW (ref 78.0–100.0)
MONOS PCT: 15 % — AB (ref 3–12)
MPV: 9.2 fL — ABNORMAL LOW (ref 9.4–12.4)
Monocytes Absolute: 0.9 10*3/uL (ref 0.1–1.0)
Neutro Abs: 2.9 10*3/uL (ref 1.7–7.7)
Neutrophils Relative %: 48 % (ref 43–77)
Platelets: 172 10*3/uL (ref 150–400)
RBC: 4.36 MIL/uL (ref 3.87–5.11)
RDW: 16.7 % — AB (ref 11.5–15.5)
WBC: 6 10*3/uL (ref 4.0–10.5)

## 2014-06-06 LAB — CK: Total CK: 31 U/L (ref 7–177)

## 2014-06-07 LAB — HEPATITIS C RNA QUANTITATIVE: HCV Quantitative: NOT DETECTED IU/mL (ref <15)

## 2014-06-08 ENCOUNTER — Encounter: Payer: Self-pay | Admitting: Family Medicine

## 2014-06-08 ENCOUNTER — Ambulatory Visit (INDEPENDENT_AMBULATORY_CARE_PROVIDER_SITE_OTHER): Payer: Medicare Other | Admitting: Family Medicine

## 2014-06-08 VITALS — BP 114/75 | HR 73 | Ht 62.0 in

## 2014-06-08 DIAGNOSIS — IMO0002 Reserved for concepts with insufficient information to code with codable children: Secondary | ICD-10-CM

## 2014-06-08 DIAGNOSIS — E1165 Type 2 diabetes mellitus with hyperglycemia: Secondary | ICD-10-CM | POA: Diagnosis not present

## 2014-06-08 DIAGNOSIS — L97511 Non-pressure chronic ulcer of other part of right foot limited to breakdown of skin: Secondary | ICD-10-CM

## 2014-06-08 LAB — POCT GLYCOSYLATED HEMOGLOBIN (HGB A1C): HEMOGLOBIN A1C: 6.5

## 2014-06-08 NOTE — Progress Notes (Signed)
   Subjective:    Patient ID: Becky Fields, female    DOB: [birth year], 68 y.o.   MRN: [id number]  HPI Recheck foot ulcer.  Much improved to her Due for A1C   Review of Systems     Objective:   Physical Exam  Great toe ulcer clean and good granulation tissue.  Now the size of the head of a pen.  Callous surrounding ulcer trimmed.        Assessment & Plan:

## 2014-06-08 NOTE — Patient Instructions (Signed)
See me in 3 weeks for recheck.  Sooner if worsens. Great job with your diabetes.

## 2014-06-08 NOTE — Assessment & Plan Note (Signed)
Improved.  DC antibiotics.  Continue local care.

## 2014-06-20 ENCOUNTER — Other Ambulatory Visit: Payer: Self-pay | Admitting: *Deleted

## 2014-06-20 ENCOUNTER — Other Ambulatory Visit: Payer: Medicare Other

## 2014-06-20 DIAGNOSIS — E1165 Type 2 diabetes mellitus with hyperglycemia: Secondary | ICD-10-CM

## 2014-06-20 DIAGNOSIS — B182 Chronic viral hepatitis C: Secondary | ICD-10-CM

## 2014-06-20 DIAGNOSIS — IMO0002 Reserved for concepts with insufficient information to code with codable children: Secondary | ICD-10-CM

## 2014-06-20 LAB — CBC WITH DIFFERENTIAL/PLATELET
BASOS ABS: 0 10*3/uL (ref 0.0–0.1)
BASOS PCT: 0 % (ref 0–1)
EOS PCT: 5 % (ref 0–5)
Eosinophils Absolute: 0.3 10*3/uL (ref 0.0–0.7)
HCT: 31.4 % — ABNORMAL LOW (ref 36.0–46.0)
Hemoglobin: 9.8 g/dL — ABNORMAL LOW (ref 12.0–15.0)
Lymphocytes Relative: 36 % (ref 12–46)
Lymphs Abs: 1.9 10*3/uL (ref 0.7–4.0)
MCH: 24 pg — AB (ref 26.0–34.0)
MCHC: 31.2 g/dL (ref 30.0–36.0)
MCV: 76.8 fL — AB (ref 78.0–100.0)
MONO ABS: 0.8 10*3/uL (ref 0.1–1.0)
MONOS PCT: 16 % — AB (ref 3–12)
MPV: 9.1 fL (ref 8.6–12.4)
NEUTROS ABS: 2.3 10*3/uL (ref 1.7–7.7)
Neutrophils Relative %: 43 % (ref 43–77)
Platelets: 166 10*3/uL (ref 150–400)
RBC: 4.09 MIL/uL (ref 3.87–5.11)
RDW: 16.5 % — AB (ref 11.5–15.5)
WBC: 5.3 10*3/uL (ref 4.0–10.5)

## 2014-06-20 LAB — COMPLETE METABOLIC PANEL WITH GFR
ALK PHOS: 70 U/L (ref 39–117)
ALT: 17 U/L (ref 0–35)
AST: 20 U/L (ref 0–37)
Albumin: 3.7 g/dL (ref 3.5–5.2)
BILIRUBIN TOTAL: 0.3 mg/dL (ref 0.2–1.2)
BUN: 35 mg/dL — ABNORMAL HIGH (ref 6–23)
CO2: 25 mEq/L (ref 19–32)
Calcium: 9 mg/dL (ref 8.4–10.5)
Chloride: 103 mEq/L (ref 96–112)
Creat: 0.95 mg/dL (ref 0.50–1.10)
GFR, EST NON AFRICAN AMERICAN: 62 mL/min
GFR, Est African American: 72 mL/min
Glucose, Bld: 129 mg/dL — ABNORMAL HIGH (ref 70–99)
Potassium: 4.2 mEq/L (ref 3.5–5.3)
SODIUM: 138 meq/L (ref 135–145)
TOTAL PROTEIN: 7.3 g/dL (ref 6.0–8.3)

## 2014-06-20 LAB — CK: CK TOTAL: 59 U/L (ref 7–177)

## 2014-06-20 MED ORDER — SITAGLIPTIN PHOSPHATE 100 MG PO TABS: 100.0000 mg | ORAL_TABLET | Freq: Every day | ORAL | Status: DC

## 2014-06-23 LAB — HEPATITIS C RNA QUANTITATIVE: HCV Quantitative: NOT DETECTED IU/mL (ref <15)

## 2014-06-28 ENCOUNTER — Encounter: Payer: Self-pay | Admitting: Internal Medicine

## 2014-06-28 ENCOUNTER — Ambulatory Visit (INDEPENDENT_AMBULATORY_CARE_PROVIDER_SITE_OTHER): Payer: Medicare Other | Admitting: Internal Medicine

## 2014-06-28 VITALS — BP 132/79 | HR 81 | Temp 98.3°F | Wt 169.0 lb

## 2014-06-28 DIAGNOSIS — B182 Chronic viral hepatitis C: Secondary | ICD-10-CM | POA: Diagnosis not present

## 2014-06-28 DIAGNOSIS — K7460 Unspecified cirrhosis of liver: Secondary | ICD-10-CM | POA: Diagnosis not present

## 2014-06-28 NOTE — Assessment & Plan Note (Addendum)
She is doing well. Will complete 12 weeks and then we'll check viral load at the end and I will see her after that.  She will hold her statin until otherwise instructed by Dr. Andria Frames.

## 2014-06-28 NOTE — Assessment & Plan Note (Addendum)
Concern for cirrhosis on the elastography though lab results do not suggest any advanced disease. She will get [facility] screening every 6 months by liver ultrasound.

## 2014-06-28 NOTE — Progress Notes (Signed)
   Subjective:    Patient ID: Becky Fields, female    DOB: [birth year], 69 y.o.   MRN: [id number]  HPI Here for follow up of HCV.  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   Now has completed over one month of treatment. Early viral response is undetectable.  Also of note, her LFTs have normalized. No new issues.   Review of Systems  Constitutional: Negative for fever and fatigue.  Gastrointestinal: Negative for nausea and diarrhea.  Skin: Negative for rash.  Neurological: Negative for dizziness and headaches.       Objective:   Physical Exam  Constitutional: She appears well-developed and well-nourished. No distress.  Eyes: No scleral icterus.  Cardiovascular: Normal rate, regular rhythm and normal heart sounds.   No murmur heard. Pulmonary/Chest: Breath sounds normal. No respiratory distress.  Lymphadenopathy:    She has no cervical adenopathy.  Skin: No rash noted.          Assessment & Plan:

## 2014-07-11 ENCOUNTER — Encounter: Payer: Self-pay | Admitting: Family Medicine

## 2014-07-11 ENCOUNTER — Ambulatory Visit (INDEPENDENT_AMBULATORY_CARE_PROVIDER_SITE_OTHER): Payer: Medicare Other | Admitting: Family Medicine

## 2014-07-11 VITALS — BP 123/46 | HR 71 | Temp 98.1°F | Ht 62.0 in | Wt 171.2 lb

## 2014-07-11 DIAGNOSIS — L97511 Non-pressure chronic ulcer of other part of right foot limited to breakdown of skin: Secondary | ICD-10-CM | POA: Diagnosis not present

## 2014-07-12 NOTE — Assessment & Plan Note (Signed)
Trimmed again.  Continue local care.  Recheck one month.

## 2014-07-12 NOTE — Progress Notes (Signed)
   Subjective:    Patient ID: Becky Fields, female    DOB: [birth year], 69 y.o.   MRN: [id number]  HPI I have been working on a diabetic foot ulcer with callous on the plantar surface of the right great toe.  Here for recheck.  Doing better.  Less red, less pain.  Callous has recurred since last trimming.    Review of Systems     Objective:   Physical Exam Toe is non red.  Callous trimmed Still small central ulcer which is clean and dry.          Assessment & Plan:

## 2014-07-12 NOTE — Patient Instructions (Signed)
See you in one month, sooner if redness pain or swelling. Continue your local care.

## 2014-07-18 ENCOUNTER — Other Ambulatory Visit: Payer: Medicare Other

## 2014-07-26 ENCOUNTER — Ambulatory Visit: Payer: Medicare Other | Admitting: Internal Medicine

## 2014-08-15 ENCOUNTER — Encounter: Payer: Self-pay | Admitting: Family Medicine

## 2014-08-15 ENCOUNTER — Other Ambulatory Visit: Payer: Medicare Other

## 2014-08-15 ENCOUNTER — Ambulatory Visit (INDEPENDENT_AMBULATORY_CARE_PROVIDER_SITE_OTHER): Payer: Medicare Other | Admitting: Family Medicine

## 2014-08-15 VITALS — BP 130/71 | HR 83 | Temp 98.3°F | Ht 62.0 in | Wt 172.0 lb

## 2014-08-15 DIAGNOSIS — R3589 Other polyuria: Secondary | ICD-10-CM

## 2014-08-15 DIAGNOSIS — L97511 Non-pressure chronic ulcer of other part of right foot limited to breakdown of skin: Secondary | ICD-10-CM | POA: Diagnosis not present

## 2014-08-15 DIAGNOSIS — R300 Dysuria: Secondary | ICD-10-CM | POA: Diagnosis not present

## 2014-08-15 DIAGNOSIS — N3000 Acute cystitis without hematuria: Secondary | ICD-10-CM | POA: Diagnosis not present

## 2014-08-15 DIAGNOSIS — R358 Other polyuria: Secondary | ICD-10-CM

## 2014-08-15 DIAGNOSIS — N3001 Acute cystitis with hematuria: Secondary | ICD-10-CM | POA: Diagnosis not present

## 2014-08-15 DIAGNOSIS — N390 Urinary tract infection, site not specified: Secondary | ICD-10-CM | POA: Insufficient documentation

## 2014-08-15 LAB — POCT UA - MICROSCOPIC ONLY

## 2014-08-15 LAB — POCT URINALYSIS DIPSTICK
Bilirubin, UA: NEGATIVE
Glucose, UA: NEGATIVE
KETONES UA: NEGATIVE
Nitrite, UA: NEGATIVE
PH UA: 6
Protein, UA: NEGATIVE
Spec Grav, UA: 1.02
Urobilinogen, UA: 0.2

## 2014-08-15 MED ORDER — CEPHALEXIN 500 MG PO CAPS: 500.0000 mg | ORAL_CAPSULE | Freq: Two times a day (BID) | ORAL | Status: DC

## 2014-08-15 NOTE — Patient Instructions (Signed)
I sent in your prescription for the urinary tract infection. See me in one month.  I am happy to refer you to Sports med for inserts or to podiatry.  I am also fine with me treating you.  You decide. Have a nice day.

## 2014-08-16 NOTE — Assessment & Plan Note (Signed)
Persistent problem.  I watched her gate and I am unclear why she keeps having ulcer formation.  No obvious unusual pressure.  Offered sports med referral for insert consideration and or podiatry referral.  Given growth rate of callous, I need to see her monthly to keep trimmed.

## 2014-08-16 NOTE — Progress Notes (Signed)
   Subjective:    Patient ID: Becky Fields, female    DOB: Nov 23, 1945, 69 y.o.   MRN: [id number]  HPI FU right great toe callous/ulcer.  Callous has reformed.  She has tried multiple different shoes.  2 day Hx of urgency and frequency.  No fever, nausea vomiting or flank pain.  No vag DC    Review of Systems     Objective:   Physical Exam Callous right great toe.  Trimmed.  Still has ulcer which had calloused over.  I unroofed.  Clean and dry   Diabetic foot exam done  No flank pain.  Minimal suprapubic tenderness.        Assessment & Plan:

## 2014-08-16 NOTE — Assessment & Plan Note (Signed)
Uncomplicated UTI.

## 2014-08-17 ENCOUNTER — Other Ambulatory Visit: Payer: Self-pay | Admitting: Family Medicine

## 2014-08-17 DIAGNOSIS — IMO0002 Reserved for concepts with insufficient information to code with codable children: Secondary | ICD-10-CM

## 2014-08-17 DIAGNOSIS — E1165 Type 2 diabetes mellitus with hyperglycemia: Secondary | ICD-10-CM

## 2014-08-17 NOTE — Assessment & Plan Note (Signed)
Refilled per e request

## 2014-08-22 ENCOUNTER — Ambulatory Visit: Payer: Medicare Other | Admitting: Internal Medicine

## 2014-08-23 ENCOUNTER — Other Ambulatory Visit: Payer: Medicare Other

## 2014-08-23 DIAGNOSIS — B182 Chronic viral hepatitis C: Secondary | ICD-10-CM

## 2014-08-23 NOTE — Progress Notes (Signed)
labs

## 2014-08-24 LAB — HEPATITIS C RNA QUANTITATIVE: HCV QUANT: NOT DETECTED [IU]/mL (ref <15)

## 2014-08-29 ENCOUNTER — Telehealth: Payer: Self-pay | Admitting: *Deleted

## 2014-08-29 ENCOUNTER — Ambulatory Visit (INDEPENDENT_AMBULATORY_CARE_PROVIDER_SITE_OTHER): Payer: Medicare Other | Admitting: Internal Medicine

## 2014-08-29 ENCOUNTER — Encounter: Payer: Self-pay | Admitting: Internal Medicine

## 2014-08-29 VITALS — BP 117/80 | HR 81 | Temp 97.9°F | Wt 172.0 lb

## 2014-08-29 DIAGNOSIS — K7460 Unspecified cirrhosis of liver: Secondary | ICD-10-CM

## 2014-08-29 DIAGNOSIS — B182 Chronic viral hepatitis C: Secondary | ICD-10-CM | POA: Diagnosis not present

## 2014-08-29 NOTE — Telephone Encounter (Signed)
Called patient and left voice mail. Her Ultrasound is scheduled for 11/16/14 at 7:40 AM at [place], [facility]. Nothing to eat or drink after midnight. Requested that patient call back to confirm she received this message. Myrtis Hopping

## 2014-08-29 NOTE — Progress Notes (Signed)
   Subjective:    Patient ID: Becky Fields, female    DOB: [birth year], 69 y.o.   MRN: [id number]  HPI Here for follow up of HCV.  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   Now has completed treatment. End of treatment viral response is undetectable.  No new issues.  Pleased to have finished.  Did hold her statin and hoping not to continue.    Review of Systems  Constitutional: Negative for fever and fatigue.  Gastrointestinal: Negative for nausea and diarrhea.  Skin: Negative for rash.  Neurological: Negative for dizziness and headaches.       Objective:   Physical Exam  Constitutional: She appears well-developed and well-nourished. No distress.  Eyes: No scleral icterus.  Cardiovascular: Normal rate, regular rhythm and normal heart sounds.   No murmur heard. Pulmonary/Chest: Breath sounds normal. No respiratory distress.  Lymphadenopathy:    She has no cervical adenopathy.  Skin: No rash noted.          Assessment & Plan:

## 2014-08-29 NOTE — Assessment & Plan Note (Signed)
She has completed treatment and hopefully will maintain undetectable. We'll check again in 3 months and then 6 months from now and if it remains negative then she would be considered cured.

## 2014-08-29 NOTE — Assessment & Plan Note (Signed)
I will add to her for screening ultrasound for [facility] screening prior to next visit.

## 2014-09-06 ENCOUNTER — Other Ambulatory Visit: Payer: Medicare Other

## 2014-09-12 ENCOUNTER — Encounter: Payer: Self-pay | Admitting: Family Medicine

## 2014-09-12 ENCOUNTER — Ambulatory Visit (INDEPENDENT_AMBULATORY_CARE_PROVIDER_SITE_OTHER): Payer: Medicare Other | Admitting: Family Medicine

## 2014-09-12 VITALS — BP 113/45 | HR 75 | Temp 98.5°F | Ht 62.0 in | Wt 169.9 lb

## 2014-09-12 DIAGNOSIS — L97511 Non-pressure chronic ulcer of other part of right foot limited to breakdown of skin: Secondary | ICD-10-CM

## 2014-09-12 DIAGNOSIS — E1165 Type 2 diabetes mellitus with hyperglycemia: Secondary | ICD-10-CM

## 2014-09-12 DIAGNOSIS — IMO0002 Reserved for concepts with insufficient information to code with codable children: Secondary | ICD-10-CM

## 2014-09-12 MED ORDER — AMOXICILLIN-POT CLAVULANATE 875-125 MG PO TABS: 1.0000 | ORAL_TABLET | Freq: Two times a day (BID) | ORAL | Status: DC

## 2014-09-12 MED ORDER — SITAGLIPTIN PHOSPHATE 100 MG PO TABS: 100.0000 mg | ORAL_TABLET | Freq: Every day | ORAL | Status: DC

## 2014-09-12 NOTE — Patient Instructions (Signed)
See me in 10 days to 2 weeks to relook at both toes.  Sooner if worsening. I sent in an antibiotic that should help both. I will call with the Xray results.

## 2014-09-13 ENCOUNTER — Other Ambulatory Visit: Payer: Self-pay | Admitting: Family Medicine

## 2014-09-13 ENCOUNTER — Ambulatory Visit
Admission: RE | Admit: 2014-09-13 | Discharge: 2014-09-13 | Disposition: A | Discharge status: Home / Self Care | Payer: Medicare Other | Source: Ambulatory Visit | Attending: Family Medicine | Admitting: Family Medicine

## 2014-09-13 DIAGNOSIS — M19071 Primary osteoarthritis, right ankle and foot: Secondary | ICD-10-CM | POA: Diagnosis not present

## 2014-09-13 DIAGNOSIS — L97519 Non-pressure chronic ulcer of other part of right foot with unspecified severity: Secondary | ICD-10-CM | POA: Diagnosis not present

## 2014-09-13 DIAGNOSIS — L97511 Non-pressure chronic ulcer of other part of right foot limited to breakdown of skin: Secondary | ICD-10-CM

## 2014-09-13 NOTE — Progress Notes (Signed)
   Subjective:    Patient ID: Becky Fields, female    DOB: Sep 24, 1945, 69 y.o.   MRN: [id number]  HPI Continue to struggle with callous formation of Rt great toe around ulcer I have been treating.  No fever.  Foot is smelling bad.  In for her monthly wound check and callous trim  For some reason, starting yesterday, her left great toes became red and perhaps a little swollen.  No trauma (although she does have neuropathy so she may have had trauma without knowing).  No systemic fever.  No skin breakdown.  She does have onychomyosis of left great toe    Review of Systems     Objective:   Physical Exam Right great toe.  Not red.  Typical amount of callous.  Still ulcer on plantar surface.  Does have anaerobic smell.  Trimmed callous. Left great toe.  Definitely red.  No entrance wound or ulcer.  Redness stops at MTP joint.  Does have onychomycosis as a possible infection entrance site.  Trimmed back nail       Assessment & Plan:

## 2014-09-13 NOTE — Assessment & Plan Note (Signed)
Right toe, I am worried about anaerobe smell (and chronic non healing ulcer.)  Left great toe looks infected.  Will use augmentin to treat both.  Given warning signs for worsening.  Xray right toe to rule out osteo (last xray was 12/15. Shorten followup interval for more aggressive wound care.

## 2014-10-10 ENCOUNTER — Ambulatory Visit (INDEPENDENT_AMBULATORY_CARE_PROVIDER_SITE_OTHER): Payer: Medicare Other | Admitting: Family Medicine

## 2014-10-10 VITALS — BP 120/55 | HR 139 | Temp 98.1°F | Ht 62.0 in | Wt 171.4 lb

## 2014-10-10 DIAGNOSIS — B3731 Acute candidiasis of vulva and vagina: Secondary | ICD-10-CM

## 2014-10-10 DIAGNOSIS — L97511 Non-pressure chronic ulcer of other part of right foot limited to breakdown of skin: Secondary | ICD-10-CM

## 2014-10-10 DIAGNOSIS — B373 Candidiasis of vulva and vagina: Secondary | ICD-10-CM

## 2014-10-10 DIAGNOSIS — B353 Tinea pedis: Secondary | ICD-10-CM | POA: Diagnosis present

## 2014-10-10 MED ORDER — FLUCONAZOLE 150 MG PO TABS: 150.0000 mg | ORAL_TABLET | Freq: Once | ORAL | Status: DC

## 2014-10-10 MED ORDER — KETOCONAZOLE 2 % EX CREA: 1.0000 "application " | TOPICAL_CREAM | Freq: Two times a day (BID) | CUTANEOUS | Status: DC

## 2014-10-10 NOTE — Patient Instructions (Signed)
Two prescriptions A cream for the cracked skin between your toes. A one time pill for the vaginal infection. Maybe the toe looks better.  It definitely smells better. See me in 2-3 weeks

## 2014-10-11 ENCOUNTER — Encounter: Payer: Self-pay | Admitting: Family Medicine

## 2014-10-11 NOTE — Assessment & Plan Note (Signed)
Likely due to recent antibiotics

## 2014-10-11 NOTE — Progress Notes (Signed)
   Subjective:    Patient ID: Becky Fields, female    DOB: Jan 23, 1946, 69 y.o.   MRN: [id number]  HPI Here to recheck toe.  Callous has regrown.  No pain or fever    Review of Systems     Objective:   Physical Exam Extensive callous of plantar aspect of right great toe.  Debrided.  Still with central ulcer - perhaps a bit more shallow.  No odor.  I was a bit more aggressive in my debridement at this time. Does also have tinea pedis on the right foot.        Assessment & Plan:

## 2014-10-11 NOTE — Assessment & Plan Note (Signed)
Not infected, once again debrided.

## 2014-10-31 ENCOUNTER — Encounter: Payer: Self-pay | Admitting: Family Medicine

## 2014-10-31 ENCOUNTER — Ambulatory Visit (INDEPENDENT_AMBULATORY_CARE_PROVIDER_SITE_OTHER): Payer: Medicare Other | Admitting: Family Medicine

## 2014-10-31 VITALS — BP 120/61 | HR 71 | Temp 98.3°F | Ht 62.0 in | Wt 171.4 lb

## 2014-10-31 DIAGNOSIS — L97511 Non-pressure chronic ulcer of other part of right foot limited to breakdown of skin: Secondary | ICD-10-CM

## 2014-10-31 DIAGNOSIS — I10 Essential (primary) hypertension: Secondary | ICD-10-CM

## 2014-10-31 DIAGNOSIS — E1142 Type 2 diabetes mellitus with diabetic polyneuropathy: Secondary | ICD-10-CM

## 2014-10-31 DIAGNOSIS — Z23 Encounter for immunization: Secondary | ICD-10-CM

## 2014-10-31 DIAGNOSIS — E1165 Type 2 diabetes mellitus with hyperglycemia: Secondary | ICD-10-CM

## 2014-10-31 DIAGNOSIS — IMO0002 Reserved for concepts with insufficient information to code with codable children: Secondary | ICD-10-CM

## 2014-10-31 DIAGNOSIS — E1140 Type 2 diabetes mellitus with diabetic neuropathy, unspecified: Secondary | ICD-10-CM

## 2014-10-31 LAB — POCT GLYCOSYLATED HEMOGLOBIN (HGB A1C): HEMOGLOBIN A1C: 6

## 2014-10-31 NOTE — Patient Instructions (Signed)
Great job on diabetes.  If you have any low blood sugar spells, let me know If the rare lightheaded spells get more frequent, I may want to decrease some of your blood pressure medications.  You will get a Tetanus, whooping cough booster today. See me in one month to keep working on this toe.

## 2014-11-01 NOTE — Assessment & Plan Note (Signed)
At goal.  

## 2014-11-01 NOTE — Assessment & Plan Note (Signed)
Doing well with DM at goal

## 2014-11-01 NOTE — Progress Notes (Signed)
   Subjective:    Patient ID: Becky Fields, female    DOB: 1945-09-11, 69 y.o.   MRN: [id number]  HPI  HBP good control  She does have some rare symptoms that sound orthostatic.   A1C is great.  No low blood sugar spells.   Review of Systems     Objective:   Physical Exam Diabetic foot exam done.  Toe ulcer remains clean.  Once again debrided.  Not as deep as it was previously.        Assessment & Plan:

## 2014-11-01 NOTE — Assessment & Plan Note (Signed)
Still clean and dry and hyperkarototic.  Debrided.  Not as deep.  Obviously, this is slow to heal.

## 2014-11-08 ENCOUNTER — Other Ambulatory Visit: Payer: Self-pay | Admitting: Family Medicine

## 2014-11-15 ENCOUNTER — Other Ambulatory Visit: Payer: Medicare Other

## 2014-11-15 DIAGNOSIS — B182 Chronic viral hepatitis C: Secondary | ICD-10-CM | POA: Diagnosis not present

## 2014-11-16 ENCOUNTER — Ambulatory Visit
Admission: RE | Admit: 2014-11-16 | Discharge: 2014-11-16 | Disposition: A | Discharge status: Home / Self Care | Payer: Medicare Other | Source: Ambulatory Visit | Attending: Internal Medicine | Admitting: Internal Medicine

## 2014-11-16 DIAGNOSIS — K7460 Unspecified cirrhosis of liver: Secondary | ICD-10-CM

## 2014-11-18 LAB — HEPATITIS C RNA QUANTITATIVE: HCV Quantitative: NOT DETECTED IU/mL (ref <15)

## 2014-11-21 ENCOUNTER — Ambulatory Visit (INDEPENDENT_AMBULATORY_CARE_PROVIDER_SITE_OTHER): Payer: Medicare Other | Admitting: Family Medicine

## 2014-11-21 ENCOUNTER — Encounter: Payer: Self-pay | Admitting: Family Medicine

## 2014-11-21 VITALS — BP 119/56 | HR 83 | Temp 98.0°F | Ht 62.0 in | Wt 171.7 lb

## 2014-11-21 DIAGNOSIS — L97511 Non-pressure chronic ulcer of other part of right foot limited to breakdown of skin: Secondary | ICD-10-CM

## 2014-11-21 DIAGNOSIS — I10 Essential (primary) hypertension: Secondary | ICD-10-CM | POA: Diagnosis not present

## 2014-11-21 MED ORDER — HYDROCHLOROTHIAZIDE 25 MG PO TABS: 12.5000 mg | ORAL_TABLET | Freq: Every day | ORAL | Status: DC

## 2014-11-21 NOTE — Patient Instructions (Signed)
Keep the ulcer moist and covered.   See me in one month. Start taking 1/2 tab of the hydrochlorothiazide daily.

## 2014-11-22 NOTE — Progress Notes (Signed)
   Subjective:    Patient ID: Becky Fields, female    DOB: 05-23-1946, 69 y.o.   MRN: [id number]  HPI Recheck diabetic foot ulcer.  No pain, redness or swelling.  Still with ulcer and callous    Review of Systems     Objective:   Physical ExamUlcer on plantar aspect of right great toe.  Non infected.  Callous trimmed.          Assessment & Plan:

## 2014-11-22 NOTE — Assessment & Plan Note (Signed)
Again trimmed, discussed local care and keeping pressure off.  Unfortunately, no evidence of healing yet.

## 2014-11-22 NOTE — Assessment & Plan Note (Signed)
BP has been good here on multiple occasions.  Is having some lightheaded spells. Will try to cut back HCTZ dose.

## 2014-11-29 ENCOUNTER — Encounter: Payer: Self-pay | Admitting: Internal Medicine

## 2014-11-29 ENCOUNTER — Ambulatory Visit (INDEPENDENT_AMBULATORY_CARE_PROVIDER_SITE_OTHER): Payer: Medicare Other | Admitting: Internal Medicine

## 2014-11-29 VITALS — BP 116/75 | HR 73 | Temp 98.3°F | Ht 62.0 in | Wt 171.0 lb

## 2014-11-29 DIAGNOSIS — K7460 Unspecified cirrhosis of liver: Secondary | ICD-10-CM

## 2014-11-29 DIAGNOSIS — B182 Chronic viral hepatitis C: Secondary | ICD-10-CM

## 2014-11-29 NOTE — Assessment & Plan Note (Signed)
SVR12 remains undetectable and now assuredly cured but will recheck one more in 6 months with her next [facility] screening ultrasound.

## 2014-11-29 NOTE — Progress Notes (Signed)
Patient ID: Becky Fields, female   DOB: 1946/04/16, 69 y.o.   MRN: [id number] Patient scheduled for follow up abdominal ultrasound in 6 months per Dr. Linus Salmons. Appointment 12/2, 9:00.  Patient to be NPO for 6-8 hours prior to procedure, understands how to hold her regular medications.  Patient given appointment location, phone number to reschedule if necessary.  Patient verbalized understanding, agreement. No prior authorization required for her insurance (medicare). Landis Gandy, RN

## 2014-11-29 NOTE — Progress Notes (Signed)
   Subjective:    Patient ID: Becky Fields, female    DOB: 12-10-1945, 69 y.o.   MRN: [id number]  HPI Here for follow up of HCV.  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   Now has completed treatment. End of treatment viral response is undetectable.  Now SVR12 is undetectable which confers a 99.6%  No new issues.  Pleased to have finished.  Ultrasound of abd for [facility] screen without masses.      Review of Systems  Constitutional: Negative for fever and fatigue.  Gastrointestinal: Negative for nausea and diarrhea.  Skin: Negative for rash.  Neurological: Negative for dizziness and headaches.       Objective:   Physical Exam  Constitutional: She appears well-developed and well-nourished. No distress.  Eyes: No scleral icterus.  Cardiovascular: Normal rate, regular rhythm and normal heart sounds.   No murmur heard. Pulmonary/Chest: Breath sounds normal. No respiratory distress.  Lymphadenopathy:    She has no cervical adenopathy.  Skin: No rash noted.          Assessment & Plan:

## 2014-12-19 ENCOUNTER — Ambulatory Visit (INDEPENDENT_AMBULATORY_CARE_PROVIDER_SITE_OTHER): Payer: Medicare Other | Admitting: Family Medicine

## 2014-12-19 ENCOUNTER — Encounter: Payer: Self-pay | Admitting: Family Medicine

## 2014-12-19 VITALS — BP 119/63 | HR 71 | Temp 98.0°F | Ht 62.0 in | Wt 169.5 lb

## 2014-12-19 DIAGNOSIS — L97511 Non-pressure chronic ulcer of other part of right foot limited to breakdown of skin: Secondary | ICD-10-CM | POA: Diagnosis not present

## 2014-12-19 DIAGNOSIS — R233 Spontaneous ecchymoses: Secondary | ICD-10-CM | POA: Diagnosis not present

## 2014-12-19 LAB — CBC
HCT: 35.9 % — ABNORMAL LOW (ref 36.0–46.0)
HEMOGLOBIN: 11.6 g/dL — AB (ref 12.0–15.0)
MCH: 24.9 pg — ABNORMAL LOW (ref 26.0–34.0)
MCHC: 32.3 g/dL (ref 30.0–36.0)
MCV: 77 fL — ABNORMAL LOW (ref 78.0–100.0)
MPV: 9.1 fL (ref 8.6–12.4)
Platelets: 172 10*3/uL (ref 150–400)
RBC: 4.66 MIL/uL (ref 3.87–5.11)
RDW: 16.9 % — ABNORMAL HIGH (ref 11.5–15.5)
WBC: 7.3 10*3/uL (ref 4.0–10.5)

## 2014-12-19 NOTE — Patient Instructions (Signed)
Please start using the donuts regularly on the toe. See you in a month.

## 2014-12-20 DIAGNOSIS — H5203 Hypermetropia, bilateral: Secondary | ICD-10-CM | POA: Diagnosis not present

## 2014-12-20 DIAGNOSIS — I10 Essential (primary) hypertension: Secondary | ICD-10-CM | POA: Diagnosis not present

## 2014-12-20 DIAGNOSIS — E119 Type 2 diabetes mellitus without complications: Secondary | ICD-10-CM | POA: Diagnosis not present

## 2014-12-20 DIAGNOSIS — H35039 Hypertensive retinopathy, unspecified eye: Secondary | ICD-10-CM | POA: Diagnosis not present

## 2014-12-20 DIAGNOSIS — Z961 Presence of intraocular lens: Secondary | ICD-10-CM | POA: Diagnosis not present

## 2014-12-20 DIAGNOSIS — H04129 Dry eye syndrome of unspecified lacrimal gland: Secondary | ICD-10-CM | POA: Diagnosis not present

## 2014-12-20 DIAGNOSIS — H52223 Regular astigmatism, bilateral: Secondary | ICD-10-CM | POA: Diagnosis not present

## 2014-12-20 LAB — HM DIABETES EYE EXAM

## 2014-12-20 NOTE — Assessment & Plan Note (Signed)
Check platelets via [facility]

## 2014-12-20 NOTE — Progress Notes (Signed)
   Subjective:    Patient ID: Becky Fields, female    DOB: 06/16/1946, 69 y.o.   MRN: [id number]  HPI Continued callous/ulcer of right great toe.  No redness.  She has not been using donut pads due to awkward location.  Diabetes control good.  New rash on legs.    Review of Systems     Objective:   Physical Exam Diabetic foot exam documented.  Good pulses. Callous trimmed.  Ulcer not infected and not a lot different.  Scattered petechia both legs - anterior shins.         Assessment & Plan:

## 2014-12-20 NOTE — Assessment & Plan Note (Signed)
Again debrided.  Empasize offloading pressure with donut pads.

## 2015-01-21 ENCOUNTER — Other Ambulatory Visit: Payer: Self-pay | Admitting: Family Medicine

## 2015-01-23 ENCOUNTER — Ambulatory Visit (INDEPENDENT_AMBULATORY_CARE_PROVIDER_SITE_OTHER): Payer: Medicare Other | Admitting: Family Medicine

## 2015-01-23 ENCOUNTER — Encounter: Payer: Self-pay | Admitting: Family Medicine

## 2015-01-23 VITALS — BP 130/71 | HR 68 | Temp 97.9°F | Ht 62.0 in | Wt 170.8 lb

## 2015-01-23 DIAGNOSIS — E1140 Type 2 diabetes mellitus with diabetic neuropathy, unspecified: Secondary | ICD-10-CM

## 2015-01-23 DIAGNOSIS — I10 Essential (primary) hypertension: Secondary | ICD-10-CM

## 2015-01-23 DIAGNOSIS — E1142 Type 2 diabetes mellitus with diabetic polyneuropathy: Secondary | ICD-10-CM

## 2015-01-23 DIAGNOSIS — L600 Ingrowing nail: Secondary | ICD-10-CM | POA: Diagnosis not present

## 2015-01-23 DIAGNOSIS — L97511 Non-pressure chronic ulcer of other part of right foot limited to breakdown of skin: Secondary | ICD-10-CM | POA: Diagnosis not present

## 2015-01-23 LAB — POCT GLYCOSYLATED HEMOGLOBIN (HGB A1C): HEMOGLOBIN A1C: 6.2

## 2015-01-23 NOTE — Patient Instructions (Signed)
Maybe the hole is smaller and a little less deep.  The ulcer does not look infected, but I am worried about the toenail.  See me in one month.  Sooner if problems. The A1C=6.2.  Great control of your diabetes.

## 2015-01-24 DIAGNOSIS — L600 Ingrowing nail: Secondary | ICD-10-CM | POA: Insufficient documentation

## 2015-01-24 NOTE — Assessment & Plan Note (Signed)
Well controled. 

## 2015-01-24 NOTE — Assessment & Plan Note (Signed)
Continue conservative care.  Emphasize relieving pressure to allow to heal.

## 2015-01-24 NOTE — Assessment & Plan Note (Signed)
Great control.  She must have neuropathy affecting the right great toe with ulcer and now ingrown toenail

## 2015-01-24 NOTE — Progress Notes (Signed)
   Subjective:    Patient ID: Becky Fields, female    DOB: Apr 16, 1946, 69 y.o.   MRN: [id number]  HPI Recheck right toe ulcer No redness or drainage.  Unfortunately, has an ingrown toenail on same digit.   Also due for A1C Tolerating meds without difficulty.    Review of Systems     Objective:   Physical Exam VS noted, good BP control See diabetic foot exam plus. Mild ingrown toenail Rt great toe, medial aspect. Callous debrided, ulcer clean and dry A1C great at 6.2       Assessment & Plan:

## 2015-01-24 NOTE — Assessment & Plan Note (Signed)
No infection.  She will try conservative treatment before we jump to removal.

## 2015-01-28 IMAGING — CR DG TOE GREAT 2+V*R*
3 series · 3 of 3 positions shown · non-contrast
Comparison: None.

CLINICAL DATA: Open sore on first toe, initial encounter

EXAM:
RIGHT GREAT TOE

[view not recorded (1 of 3)]
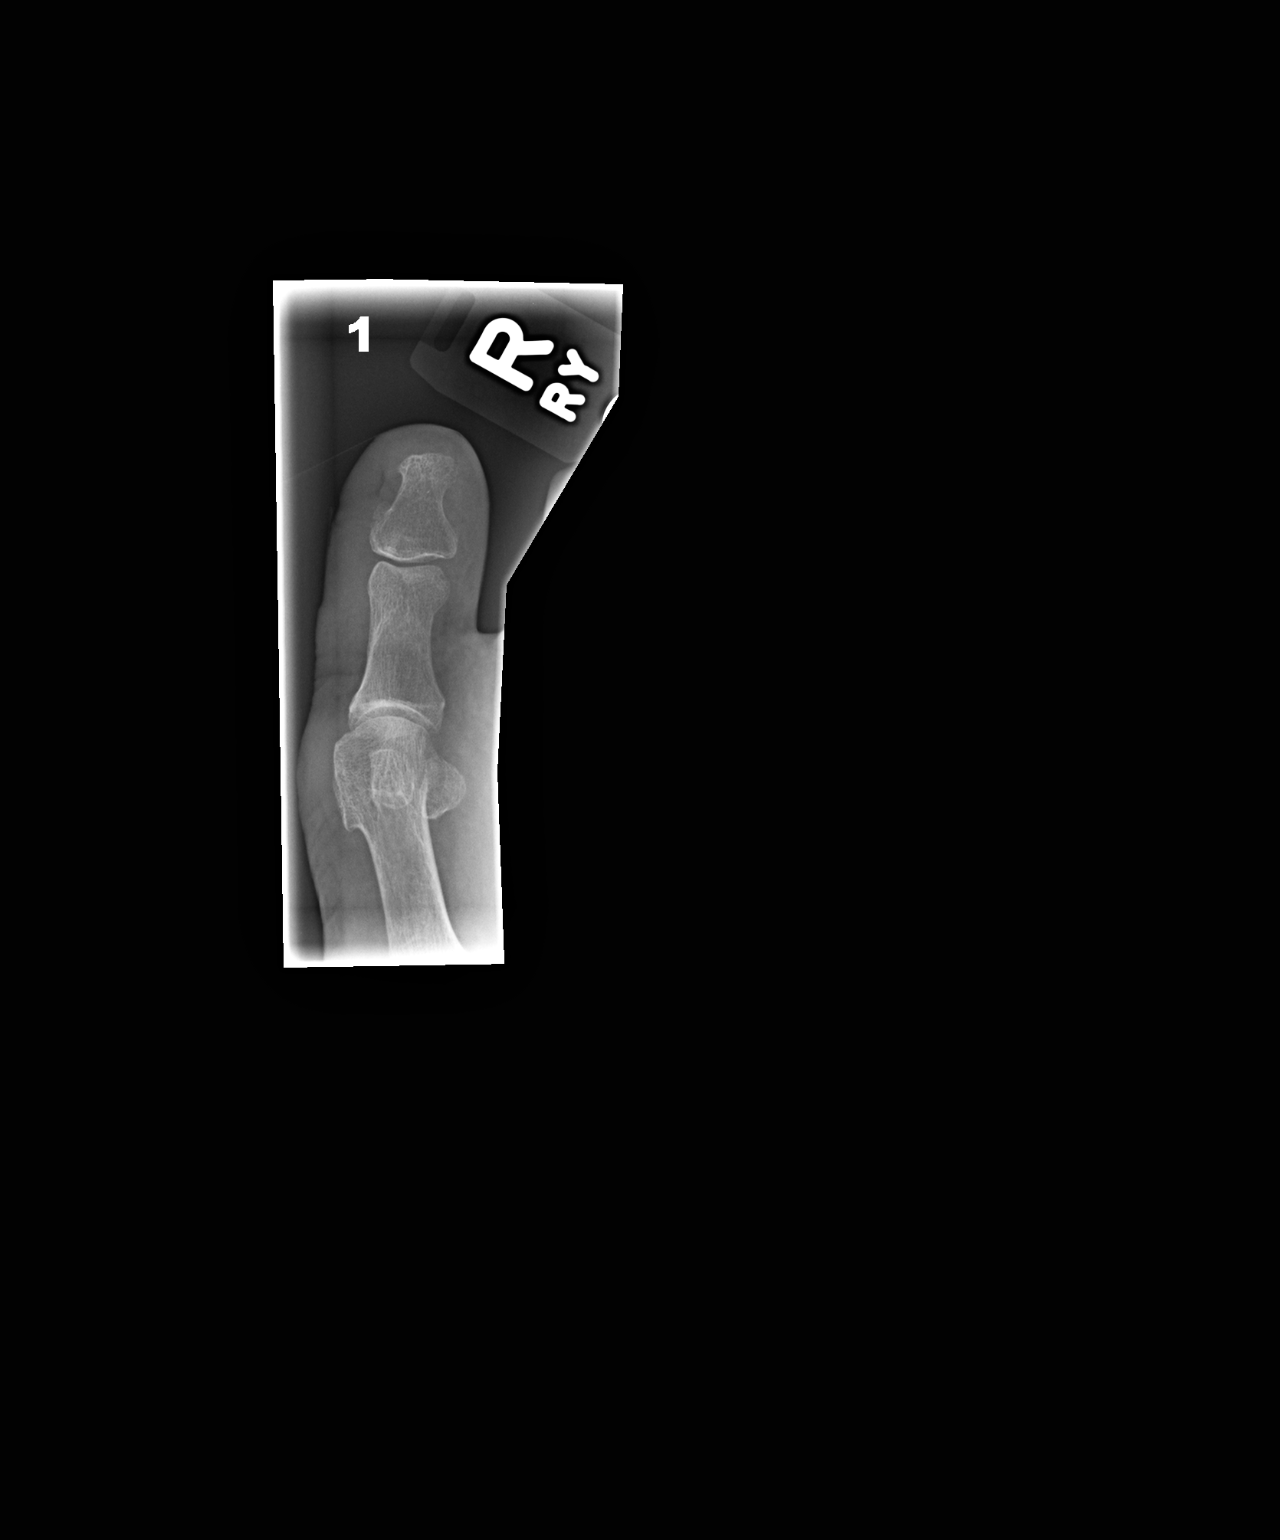

[view not recorded (2 of 3)]
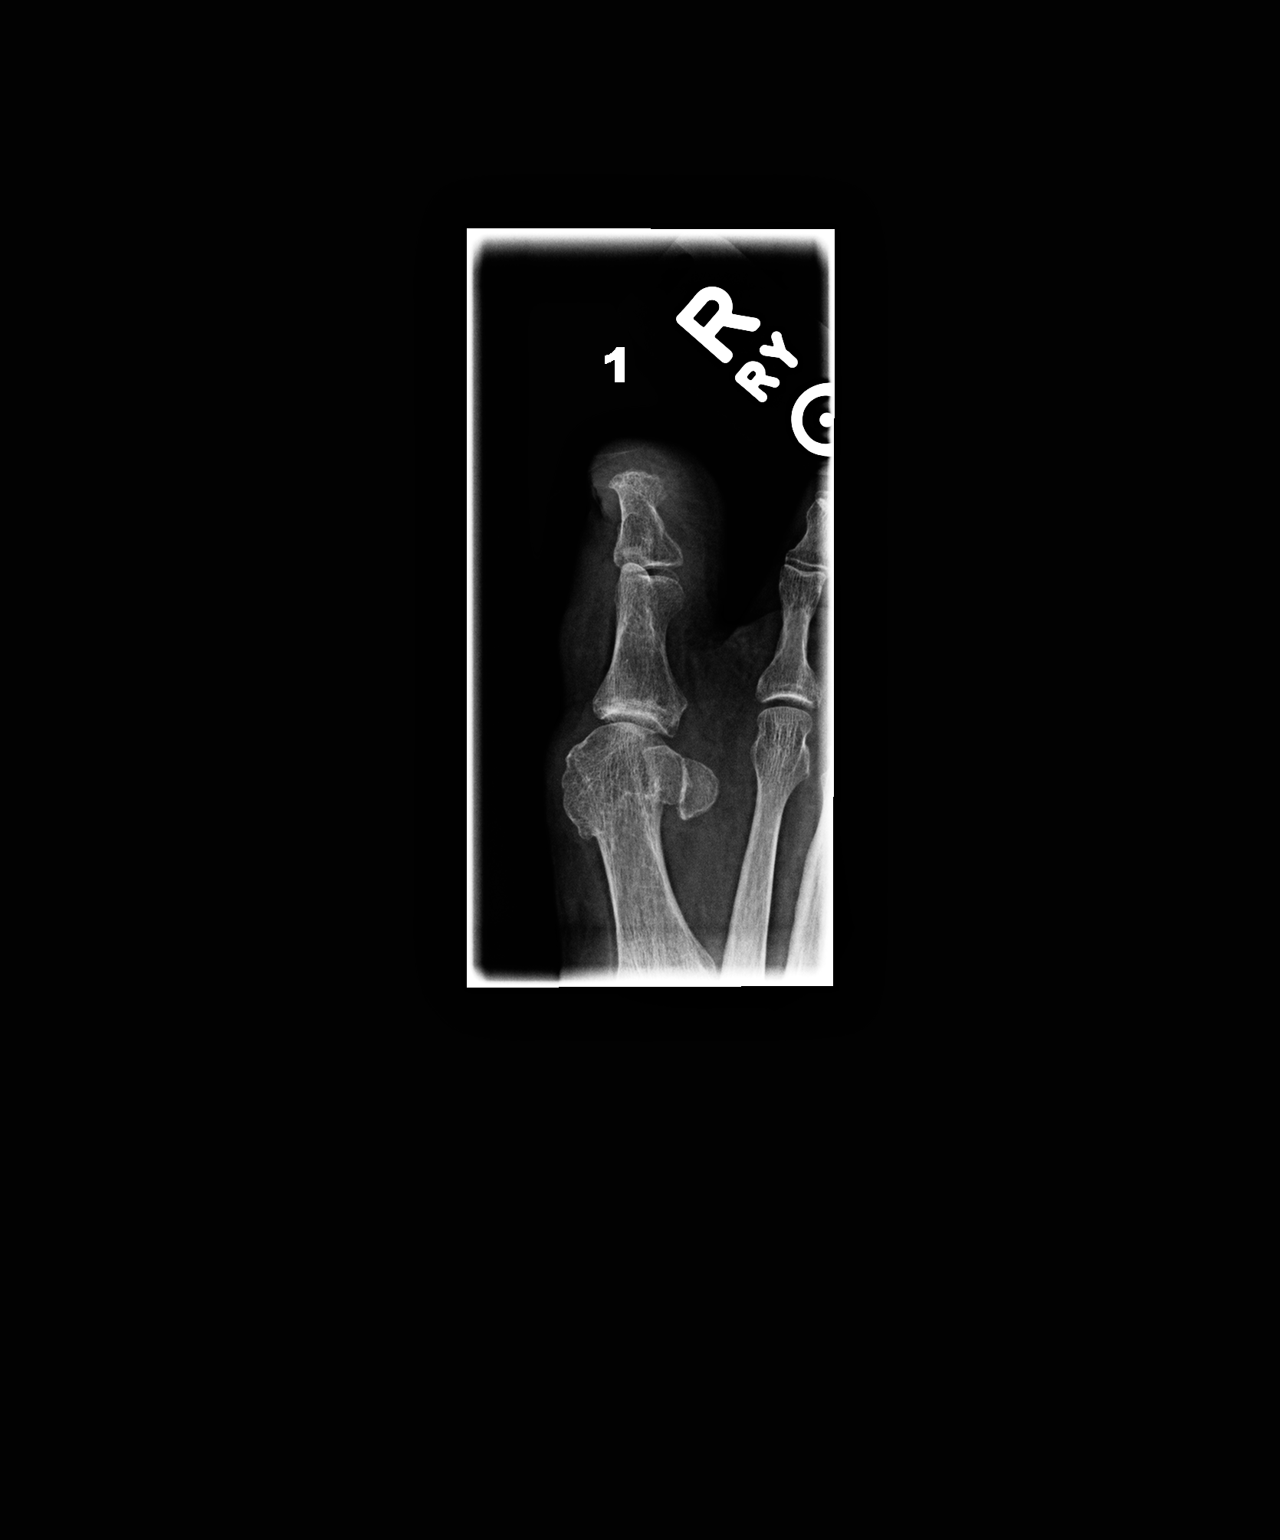

[view not recorded (3 of 3)]
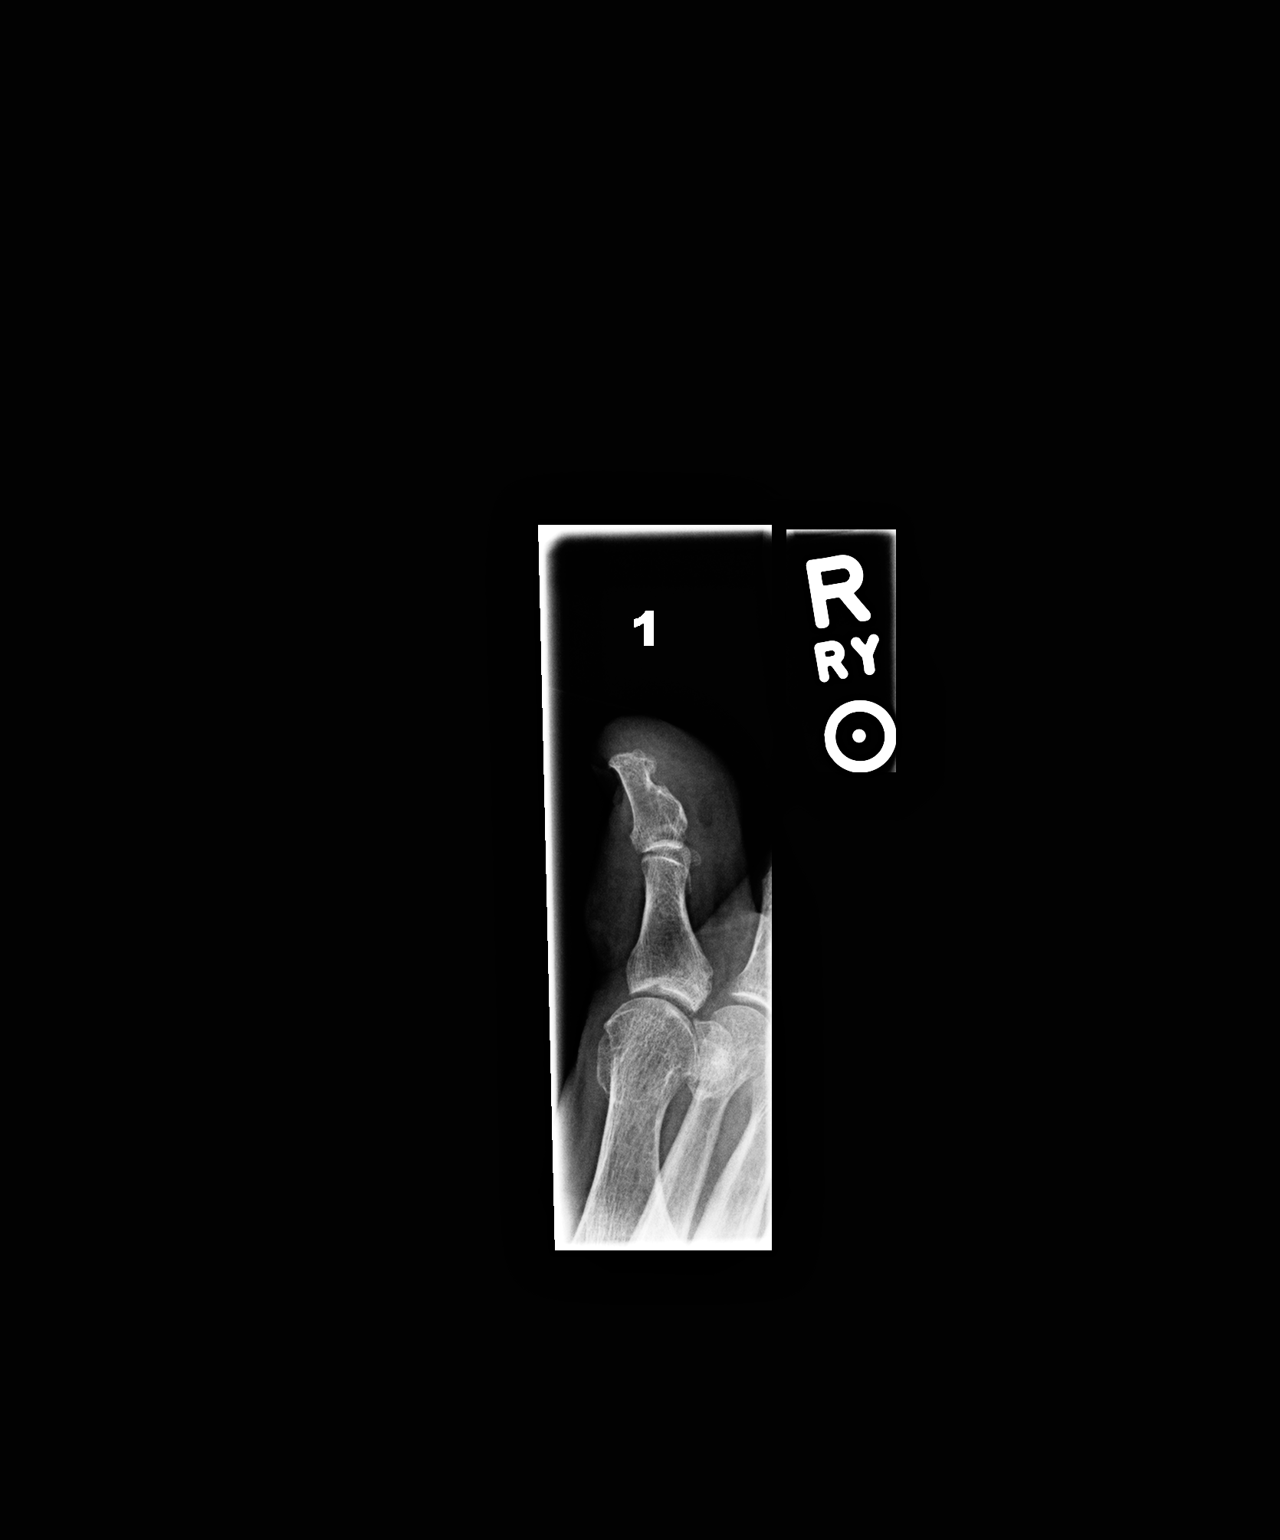

[3 of 3 positions shown; findings below may reference images not displayed]

FINDINGS: No acute fracture dislocation is noted. No bony resorption is seen.
Mild hallux valgus deformity is noted. A soft tissue defect is noted
on the plantar aspect of the distal toe.
IMPRESSION: Soft tissue changes without acute bony abnormality.

## 2015-02-05 ENCOUNTER — Other Ambulatory Visit: Payer: Self-pay | Admitting: Family Medicine

## 2015-02-22 ENCOUNTER — Encounter: Payer: Self-pay | Admitting: Family Medicine

## 2015-02-22 ENCOUNTER — Ambulatory Visit (INDEPENDENT_AMBULATORY_CARE_PROVIDER_SITE_OTHER): Payer: Medicare Other | Admitting: Family Medicine

## 2015-02-22 VITALS — BP 132/107 | HR 68 | Temp 97.8°F | Ht 62.0 in | Wt 167.9 lb

## 2015-02-22 DIAGNOSIS — Z23 Encounter for immunization: Secondary | ICD-10-CM

## 2015-02-22 DIAGNOSIS — L97511 Non-pressure chronic ulcer of other part of right foot limited to breakdown of skin: Secondary | ICD-10-CM | POA: Diagnosis present

## 2015-02-22 DIAGNOSIS — L600 Ingrowing nail: Secondary | ICD-10-CM

## 2015-02-22 NOTE — Progress Notes (Signed)
   Subjective:    Patient ID: Becky Fields, female    DOB: 11-Jun-1946, 69 y.o.   MRN: [id number]  HPI Foot ulcer recheck.  Plantar surface Right great toe.  Doing well.  Thinks it is smaller. Also we have been watching the same great toenail for a month.  Seems to be improving on own. Flu shot today.    Review of Systems     Objective:   Physical Exam Great toenail right no obvious shard.  No infection.  Mild inflamation. Callous and ulcer debrided.  Clean base.  Not deep .  Does look smaller.       Assessment & Plan:

## 2015-02-22 NOTE — Assessment & Plan Note (Signed)
Improving, non infected.

## 2015-02-22 NOTE — Patient Instructions (Signed)
Thank you for coming in today!  You got your flu shot today and see you in a month for a checkup on the foot. Have your ophthalmologist send your records over so we can update your eye exam status.

## 2015-02-22 NOTE — Assessment & Plan Note (Signed)
Improving  - continue to monitor

## 2015-03-21 ENCOUNTER — Ambulatory Visit (INDEPENDENT_AMBULATORY_CARE_PROVIDER_SITE_OTHER): Payer: Medicare Other | Admitting: Family Medicine

## 2015-03-21 VITALS — BP 132/68 | HR 71 | Temp 97.8°F | Wt 165.4 lb

## 2015-03-21 DIAGNOSIS — R300 Dysuria: Secondary | ICD-10-CM

## 2015-03-21 DIAGNOSIS — L97511 Non-pressure chronic ulcer of other part of right foot limited to breakdown of skin: Secondary | ICD-10-CM

## 2015-03-21 DIAGNOSIS — N3000 Acute cystitis without hematuria: Secondary | ICD-10-CM | POA: Diagnosis not present

## 2015-03-21 DIAGNOSIS — N390 Urinary tract infection, site not specified: Secondary | ICD-10-CM | POA: Insufficient documentation

## 2015-03-21 DIAGNOSIS — Z86010 Personal history of colonic polyps: Secondary | ICD-10-CM

## 2015-03-21 DIAGNOSIS — R197 Diarrhea, unspecified: Secondary | ICD-10-CM | POA: Insufficient documentation

## 2015-03-21 LAB — POCT URINALYSIS DIPSTICK
BILIRUBIN UA: NEGATIVE
Glucose, UA: NEGATIVE
KETONES UA: NEGATIVE
Nitrite, UA: NEGATIVE
Protein, UA: NEGATIVE
Spec Grav, UA: 1.02
Urobilinogen, UA: 0.2
pH, UA: 5

## 2015-03-21 MED ORDER — CEPHALEXIN 500 MG PO CAPS: 500.0000 mg | ORAL_CAPSULE | Freq: Two times a day (BID) | ORAL | Status: DC

## 2015-03-21 NOTE — Assessment & Plan Note (Signed)
Refer for surveillance colonoscopy

## 2015-03-21 NOTE — Patient Instructions (Signed)
Your foot looks good. Get your colonoscopy with [place].  First try off all milk and milk products.  Yogurt is fine If the milk doesn't help, try off metformin for 2 weeks.  If the metformin is the problem, we will need to figure out what to do with your diabetes.

## 2015-03-22 ENCOUNTER — Encounter: Payer: Self-pay | Admitting: Family Medicine

## 2015-03-22 ENCOUNTER — Other Ambulatory Visit: Payer: Self-pay | Admitting: Family Medicine

## 2015-03-22 NOTE — Assessment & Plan Note (Signed)
Trial off milk products.  If still diarrhea, trial off metformin.

## 2015-03-22 NOTE — Assessment & Plan Note (Signed)
Rx keflex 

## 2015-03-22 NOTE — Assessment & Plan Note (Addendum)
Continue local care and monthly follow up.

## 2015-03-22 NOTE — Progress Notes (Signed)
   Subjective:    Patient ID: Becky Fields, female    DOB: 1945-08-18, 69 y.o.   MRN: [id number]  HPI Recheck right great toe ulcer.  Seems to be slowly healing.  States using donut cushions. Need flu shot 24-36 hours of urgency and frequency and  Dysuria.  No fever or flank pain. Also having diarrhea.  No clear cause.  No blood.  On metformin.   Review of Systems     Objective:   Physical Exam No CVA tendernes UA reviewed. Rt toe callous trimmed.  Ulcer is small and less deep.        Assessment & Plan:

## 2015-03-25 ENCOUNTER — Encounter: Payer: Self-pay | Admitting: Internal Medicine

## 2015-03-25 ENCOUNTER — Encounter: Payer: Self-pay | Admitting: Family Medicine

## 2015-04-04 ENCOUNTER — Encounter: Payer: Self-pay | Admitting: Nurse Practitioner

## 2015-04-16 ENCOUNTER — Encounter: Payer: Self-pay | Admitting: Family Medicine

## 2015-04-17 ENCOUNTER — Encounter: Payer: Self-pay | Admitting: *Deleted

## 2015-04-17 ENCOUNTER — Encounter: Payer: Self-pay | Admitting: Family Medicine

## 2015-04-17 ENCOUNTER — Ambulatory Visit (INDEPENDENT_AMBULATORY_CARE_PROVIDER_SITE_OTHER): Payer: Medicare Other | Admitting: Family Medicine

## 2015-04-17 VITALS — BP 116/58 | HR 66 | Temp 98.2°F | Wt 165.0 lb

## 2015-04-17 DIAGNOSIS — L97511 Non-pressure chronic ulcer of other part of right foot limited to breakdown of skin: Secondary | ICD-10-CM

## 2015-04-17 NOTE — Assessment & Plan Note (Signed)
Looks great.  FU one month.  Hopefully it will finally be healed.  Continue pressure relief care

## 2015-04-17 NOTE — Patient Instructions (Signed)
You are finally doing great with your toe. See me in one month.

## 2015-04-17 NOTE — Progress Notes (Signed)
   Subjective:    Patient ID: Becky Fields, female    DOB: 08-03-1945, 69 y.o.   MRN: [id number]  HPI No problem with toe.  Continues local care.  She thinks it is finally improving.  No fever or redness noted Will have colonoscopy after the first of the year.    Review of Systems     Objective:   Physical ExamRight great toe callous trimmed.  The former ulcer now has a thin eschar over it.  Perhaps it is finally healing.  No redness        Assessment & Plan:

## 2015-05-10 ENCOUNTER — Ambulatory Visit (INDEPENDENT_AMBULATORY_CARE_PROVIDER_SITE_OTHER): Payer: Medicare Other | Admitting: Emergency Medicine

## 2015-05-10 ENCOUNTER — Ambulatory Visit (INDEPENDENT_AMBULATORY_CARE_PROVIDER_SITE_OTHER): Payer: Medicare Other

## 2015-05-10 VITALS — BP 80/46 | HR 68 | Temp 98.0°F | Resp 16 | Ht 59.75 in | Wt 163.0 lb

## 2015-05-10 DIAGNOSIS — E1142 Type 2 diabetes mellitus with diabetic polyneuropathy: Secondary | ICD-10-CM | POA: Diagnosis present

## 2015-05-10 DIAGNOSIS — M25532 Pain in left wrist: Secondary | ICD-10-CM

## 2015-05-10 DIAGNOSIS — L97511 Non-pressure chronic ulcer of other part of right foot limited to breakdown of skin: Secondary | ICD-10-CM | POA: Diagnosis not present

## 2015-05-10 DIAGNOSIS — W19XXXA Unspecified fall, initial encounter: Secondary | ICD-10-CM | POA: Diagnosis not present

## 2015-05-10 DIAGNOSIS — I10 Essential (primary) hypertension: Secondary | ICD-10-CM | POA: Diagnosis not present

## 2015-05-10 MED ORDER — HYDROCODONE-ACETAMINOPHEN 5-325 MG PO TABS: 1.0000 | ORAL_TABLET | ORAL | Status: DC | PRN

## 2015-05-10 NOTE — Progress Notes (Signed)
Thumb spica splint applied tot he LEFT forearm.

## 2015-05-10 NOTE — Progress Notes (Signed)
Subjective:  Patient ID: Becky Fields, female    DOB: 06/27/1945  Age: 69 y.o. MRN: [id number]  CC: Fall; Arm Pain; and Facial Pain   HPI Becky Fields presents  patient tripped and fell on her outstretched hand and has pain in her left wrist. She's been putting ice on it taking Motrin with no improvement of pain she decided they should come in and get checked. She denies any syncope or weakness contributing to the fall. She had no loss consciousness. She denies any other complaints  History Becky Fields has a past medical history of Diabetes mellitus; Hypertension; Cataract; Hepatitis C; Hypertriglyceridemia; Takotsubo syndrome; and Ejection fraction.   She has past surgical history that includes Cholecystectomy; Eye surgery (02/2012); and left heart catheterization with coronary angiogram (N/A, 10/17/2012).   Her  family history includes Aneurysm in her father; CAD (age of onset: 68) in her brother; Hypertension in her mother and sister. There is no history of Heart attack or Stroke.  She   reports that she has never smoked. She has never used smokeless tobacco. She reports that she drinks about 3.6 oz of alcohol per week. She reports that she does not use illicit drugs.  Outpatient Prescriptions Prior to Visit  Medication Sig Dispense Refill  . aspirin 81 MG tablet Take 81 mg by mouth daily.      Marland Kitchen atorvastatin (LIPITOR) 40 MG tablet TAKE ONE TABLET BY MOUTH ONCE DAILY 90 tablet 3  . EQ LORATADINE 10 MG tablet TAKE ONE TABLET BY MOUTH ONCE DAILY AS NEEDED FOR  ALLERGY 90 tablet 3  . fluticasone (FLONASE) 50 MCG/ACT nasal spray Place 2 sprays into both nostrils daily. 16 g 12  . glimepiride (AMARYL) 4 MG tablet TAKE TWO TABLETS BY MOUTH ONCE DAILY BEFORE BREAKFAST 180 tablet 3  . hydrochlorothiazide (HYDRODIURIL) 25 MG tablet TAKE ONE TABLET BY MOUTH ONCE DAILY 90 tablet 3  . losartan (COZAAR) 100 MG tablet TAKE ONE TABLET BY MOUTH ONCE DAILY 90 tablet 3  . metFORMIN (GLUCOPHAGE) 1000 MG  tablet TAKE ONE TABLET BY MOUTH TWICE DAILY 180 tablet 3  . metoprolol (LOPRESSOR) 100 MG tablet TAKE ONE TABLET BY MOUTH TWICE DAILY 180 tablet 3  . Multiple Vitamin (MULTIVITAMIN WITH MINERALS) TABS Take 1 tablet by mouth daily.    . nitroGLYCERIN (NITROSTAT) 0.4 MG SL tablet Place 1 tablet (0.4 mg total) under the tongue every 5 (five) minutes x 3 doses as needed for chest pain. 25 tablet 3  . Polyethyl Glycol-Propyl Glycol (SYSTANE OP) Apply 1 drop to eye 2 (two) times daily as needed (dry eyes).    . sitaGLIPtin (JANUVIA) 100 MG tablet Take 1 tablet (100 mg total) by mouth daily. 90 tablet 3  . zolpidem (AMBIEN) 10 MG tablet TAKE ONE TABLET BY MOUTH AT BEDTIME AS NEEDED FOR SLEEP 30 tablet 5  . cephALEXin (KEFLEX) 500 MG capsule Take 1 capsule (500 mg total) by mouth 2 (two) times daily. 6 capsule 0   No facility-administered medications prior to visit.    Social History   Social History  . Marital Status: Widowed    Spouse Name: Ron  . Number of Children: 2  . Years of Education: Bachelors   Occupational History  . office assistant    Social History Main Topics  . Smoking status: Never Smoker   . Smokeless tobacco: Never Used  . Alcohol Use: 3.6 oz/week    6 Standard drinks or equivalent per week     Comment: Occasional glass  of wine on the weekends  . Drug Use: No  . Sexual Activity: Not Currently   Other Topics Concern  . None   Social History Narrative   Health Care POA:    Emergency Contact: husband, Canon Oriordan (c) [phone]   End of Life Plan:    Who lives with you: husband, Ron, occasionally son who works out of town   Any pets: 2 dogs, 2 cats   Diet: Pt has a varied diet of protein, starch, vegetables.  Pt is not currently on any kind of diet modifications for DM control.   Exercise: Pt has not regular exercise routine.   Seatbelts: Pt reports wearing seatbelt when in vehicles.    Sun Exposure/Protection: Pt reports not wearing sun protection.   Hobbies:  Spending time with granddaughter, reading           Review of Systems  Constitutional: Negative for fever, chills and appetite change.  HENT: Negative for congestion, ear pain, postnasal drip, sinus pressure and sore throat.   Eyes: Negative for pain and redness.  Respiratory: Negative for cough, shortness of breath and wheezing.   Cardiovascular: Negative for leg swelling.  Gastrointestinal: Negative for nausea, vomiting, abdominal pain, diarrhea, constipation and blood in stool.  Endocrine: Negative for polyuria.  Genitourinary: Negative for dysuria, urgency, frequency and flank pain.  Musculoskeletal: Positive for joint swelling. Negative for gait problem.  Skin: Negative for rash.  Neurological: Negative for weakness and headaches.  Psychiatric/Behavioral: Negative for confusion and decreased concentration. The patient is not nervous/anxious.     Objective:  BP 80/46 mmHg  Pulse 68  Temp(Src) 98 F (36.7 C) (Oral)  Resp 16  Ht 4' 11.75" (1.518 m)  Wt 163 lb (73.936 kg)  BMI 32.09 kg/m2  SpO2 98%  Physical Exam  Constitutional: She is oriented to person, place, and time. She appears well-developed and well-nourished. No distress.  HENT:  Head: Normocephalic and atraumatic.  Right Ear: External ear normal.  Left Ear: External ear normal.  Nose: Nose normal.  Eyes: Conjunctivae and EOM are normal. Pupils are equal, round, and reactive to light. No scleral icterus.  Neck: Normal range of motion. Neck supple. No tracheal deviation present.  Cardiovascular: Normal rate, regular rhythm and normal heart sounds.   Pulmonary/Chest: Effort normal. No respiratory distress. She has no wheezes. She has no rales.  Abdominal: She exhibits no mass. There is no tenderness. There is no rebound and no guarding.  Musculoskeletal: She exhibits no edema.  Lymphadenopathy:    She has no cervical adenopathy.  Neurological: She is alert and oriented to person, place, and time. Coordination  normal.  Skin: Skin is warm and dry. No rash noted.  Psychiatric: She has a normal mood and affect. Her behavior is normal.   She has tenderness in her left wrist in the anatomic snuffbox. There is no ecchymosis or deformity. She has minimal swelling. Very significantly guarding. She also has been contusion and tenderness in her left cheek but there is no double vision or blurred vision and she is not crepitant   Assessment & Plan:   Lillianna was seen today for fall, arm pain and facial pain.  Diagnoses and all orders for this visit:  Left wrist pain -     DG Wrist Complete Left; Future  Other orders -     HYDROcodone-acetaminophen (NORCO) 5-325 MG tablet; Take 1-2 tablets by mouth every 4 (four) hours as needed.   I have discontinued Ms. Hoctor's cephALEXin. I am  also having her start on HYDROcodone-acetaminophen. Additionally, I am having her maintain her aspirin, multivitamin with minerals, Polyethyl Glycol-Propyl Glycol (SYSTANE OP), fluticasone, zolpidem, nitroGLYCERIN, sitaGLIPtin, losartan, metFORMIN, glimepiride, atorvastatin, hydrochlorothiazide, EQ LORATADINE, and metoprolol.  Meds ordered this encounter  Medications  . HYDROcodone-acetaminophen (NORCO) 5-325 MG tablet    Sig: Take 1-2 tablets by mouth every 4 (four) hours as needed.    Dispense:  30 tablet    Refill:  0   She was fitted with a thumb spica splint and will follow back in a week for repeat x-ray if that's indicated  Appropriate red flag conditions were discussed with the patient as well as actions that should be taken.  Patient expressed his understanding.  Follow-up: Return in about 1 week (around 05/17/2015).  Roselee Culver, MD   [facility] reading (PRIMARY) by  Dr. Ouida Sills.  ?disclocation.

## 2015-05-10 NOTE — Patient Instructions (Signed)
Scaphoid Fracture, Wrist A fracture is a break in the bone. The bone you have broken often does not show up as a fracture on x-ray until later on in the healing phase. This bone is called the scaphoid bone. With this bone, your caregiver will often cast or splint your wrist as though it is fractured, even if a fracture is not seen on the x-ray. This is often done with wrist injuries in which there is tenderness at the base of the thumb. An x-ray at 1-3 weeks after your injury may confirm this fracture. A cast or splint is used to protect and keep your injured bone in good position for healing. The cast or splint will be on generally for about 6 to 16 weeks, depending on your health, age, the fracture location and how quickly you heal. Another name for the scaphoid bone is the navicular bone. HOME CARE INSTRUCTIONS   To lessen the swelling and pain, keep the injured part elevated above your heart while sitting or lying down.  Apply ice to the injury for 15-20 minutes, 03-04 times per day while awake, for 2 days. Put the ice in a plastic bag and place a thin towel between the bag of ice and your cast.  If you have a plaster or fiberglass cast or splint:  Do not try to scratch the skin under the cast using sharp or pointed objects.  Check the skin around the cast every day. You may put lotion on any red or sore areas.  Keep your cast or splint dry and clean.  If you have a plaster splint:  Wear the splint as directed.  You may loosen the elastic bandage around the splint if your fingers become numb, tingle, or turn cold or blue.  If you have been put in a removable splint, wear and use as directed.  Do not put pressure on any part of your cast or splint; it may deform or break. Rest your cast or splint only on a pillow the first 24 hours until it is fully hardened.  Your cast or splint can be protected during bathing with a plastic bag. Do not lower the cast or splint into water.  Only take  over-the-counter or prescription medicines for pain, discomfort, or fever as directed by your caregiver.  If your caregiver has given you a follow up appointment, it is very important to keep that appointment. Not keeping the appointment could result in chronic pain and decreased function. If there is any problem keeping the appointment, you must call back to this facility for assistance. SEEK IMMEDIATE MEDICAL CARE IF:   Your cast gets damaged, wet or breaks.  You have continued severe pain or more swelling than you did before the cast or splint was put on.  Your skin or nails below the injury turn blue or gray, or feel cold or numb.  You have tingling or burning pain in your fingers or increasing pain with movement of your fingers   This information is not intended to replace advice given to you by your health care provider. Make sure you discuss any questions you have with your health care provider.   Document Released: 05/29/2002 Document Revised: 08/31/2011 Document Reviewed: 12/19/2014 Elsevier Interactive Patient Education [employer].

## 2015-05-15 ENCOUNTER — Ambulatory Visit: Payer: Medicare Other | Admitting: Nurse Practitioner

## 2015-05-17 ENCOUNTER — Ambulatory Visit: Payer: Medicare Other | Admitting: Nurse Practitioner

## 2015-05-18 ENCOUNTER — Ambulatory Visit (INDEPENDENT_AMBULATORY_CARE_PROVIDER_SITE_OTHER): Payer: Medicare Other

## 2015-05-18 ENCOUNTER — Ambulatory Visit (INDEPENDENT_AMBULATORY_CARE_PROVIDER_SITE_OTHER): Payer: Medicare Other | Admitting: Physician Assistant

## 2015-05-18 VITALS — BP 110/68 | HR 82 | Temp 97.3°F | Resp 17 | Ht 62.0 in | Wt 164.6 lb

## 2015-05-18 DIAGNOSIS — M25532 Pain in left wrist: Secondary | ICD-10-CM | POA: Diagnosis not present
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
## 2015-05-20 ENCOUNTER — Other Ambulatory Visit: Payer: Medicare Other

## 2015-05-20 DIAGNOSIS — B182 Chronic viral hepatitis C: Secondary | ICD-10-CM

## 2015-05-21 LAB — HEPATITIS C RNA QUANTITATIVE: HCV Quantitative: NOT DETECTED IU/mL (ref <15)

## 2015-05-22 ENCOUNTER — Ambulatory Visit (INDEPENDENT_AMBULATORY_CARE_PROVIDER_SITE_OTHER): Payer: Medicare Other | Admitting: Family Medicine

## 2015-05-22 ENCOUNTER — Encounter: Payer: Self-pay | Admitting: Family Medicine

## 2015-05-22 VITALS — BP 102/60 | HR 76 | Temp 98.3°F | Ht 62.0 in | Wt 162.0 lb

## 2015-05-22 DIAGNOSIS — E1142 Type 2 diabetes mellitus with diabetic polyneuropathy: Secondary | ICD-10-CM | POA: Diagnosis not present

## 2015-05-22 DIAGNOSIS — I10 Essential (primary) hypertension: Secondary | ICD-10-CM | POA: Diagnosis not present

## 2015-05-22 DIAGNOSIS — L97511 Non-pressure chronic ulcer of other part of right foot limited to breakdown of skin: Secondary | ICD-10-CM

## 2015-05-22 LAB — POCT GLYCOSYLATED HEMOGLOBIN (HGB A1C): HEMOGLOBIN A1C: 6.1

## 2015-05-22 MED ORDER — METOPROLOL TARTRATE 100 MG PO TABS: 50.0000 mg | ORAL_TABLET | Freq: Two times a day (BID) | ORAL | Status: DC

## 2015-05-22 MED ORDER — HYDROCHLOROTHIAZIDE 25 MG PO TABS: ORAL_TABLET | ORAL | Status: DC

## 2015-05-22 NOTE — Assessment & Plan Note (Signed)
Great control, perhaps over treated.  Will cut back on metoprolol

## 2015-05-22 NOTE — Progress Notes (Signed)
   Subjective:    Patient ID: Becky Fields, female    DOB: [birth year], 69 y.o.   MRN: [id number]  HPI Doing well. Here primarily for recheck of ulcer right great toe.  No redness.  Continues to off load pressure with donut pad. Also, HBP, asymptomatic.  No lightheaded spells.   DM, Home BS running good.  No hypoglycemic spells.  Due for [facility] Completed treatment for chronic hep C.  She has planned FU with ID.   Review of Systems     Objective:   Physical ExamLungs clear Cardiac RRR without m or g Lungs clear Rt great toe - ulcer now flush.  Recurrent thick callous trimmed. Lowish BP noted A1C is nicely at goal!        Assessment & Plan:

## 2015-05-22 NOTE — Assessment & Plan Note (Signed)
Well controled and stable on current meds.

## 2015-05-22 NOTE — Patient Instructions (Signed)
Keep using the donut on your toe. Cut back to 1/2 tab of metoprolol two times per day. I am happy to refill HCTZ and metoprolol at the right dose so that you don't need to cut in half.  Just let me know. See me in early Jan to keep working on the toe. The toe, blood pressure and diabetes all look good.

## 2015-05-22 NOTE — Assessment & Plan Note (Signed)
Admittedly, this has taken quite a long time to heal.  I do believe we are making slow progress.  Continue current Rx.  FU one month.

## 2015-05-23 ENCOUNTER — Ambulatory Visit: Payer: Medicare Other | Admitting: Cardiology

## 2015-05-24 ENCOUNTER — Ambulatory Visit (HOSPITAL_COMMUNITY)
Admission: RE | Admit: 2015-05-24 | Discharge: 2015-05-24 | Disposition: A | Discharge status: Home / Self Care | Payer: Medicare Other | Source: Ambulatory Visit | Attending: Internal Medicine | Admitting: Internal Medicine

## 2015-05-24 DIAGNOSIS — Z9049 Acquired absence of other specified parts of digestive tract: Secondary | ICD-10-CM | POA: Diagnosis not present

## 2015-05-24 DIAGNOSIS — K7460 Unspecified cirrhosis of liver: Secondary | ICD-10-CM | POA: Insufficient documentation

## 2015-06-06 ENCOUNTER — Encounter: Payer: Self-pay | Admitting: Cardiology

## 2015-06-06 ENCOUNTER — Ambulatory Visit (INDEPENDENT_AMBULATORY_CARE_PROVIDER_SITE_OTHER): Payer: Medicare Other | Admitting: Cardiology

## 2015-06-06 VITALS — BP 114/68 | HR 66 | Ht 62.0 in | Wt 163.8 lb

## 2015-06-06 DIAGNOSIS — I10 Essential (primary) hypertension: Secondary | ICD-10-CM | POA: Diagnosis not present

## 2015-06-06 DIAGNOSIS — I340 Nonrheumatic mitral (valve) insufficiency: Secondary | ICD-10-CM

## 2015-06-06 NOTE — Patient Instructions (Addendum)
Medication Instructions:  Your physician recommends that you continue on your current medications as directed. Please refer to the Current Medication list given to you today.   Labwork: None ordered  Testing/Procedures: Your physician has requested that you have an echocardiogram. Echocardiography is a painless test that uses sound waves to create images of your heart. It provides your doctor with information about the size and shape of your heart and how well your heart's chambers and valves are working. This procedure takes approximately one hour. There are no restrictions for this procedure.    Follow-Up: Your physician wants you to follow-up in: [place].  You will receive a reminder letter in the mail two months in advance. If you don't receive a letter, please call our office to schedule the follow-up appointment.    Any Other Special Instructions Will Be Listed Below (If Applicable).  Echocardiogram An echocardiogram, or echocardiography, uses sound waves (ultrasound) to produce an image of your heart. The echocardiogram is simple, painless, obtained within a short period of time, and offers valuable information to your health care provider. The images from an echocardiogram can provide information such as:  Evidence of coronary artery disease (CAD).  Heart size.  Heart muscle function.  Heart valve function.  Aneurysm detection.  Evidence of a past heart attack.  Fluid buildup around the heart.  Heart muscle thickening.  Assess heart valve function. LET [facility] CARE PROVIDER KNOW ABOUT:  Any allergies you have.  All medicines you are taking, including vitamins, herbs, eye drops, creams, and over-the-counter medicines.  Previous problems you or members of your family have had with the use of anesthetics.  Any blood disorders you have.  Previous surgeries you have had.  Medical conditions you have.  Possibility of pregnancy, if this  applies. BEFORE THE PROCEDURE  No special preparation is needed. Eat and drink normally.  PROCEDURE   In order to produce an image of your heart, gel will be applied to your chest and a wand-like tool (transducer) will be moved over your chest. The gel will help transmit the sound waves from the transducer. The sound waves will harmlessly bounce off your heart to allow the heart images to be captured in real-time motion. These images will then be recorded.  You may need an IV to receive a medicine that improves the quality of the pictures. AFTER THE PROCEDURE You may return to your normal schedule including diet, activities, and medicines, unless your health care provider tells you otherwise.   This information is not intended to replace advice given to you by your health care provider. Make sure you discuss any questions you have with your health care provider.   Document Released: 06/05/2000 Document Revised: 06/29/2014 Document Reviewed: 02/13/2013 Elsevier Interactive Patient Education [employer].    If you need a refill on your cardiac medications before your next appointment, please call your pharmacy.

## 2015-06-06 NOTE — Progress Notes (Signed)
06/06/2015 Becky Fields   1946-05-21  [id number]  Primary Physician Becky Gottron, MD Primary Cardiologist: Dr. Ron Fields   Reason for Visit/CC:  1 year f/o for Mitral Regurgitation  HPI:  69 y/o female, followed by Dr. Ron Fields who presents to clinic for routien f/u. She has been seen since her episode of Takot-subo that occurred in April, 2014. She presented with an unusual sensation and some palpitations. She did not have any significant chest pain. Her physiology was interesting. She had very vigorous motion at the base with ballooning of the apex of the left ventricle. With this she had some left ventricular outflow tract obstruction and mitral regurgitation. Follow-up echo eventually showed that her wall motion abnormalities improved. She had only slight residual mitral regurgitation. She has been fully active and not having any significant problems. Her blood pressure has required multiple medications over time. It is well controlled now. She was last seen by Dr. Ron Fields 1 year ago and he recommended 1 year f/u echo to reassess her mitral valve.  Today in clinic, she reports that she has done well. She denies any issues since her last OV. She denies CP, dyspnea, palpitations, syncope, near syncope, LEE, orthopnea or PND. She has been fully compliant with her medications. She notes her PCP follows her DM and HLD. Her DM is well controlled with recent Hgb A1c of 6. Her last lipid panel showed LDL of 29 mg/dL.   Her EKG today shows NSR. BP is well controlled.     Current Outpatient Prescriptions  Medication Sig Dispense Refill  . aspirin 81 MG tablet Take 81 mg by mouth daily.      Marland Kitchen atorvastatin (LIPITOR) 40 MG tablet TAKE ONE TABLET BY MOUTH ONCE DAILY 90 tablet 3  . EQ LORATADINE 10 MG tablet TAKE ONE TABLET BY MOUTH ONCE DAILY AS NEEDED FOR  ALLERGY 90 tablet 3  . fluticasone (FLONASE) 50 MCG/ACT nasal spray Place 2 sprays into both nostrils daily. 16 g 12  . glimepiride (AMARYL) 4 MG  tablet TAKE TWO TABLETS BY MOUTH ONCE DAILY BEFORE BREAKFAST 180 tablet 3  . hydrochlorothiazide (HYDRODIURIL) 25 MG tablet TAKE ONE HALF TABLET BY MOUTH ONCE DAILY 90 tablet 3  . losartan (COZAAR) 100 MG tablet TAKE ONE TABLET BY MOUTH ONCE DAILY 90 tablet 3  . metFORMIN (GLUCOPHAGE) 1000 MG tablet TAKE ONE TABLET BY MOUTH TWICE DAILY 180 tablet 3  . metoprolol (LOPRESSOR) 100 MG tablet Take 0.5 tablets (50 mg total) by mouth 2 (two) times daily. 180 tablet 3  . Multiple Vitamin (MULTIVITAMIN WITH MINERALS) TABS Take 1 tablet by mouth daily.    . nitroGLYCERIN (NITROSTAT) 0.4 MG SL tablet Place 1 tablet (0.4 mg total) under the tongue every 5 (five) minutes x 3 doses as needed for chest pain. 25 tablet 3  . Polyethyl Glycol-Propyl Glycol (SYSTANE OP) Apply 1 drop to eye 2 (two) times daily as needed (dry eyes).    . sitaGLIPtin (JANUVIA) 100 MG tablet Take 1 tablet (100 mg total) by mouth daily. 90 tablet 3  . zolpidem (AMBIEN) 10 MG tablet TAKE ONE TABLET BY MOUTH AT BEDTIME AS NEEDED FOR SLEEP 30 tablet 5   No current facility-administered medications for this visit.    Allergies  Allergen Reactions  . Lisinopril     REACTION: causes cough  . Sulfa Antibiotics     Told as a child allergic    Social History   Social History  . Marital Status: Widowed  Spouse Name: Becky  . Number of Children: 2  . Years of Education: Bachelors   Occupational History  . office assistant    Social History Main Topics  . Smoking status: Never Smoker   . Smokeless tobacco: Never Used  . Alcohol Use: 3.6 oz/week    6 Standard drinks or equivalent per week     Comment: Occasional glass of wine on the weekends  . Drug Use: No  . Sexual Activity: Not Currently   Other Topics Concern  . Not on file   Social History Narrative   Health Care POA:    Emergency Contact: husband, Becky Fields (c) [phone]   End of Life Plan:    Who lives with you: husband, Becky, occasionally son who works out of town     Any pets: 2 dogs, 2 cats   Diet: Pt has a varied diet of protein, starch, vegetables.  Pt is not currently on any kind of diet modifications for DM control.   Exercise: Pt has not regular exercise routine.   Seatbelts: Pt reports wearing seatbelt when in vehicles.    Sun Exposure/Protection: Pt reports not wearing sun protection.   Hobbies: Spending time with granddaughter, reading           Review of Systems: General: negative for chills, fever, night sweats or weight changes.  Cardiovascular: negative for chest pain, dyspnea on exertion, edema, orthopnea, palpitations, paroxysmal nocturnal dyspnea or shortness of breath Dermatological: negative for rash Respiratory: negative for cough or wheezing Urologic: negative for hematuria Abdominal: negative for nausea, vomiting, diarrhea, bright red blood per rectum, melena, or hematemesis Neurologic: negative for visual changes, syncope, or dizziness All other systems reviewed and are otherwise negative except as noted above.    Blood pressure 114/68, pulse 66, height 5\' 2"  (1.575 m), weight 163 lb 12.8 oz (74.299 kg).  General appearance: alert, cooperative and no distress Neck: no carotid bruit and no JVD Lungs: clear to auscultation bilaterally Heart: regular rate and rhythm, S1, S2 normal, no murmur, click, rub or gallop Extremities: no LEE Pulses: 2+ and symmetric Skin: warm and dry Neurologic: Grossly normal  EKG NSR. No ischemia. 66 bpm.   ASSESSMENT AND PLAN:   1. Takotsubo Syndrome: occurred in 2014. Resolved on f/u echo. She denies any recurrent symptoms of CHF.  2. Mitral Regurgitation: Initially she had significant mitral regurgitation with acute Takot-subo. After her LV improved she had residual mild mitral regurgitation. Per Dr. Ron Fields, she is due for f/u echo this year. Will order today to reasess.   3. HTN: BP is well controlled on current regimen. Followed by PCP.  4. HLD: on statin. Followed by PCP.   5. DM:  Controlled on oral agents. Followed by PCP.   PLAN  F/u with MD in 1 year.   Becky Jester PA-C 06/06/2015 10:27 AM

## 2015-06-09 ENCOUNTER — Other Ambulatory Visit: Payer: Self-pay | Admitting: Family Medicine

## 2015-06-10 NOTE — Telephone Encounter (Signed)
Needs refills on metoprolol adn HCTZ.  It needs to be half doses

## 2015-06-17 ENCOUNTER — Ambulatory Visit (INDEPENDENT_AMBULATORY_CARE_PROVIDER_SITE_OTHER): Payer: Medicare Other | Admitting: Family Medicine

## 2015-06-17 VITALS — BP 106/64 | HR 85 | Temp 98.8°F | Resp 16 | Ht 62.0 in | Wt 163.2 lb

## 2015-06-17 DIAGNOSIS — J4520 Mild intermittent asthma, uncomplicated: Secondary | ICD-10-CM

## 2015-06-17 DIAGNOSIS — J683 Other acute and subacute respiratory conditions due to chemicals, gases, fumes and vapors: Secondary | ICD-10-CM

## 2015-06-17 MED ORDER — ALBUTEROL SULFATE 108 (90 BASE) MCG/ACT IN AEPB: 2.0000 | INHALATION_SPRAY | Freq: Four times a day (QID) | RESPIRATORY_TRACT | Status: DC | PRN

## 2015-06-17 MED ORDER — BENZONATATE 200 MG PO CAPS: 200.0000 mg | ORAL_CAPSULE | Freq: Two times a day (BID) | ORAL | Status: DC | PRN

## 2015-06-17 NOTE — Progress Notes (Signed)
Patient ID: Becky Fields MRN: [id number], DOB: 06-Oct-1945, 69 y.o. Date of Encounter: 06/17/2015, 5:20 PM  Primary Physician: Zigmund Gottron, MD  Chief Complaint:  Chief Complaint  Patient presents with  . Cough    x 2 days   . Wheezing    x 2 days    HPI: 69 y.o. year old female presents with a 7 day history of nasal congestion, post nasal drip, sore throat, and cough. Mild sinus pressure. Afebrile. No chills. Nasal congestion thick and green/yellow. Cough is productive of green/yellow sputum and not associated with time of day. Ears feel full, leading to sensation of muffled hearing. Has tried OTC cold preps without success. No GI complaints.   No sick contacts, recent antibiotics, or recent travels.   No leg trauma, sedentary periods, h/o cancer, or tobacco use.  Past Medical History  Diagnosis Date  . Diabetes mellitus   . Hypertension   . Cataract   . Hepatitis C   . Hypertriglyceridemia   . Takotsubo syndrome     October 16, 2012  . Ejection fraction     .     Home Meds: Prior to Admission medications   Medication Sig Start Date End Date Taking? Authorizing Provider  aspirin 81 MG tablet Take 81 mg by mouth daily.     Yes Historical Provider, MD  atorvastatin (LIPITOR) 40 MG tablet TAKE ONE TABLET BY MOUTH ONCE DAILY 01/22/15  Yes Zenia Resides, MD  EQ LORATADINE 10 MG tablet TAKE ONE TABLET BY MOUTH ONCE DAILY AS NEEDED FOR  ALLERGY 02/05/15  Yes Zenia Resides, MD  fluticasone (FLONASE) 50 MCG/ACT nasal spray Place 2 sprays into both nostrils daily. 11/06/13  Yes Zenia Resides, MD  glimepiride (AMARYL) 4 MG tablet TAKE TWO TABLETS BY MOUTH ONCE DAILY BEFORE BREAKFAST 01/22/15  Yes Zenia Resides, MD  hydrochlorothiazide (HYDRODIURIL) 25 MG tablet TAKE ONE HALF TABLET BY MOUTH ONCE DAILY 05/22/15  Yes Zenia Resides, MD  losartan (COZAAR) 100 MG tablet TAKE ONE TABLET BY MOUTH ONCE DAILY 09/13/14  Yes Zenia Resides, MD  metFORMIN (GLUCOPHAGE) 1000 MG  tablet TAKE ONE TABLET BY MOUTH TWICE DAILY 11/08/14  Yes Zenia Resides, MD  metoprolol (LOPRESSOR) 50 MG tablet TAKE ONE TABLET BY MOUTH TWICE DAILY 06/10/15  Yes Zenia Resides, MD  Multiple Vitamin (MULTIVITAMIN WITH MINERALS) TABS Take 1 tablet by mouth daily.   Yes Historical Provider, MD  nitroGLYCERIN (NITROSTAT) 0.4 MG SL tablet Place 1 tablet (0.4 mg total) under the tongue every 5 (five) minutes x 3 doses as needed for chest pain. 12/25/13  Yes Zenia Resides, MD  Polyethyl Glycol-Propyl Glycol (SYSTANE OP) Apply 1 drop to eye 2 (two) times daily as needed (dry eyes).   Yes Historical Provider, MD  sitaGLIPtin (JANUVIA) 100 MG tablet Take 1 tablet (100 mg total) by mouth daily. 09/12/14  Yes Zenia Resides, MD  zolpidem (AMBIEN) 10 MG tablet TAKE ONE TABLET BY MOUTH AT BEDTIME AS NEEDED FOR SLEEP 11/21/13  Yes Zenia Resides, MD    Allergies:  Allergies  Allergen Reactions  . Lisinopril     REACTION: causes cough  . Sulfa Antibiotics     Told as a child allergic    Social History   Social History  . Marital Status: Widowed    Spouse Name: Ron  . Number of Children: 2  . Years of Education: Bachelors   Occupational History  . office assistant  Social History Main Topics  . Smoking status: Never Smoker   . Smokeless tobacco: Never Used  . Alcohol Use: 3.6 oz/week    6 Standard drinks or equivalent per week     Comment: Occasional glass of wine on the weekends  . Drug Use: No  . Sexual Activity: Not Currently   Other Topics Concern  . Not on file   Social History Narrative   Health Care POA:    Emergency Contact: husband, Yeila Licano (c) [phone]   End of Life Plan:    Who lives with you: husband, Ron, occasionally son who works out of town   Any pets: 2 dogs, 2 cats   Diet: Pt has a varied diet of protein, starch, vegetables.  Pt is not currently on any kind of diet modifications for DM control.   Exercise: Pt has not regular exercise routine.    Seatbelts: Pt reports wearing seatbelt when in vehicles.    Sun Exposure/Protection: Pt reports not wearing sun protection.   Hobbies: Spending time with granddaughter, reading           Review of Systems: Constitutional: negative for chills, fever, night sweats or weight changes Cardiovascular: negative for chest pain or palpitations Respiratory: negative for hemoptysis, wheezing, or shortness of breath Abdominal: negative for abdominal pain, nausea, vomiting or diarrhea Dermatological: negative for rash Neurologic: negative for headache   Physical Exam: Blood pressure 106/64, pulse 85, temperature 98.8 F (37.1 C), temperature source Oral, resp. rate 16, height 5\' 2"  (1.575 m), weight 163 lb 3.2 oz (74.027 kg), SpO2 96 %., Body mass index is 29.84 kg/(m^2). General: Well developed, well nourished, in no acute distress. Head: Normocephalic, atraumatic, eyes without discharge, sclera non-icteric, nares are congested. Bilateral auditory canals clear, TM's are without perforation, pearly grey with reflective cone of light bilaterally. No sinus TTP. Oral cavity moist, dentition normal. Posterior pharynx with post nasal drip and mild erythema. No peritonsillar abscess or tonsillar exudate. Neck: Supple. No thyromegaly. Full ROM. No lymphadenopathy. Lungs: Coarse breath sounds bilaterally with wheezes, but no rales or rhonchi. Breathing is unlabored.  Heart: RRR with S1 S2. No murmurs, rubs, or gallops appreciated. Msk:  Strength and tone normal for age. Extremities: No clubbing or cyanosis. No edema. Neuro: Alert and oriented X 3. Moves all extremities spontaneously. CNII-XII grossly in tact. Psych:  Responds to questions appropriately with a normal affect.     ASSESSMENT AND PLAN:  69 y.o. year old female with bronchitis. This chart was scribed in my presence and reviewed by me personally.    ICD-9-CM ICD-10-CM   1. Reactive airways dysfunction syndrome, mild intermittent,  uncomplicated 123456 [id number] benzonatate (TESSALON) 200 MG capsule     Albuterol Sulfate (PROAIR RESPICLICK) 123XX123 (90 BASE) MCG/ACT AEPB    - No diagnosis found. -Tylenol/Motrin prn -Rest/fluids -RTC precautions -RTC 3-5 days if no improvement  Signed, Robyn Haber, MD 06/17/2015 5:20 PM

## 2015-06-21 ENCOUNTER — Ambulatory Visit (HOSPITAL_COMMUNITY): Payer: Medicare Other

## 2015-06-26 ENCOUNTER — Encounter: Payer: Self-pay | Admitting: Family Medicine

## 2015-06-26 ENCOUNTER — Ambulatory Visit (INDEPENDENT_AMBULATORY_CARE_PROVIDER_SITE_OTHER): Payer: Medicare Other | Admitting: Family Medicine

## 2015-06-26 VITALS — BP 130/68 | HR 79 | Temp 98.1°F | Ht 62.0 in | Wt 162.0 lb

## 2015-06-26 DIAGNOSIS — L97511 Non-pressure chronic ulcer of other part of right foot limited to breakdown of skin: Secondary | ICD-10-CM

## 2015-06-26 NOTE — Patient Instructions (Signed)
Looking good. See me in one month. Reschedule your colonoscopy.

## 2015-06-26 NOTE — Assessment & Plan Note (Signed)
Nearly healed.  Recheck one month.

## 2015-06-26 NOTE — Progress Notes (Signed)
   Subjective:    Patient ID: Becky Fields, female    DOB: [birth year], 70 y.o.   MRN: [id number]  HPI To trim ulcer dorsum of right great toe.  Chronic Only HPDP need is colonoscopy.  She will schedule. Seen by cards - they ordered echo ID ordered ultrasound which by my read has unchanged cirrhosis.    Review of Systems     Objective:   Physical Exam Callous again trimmed on Right great toe.  Ulcer looks almost completely reepithelialized.         Assessment & Plan:

## 2015-07-04 ENCOUNTER — Other Ambulatory Visit: Payer: Self-pay

## 2015-07-04 ENCOUNTER — Ambulatory Visit (HOSPITAL_COMMUNITY): Payer: Medicare Other | Attending: Cardiology

## 2015-07-04 DIAGNOSIS — E119 Type 2 diabetes mellitus without complications: Secondary | ICD-10-CM | POA: Insufficient documentation

## 2015-07-04 DIAGNOSIS — I517 Cardiomegaly: Secondary | ICD-10-CM | POA: Diagnosis not present

## 2015-07-04 DIAGNOSIS — I071 Rheumatic tricuspid insufficiency: Secondary | ICD-10-CM | POA: Insufficient documentation

## 2015-07-04 DIAGNOSIS — I253 Aneurysm of heart: Secondary | ICD-10-CM | POA: Diagnosis not present

## 2015-07-04 DIAGNOSIS — I340 Nonrheumatic mitral (valve) insufficiency: Secondary | ICD-10-CM | POA: Insufficient documentation

## 2015-07-04 DIAGNOSIS — I5189 Other ill-defined heart diseases: Secondary | ICD-10-CM | POA: Insufficient documentation

## 2015-07-04 DIAGNOSIS — I059 Rheumatic mitral valve disease, unspecified: Secondary | ICD-10-CM | POA: Diagnosis not present

## 2015-07-04 DIAGNOSIS — I10 Essential (primary) hypertension: Secondary | ICD-10-CM | POA: Diagnosis not present

## 2015-07-05 ENCOUNTER — Telehealth: Payer: Self-pay | Admitting: *Deleted

## 2015-07-05 NOTE — Telephone Encounter (Signed)
-----   Message from Consuelo Pandy, [place] sent at 07/05/2015  4:33 PM EST ----- Ultrasound of heart looks ok.  Pump function is normal. The mitral valve is a little leaky but this only mild. No need for any treatment for this now. We can follow-up on this further down the road with repeat echo if needed.

## 2015-07-05 NOTE — Telephone Encounter (Signed)
Per Ellen Henri, PA-C, called pt and made here aware that her ECHO was normal.  Pt verbalized understanding.

## 2015-07-09 ENCOUNTER — Encounter: Payer: Self-pay | Admitting: Internal Medicine

## 2015-08-01 ENCOUNTER — Ambulatory Visit (INDEPENDENT_AMBULATORY_CARE_PROVIDER_SITE_OTHER): Payer: Medicare Other | Admitting: Family Medicine

## 2015-08-01 ENCOUNTER — Encounter: Payer: Self-pay | Admitting: Family Medicine

## 2015-08-01 VITALS — BP 133/64 | HR 69 | Temp 97.9°F | Ht 62.0 in | Wt 162.4 lb

## 2015-08-01 DIAGNOSIS — E1142 Type 2 diabetes mellitus with diabetic polyneuropathy: Secondary | ICD-10-CM

## 2015-08-01 DIAGNOSIS — L97511 Non-pressure chronic ulcer of other part of right foot limited to breakdown of skin: Secondary | ICD-10-CM

## 2015-08-01 DIAGNOSIS — B372 Candidiasis of skin and nail: Secondary | ICD-10-CM | POA: Diagnosis not present

## 2015-08-01 DIAGNOSIS — I10 Essential (primary) hypertension: Secondary | ICD-10-CM

## 2015-08-01 LAB — POCT GLYCOSYLATED HEMOGLOBIN (HGB A1C): Hemoglobin A1C: 6.2

## 2015-08-01 MED ORDER — FLUCONAZOLE 150 MG PO TABS: 150.0000 mg | ORAL_TABLET | Freq: Once | ORAL | Status: DC

## 2015-08-01 NOTE — Patient Instructions (Addendum)
I am very pleased with everything. Your A1C is great. Your steady weight loss is great. See sports medicine to evaluate for inserts to prevent the next problem See me in 2 months, sooner if the callous is building up too quickly. You want to have an antifungal cream or ointment.  Desitin, monastat.  Also as a preventive, baby powder/corn starch used daily to stay dry.

## 2015-08-02 NOTE — Assessment & Plan Note (Signed)
Neuropathic callous and ulcer will likely reform unless we change mechanics.  Refer to [facility] for possible inserts.

## 2015-08-02 NOTE — Progress Notes (Signed)
   Subjective:    Patient ID: Becky Fields, female    DOB: [birth year], 70 y.o.   MRN: [id number]  HPI Several issues: Right great toe ulcer/callous.  Doing well per patient, callous has reformed. Dm - due for A1C check.  Becky Fields is nicely at goal.   Due for colonoscopy, has appointment for next month. Recheck HBP.  Tolerating meds well Rash under groin skin fold.  Becky Fields has had in the past. No other complaints.    Review of Systems     Objective:   Physical Exam Toe has callous with some hemorrage into callous.  Trimmed and none of hemorrhage is into sub cu.  No true ulcer at this point. Normal BP noted. Intertrigo of groin.        Assessment & Plan:

## 2015-08-02 NOTE — Assessment & Plan Note (Signed)
Great control of DM.  Neuropathic callous and ulcer will likely reform unless we change mechanics.  Refer to [facility] for possible inserts.

## 2015-08-02 NOTE — Assessment & Plan Note (Signed)
Good control on current meds. 

## 2015-08-02 NOTE — Assessment & Plan Note (Signed)
Responded well to diflucan in past.  Discussed topical and preventive measures.

## 2015-08-12 ENCOUNTER — Encounter: Payer: Self-pay | Admitting: Sports Medicine

## 2015-08-12 ENCOUNTER — Ambulatory Visit (INDEPENDENT_AMBULATORY_CARE_PROVIDER_SITE_OTHER): Payer: Medicare Other | Admitting: Sports Medicine

## 2015-08-12 VITALS — BP 126/70 | HR 69 | Ht 62.0 in | Wt 162.0 lb

## 2015-08-12 DIAGNOSIS — L97511 Non-pressure chronic ulcer of other part of right foot limited to breakdown of skin: Secondary | ICD-10-CM | POA: Diagnosis not present

## 2015-08-12 NOTE — Progress Notes (Signed)
  Becky Fields - 70 y.o. female MRN [id number]  Date of birth: 1946-05-04  SUBJECTIVE:  Including CC & ROS.  No chief complaint on file. CC: diabetic ulcer on R great toe  HPI: Becky Fields is a 70 YO woman with a PMH of DM2 and HTN who presents today with a healing diabetic ulcer of her R great toe. Ulcer has been present for >1 year and has been slowly healing since that time. Ulcer believed to be caused by increased friction/pressure over the great toe - denies specific injury. Currently, ulcer has nearly completely resolved. Patient has very little sensation in her feet bilaterally. Has fallen multiple times over the past year because she loses her balance. Does not consistently wear closed toed or diabetic shoes. Prefers to wear slip on shoes or socks. Is on her feet a lot at work where she stands on concrete floors.    HISTORY: Past Medical, Surgical, Social, and Family History Reviewed & Updated per EMR.   Pertinent Historical Findings include: PMH of DM2 and HTN - both now well-controlled  DATA REVIEWED: None.  PHYSICAL EXAM:  VS: BP:126/70 mmHg  HR:69bpm  TEMP: ( )  RESP:   HT:5\' 2"  (157.5 cm)   WT:162 lb (73.483 kg)  BMI:29.7 PHYSICAL EXAM: Gen: Well-appearing Caucasian woman, NAD HEENT: normocephalic, atraumatic, EOMI Pulm: non-labored breathing  R foot: Healed R great toe ulcer on plantar aspect - minimal residual hematoma visible. Skin intact.  Mild soft tissue swelling of the ankle. Moderate hallux valgus noted. Pes planus. Full ROM with dorsiflexion, plantarflexion, inversion, and eversion. No TTP over the R great toe, medial or lateral malleolus, Achilles, or calcaneous. 5/5 strength with both dorsiflexion and plantarflexion Unable to sense light touch until the lower third of the tibia  L foot: Skin intact, significant callus formation over heel. Mild soft tissue swelling of the ankle. No erythema. Moderate hallux valgus noted. Pes planus. Full ROM with dorsiflexion,  plantarflexion, inversion, and eversion. No TTP over forefoot, midfoot, medial or lateral malleolus, Achilles, or calcaneous. 5/5 strength with both dorsiflexion and plantarflexion Unable to sense light touch until the lower third of the tibia  ASSESSMENT & PLAN: See problem based charting & AVS for pt instructions. 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 Discussed with patient the potential risks of custom orthotics or inserts with metatarsal pads, which could contribute to blister formation because of her significant peripheral neuropathy. Encouraged patient to consistently wear shoes that protect her feet and to be diligent with daily foot checks. - Provided green inserts with additional cushioning over the great toes bilaterally - Patient will follow up with PCP Dr. Andria Frames in 1 month, can follow up with sports medicine PRN  *Note dictated by Darcus Austin, MSIV under my direction. We are going to try a simple green sports insole with some additional padding under the first ray. I am concerned that metatarsal pads or scaphoid pads, and certainly custom orthotics, may place pressure along other areas of her foot potentially causing skin breakdown and other areas.

## 2015-08-26 ENCOUNTER — Ambulatory Visit: Payer: Medicare Other | Admitting: Internal Medicine

## 2015-09-05 ENCOUNTER — Ambulatory Visit (AMBULATORY_SURGERY_CENTER): Payer: Self-pay | Admitting: *Deleted

## 2015-09-05 VITALS — Ht 62.0 in | Wt 164.8 lb

## 2015-09-05 DIAGNOSIS — Z86010 Personal history of colonic polyps: Secondary | ICD-10-CM

## 2015-09-05 NOTE — Progress Notes (Signed)
Denies allergies to eggs or soy products. Denies complications with sedation or anesthesia. Denies O2 use. Denies use of diet or weight loss medications.  Emmi instructions not given for colonoscopy, pt does not have computer access.

## 2015-09-18 ENCOUNTER — Other Ambulatory Visit: Payer: Self-pay | Admitting: Family Medicine

## 2015-09-19 ENCOUNTER — Encounter: Payer: Self-pay | Admitting: Internal Medicine

## 2015-09-19 ENCOUNTER — Ambulatory Visit (AMBULATORY_SURGERY_CENTER): Payer: Medicare Other | Admitting: Internal Medicine

## 2015-09-19 VITALS — BP 118/58 | HR 65 | Temp 98.7°F | Resp 12 | Ht 62.0 in | Wt 164.0 lb

## 2015-09-19 DIAGNOSIS — Z86010 Personal history of colonic polyps: Secondary | ICD-10-CM | POA: Diagnosis not present

## 2015-09-19 DIAGNOSIS — Z1211 Encounter for screening for malignant neoplasm of colon: Secondary | ICD-10-CM | POA: Diagnosis not present

## 2015-09-19 LAB — GLUCOSE, CAPILLARY
GLUCOSE-CAPILLARY: 114 mg/dL — AB (ref 65–99)
GLUCOSE-CAPILLARY: 88 mg/dL (ref 65–99)

## 2015-09-19 MED ORDER — SODIUM CHLORIDE 0.9 % IV SOLN: 500.0000 mL | INTRAVENOUS | Status: DC

## 2015-09-19 NOTE — Patient Instructions (Addendum)
No polyps today so next routine colonoscopy in 10 years - 2027  I appreciate the opportunity to care for you. Gatha Mayer, MD, FACG  YOU HAD AN ENDOSCOPIC PROCEDURE TODAY AT [facility]:   Refer to the procedure report that was given to you for any specific questions about what was found during the examination.  If the procedure report does not answer your questions, please call your gastroenterologist to clarify.  If you requested that your care partner not be given the details of your procedure findings, then the procedure report has been included in a sealed envelope for you to review at your convenience later.  YOU SHOULD EXPECT: Some feelings of bloating in the abdomen. Passage of more gas than usual.  Walking can help get rid of the air that was put into your GI tract during the procedure and reduce the bloating. If you had a lower endoscopy (such as a colonoscopy or flexible sigmoidoscopy) you may notice spotting of blood in your stool or on the toilet paper. If you underwent a bowel prep for your procedure, you may not have a normal bowel movement for a few days.  Please Note:  You might notice some irritation and congestion in your nose or some drainage.  This is from the oxygen used during your procedure.  There is no need for concern and it should clear up in a day or so.  SYMPTOMS TO REPORT IMMEDIATELY:   Following lower endoscopy (colonoscopy or flexible sigmoidoscopy):  Excessive amounts of blood in the stool  Significant tenderness or worsening of abdominal pains  Swelling of the abdomen that is new, acute  Fever of 100F or higher  For urgent or emergent issues, a gastroenterologist can be reached at any hour by calling [phone].   DIET: Your first meal following the procedure should be a small meal and then it is ok to progress to your normal diet. Heavy or fried foods are harder to digest and may make you feel nauseous or bloated.   Likewise, meals heavy in dairy and vegetables can increase bloating.  Drink plenty of fluids but you should avoid alcoholic beverages for 24 hours.  ACTIVITY:  You should plan to take it easy for the rest of today and you should NOT DRIVE or use heavy machinery until tomorrow (because of the sedation medicines used during the test).    FOLLOW UP: Our staff will call the number listed on your records the next business day following your procedure to check on you and address any questions or concerns that you may have regarding the information given to you following your procedure. If we do not reach you, we will leave a message.  However, if you are feeling well and you are not experiencing any problems, there is no need to return our call.  We will assume that you have returned to your regular daily activities without incident.  If any biopsies were taken you will be contacted by phone or by letter within the next 1-3 weeks.  Please call us at [phone] if you have not heard about the biopsies in 3 weeks.    SIGNATURES/CONFIDENTIALITY: You and/or your care partner have signed paperwork which will be entered into your electronic medical record.  These signatures attest to the fact that that the information above on your After Visit Summary has been reviewed and is understood.  Full responsibility of the confidentiality of this discharge information lies with you and/or  your care-partner. 

## 2015-09-19 NOTE — Progress Notes (Signed)
A and O x 3 Repot to RN.

## 2015-09-19 NOTE — Op Note (Signed)
[place] Patient Name: Becky Fields Procedure Date: 09/19/2015 9:16 AM MRN: [id number] Endoscopist: Gatha Mayer , MD Age: 70 Referring MD:  Date of Birth: 1945-07-17 Gender: Female Procedure:                Colonoscopy Indications:              Surveillance: Personal history of adenomatous                            polyps on last colonoscopy > 5 years ago Medicines:                Propofol total dose 250 mg IV, Monitored Anesthesia                            Care Procedure:                Pre-Anesthesia Assessment:                           - Prior to the procedure, a History and Physical                            was performed, and patient medications and                            allergies were reviewed. The patient's tolerance of                            previous anesthesia was also reviewed. The risks                            and benefits of the procedure and the sedation                            options and risks were discussed with the patient.                            All questions were answered, and informed consent                            was obtained. Prior Anticoagulants: The patient has                            taken no previous anticoagulant or antiplatelet                            agents. ASA Grade Assessment: II - A patient with                            mild systemic disease. After reviewing the risks                            and benefits, the patient was deemed in  satisfactory condition to undergo the procedure.                           After obtaining informed consent, the colonoscope                            was passed under direct vision. Throughout the                            procedure, the patient's blood pressure, pulse, and                            oxygen saturations were monitored continuously. The                            Model CF-HQ190L [id number]) scope was introduced              through the anus and advanced to the the cecum,                            identified by appendiceal orifice and ileocecal                            valve. The colonoscopy was performed without                            difficulty. The patient tolerated the procedure                            well. The quality of the bowel preparation was                            excellent. The bowel preparation used was Miralax.                            The ileocecal valve, appendiceal orifice, and                            rectum were photographed. Scope In: 9:29:54 AM Scope Out: 9:42:17 AM Scope Withdrawal Time: 0 hours 9 minutes 0 seconds  Total Procedure Duration: 0 hours 12 minutes 23 seconds  Findings:      The perianal and digital rectal examinations were normal.      Diverticula were found in the sigmoid colon.      The exam was otherwise without abnormality on direct and retroflexion       views. Complications:            No immediate complications. Estimated Blood Loss:     Estimated blood loss: none. Impression:               - Mild diverticulosis in the sigmoid colon.                           - The examination was otherwise normal on direct  and retroflexion views.                           - No specimens collected. Recommendation:           - Patient has a contact number available for                            emergencies. The signs and symptoms of potential                            delayed complications were discussed with the                            patient. Return to normal activities tomorrow.                            Written discharge instructions were provided to the                            patient.                           - Resume previous diet.                           - Continue present medications.                           - Repeat colonoscopy in 10 years for surveillance.                            Hx 2 small  adenomas 2003 - none since Procedure Code(s):        --- Professional ---                           G0105, Colorectal cancer screening; colonoscopy on                            individual at high risk CPT copyright 2016 [employer]. All rights reserved. Gatha Mayer, MD 09/19/2015 9:52:49 AM This report has been signed electronically. Number of Addenda: 0 CC Letter to:             William A. Hensel Referring MD:      Jamal Collin. Hensel

## 2015-09-19 NOTE — Progress Notes (Signed)
Patient denies any allergies to eggs or soy. 

## 2015-09-20 ENCOUNTER — Telehealth: Payer: Self-pay

## 2015-09-20 NOTE — Telephone Encounter (Signed)
  Follow up Call-  Call back number 09/19/2015  Post procedure Call Back phone  # [phone]  Permission to leave phone message Yes    Patient was called for follow up after procedure on 09/19/2015. No answer at the number given for follow up phone call. I was not able to leave a message on voice mail.

## 2015-09-23 ENCOUNTER — Encounter: Payer: Self-pay | Admitting: Family Medicine

## 2015-09-23 DIAGNOSIS — Z86010 Personal history of colonic polyps: Secondary | ICD-10-CM

## 2015-09-25 ENCOUNTER — Other Ambulatory Visit: Payer: Self-pay | Admitting: Family Medicine

## 2015-10-10 ENCOUNTER — Encounter: Payer: Self-pay | Admitting: Family Medicine

## 2015-10-10 ENCOUNTER — Ambulatory Visit (INDEPENDENT_AMBULATORY_CARE_PROVIDER_SITE_OTHER): Payer: Medicare Other | Admitting: Family Medicine

## 2015-10-10 VITALS — BP 122/61 | HR 73 | Temp 97.9°F | Ht 62.0 in | Wt 161.9 lb

## 2015-10-10 DIAGNOSIS — I10 Essential (primary) hypertension: Secondary | ICD-10-CM | POA: Diagnosis not present

## 2015-10-10 DIAGNOSIS — E781 Pure hyperglyceridemia: Secondary | ICD-10-CM

## 2015-10-10 DIAGNOSIS — E1142 Type 2 diabetes mellitus with diabetic polyneuropathy: Secondary | ICD-10-CM | POA: Diagnosis not present

## 2015-10-10 MED ORDER — METFORMIN HCL 1000 MG PO TABS
500.0000 mg | ORAL_TABLET | Freq: Two times a day (BID) | ORAL | Status: DC
Start: 2015-10-10 — End: 2015-11-26

## 2015-10-10 MED ORDER — HYDROCHLOROTHIAZIDE 12.5 MG PO TABS: 12.5000 mg | ORAL_TABLET | Freq: Every day | ORAL | Status: DC

## 2015-10-10 NOTE — Patient Instructions (Addendum)
Stop your atorvastatin/lipitor/cholesterol lowering medication. Plan on coming back in 3 months fasting for a cholesterol recheck and A1C - soon if the toe callous is building up. I sent in a new prescription for the HCTZ OK to take 1/2 tab of metformin twice a day.  I hope it helps with the loose stools.

## 2015-10-11 NOTE — Assessment & Plan Note (Signed)
I think we are over treating.  Stop atorvastin.  Lipid panel in three months.  Decide on further statin treatment based on those numbers.  With weight loss and better diabetic control, I think the triglyceride issue is likely much better.

## 2015-10-11 NOTE — Assessment & Plan Note (Signed)
Decrease metformin due to side effects.  Ulcer cured.  Callous build up slower with inserts.  Will need ongoing observation.

## 2015-10-11 NOTE — Progress Notes (Signed)
   Subjective:    Patient ID: Becky Fields, female    DOB: 20-Aug-1945, 70 y.o.   MRN: [id number]  HPI Several issues: 1. FU neuropathic callous of toe.  Got inserts at [facility].  Seems to be doing better.  Less callous build up.  She says no ulcer. 2. DM control has been great, likely due to wt loss.  Having some diarrhea on the metformin.  Would like to try a lower dose.  Not due for A1C yet. 3. Cholesterol is due.  Looking at last lipid panel, her cholesterol was ridiculously low on atorvastatin.  She had a normal cath so this is primary prevention in a diabetic.  We discussed issue of overtreatment.    Review of Systems     Objective:   Physical Exam Lungs clear Cardiac rrr without m or g Toe shows callous build up, but much less so than previously.  Shaved callous.        Assessment & Plan:

## 2015-11-14 DIAGNOSIS — I10 Essential (primary) hypertension: Secondary | ICD-10-CM | POA: Diagnosis not present

## 2015-11-14 DIAGNOSIS — H5203 Hypermetropia, bilateral: Secondary | ICD-10-CM | POA: Diagnosis not present

## 2015-11-14 DIAGNOSIS — E119 Type 2 diabetes mellitus without complications: Secondary | ICD-10-CM | POA: Diagnosis not present

## 2015-11-14 DIAGNOSIS — H35033 Hypertensive retinopathy, bilateral: Secondary | ICD-10-CM | POA: Diagnosis not present

## 2015-11-14 DIAGNOSIS — Z7984 Long term (current) use of oral hypoglycemic drugs: Secondary | ICD-10-CM | POA: Diagnosis not present

## 2015-11-14 DIAGNOSIS — Z961 Presence of intraocular lens: Secondary | ICD-10-CM | POA: Diagnosis not present

## 2015-11-14 DIAGNOSIS — H52223 Regular astigmatism, bilateral: Secondary | ICD-10-CM | POA: Diagnosis not present

## 2015-11-14 LAB — HM DIABETES EYE EXAM

## 2015-11-26 ENCOUNTER — Other Ambulatory Visit: Payer: Self-pay | Admitting: Family Medicine

## 2015-11-26 DIAGNOSIS — E1142 Type 2 diabetes mellitus with diabetic polyneuropathy: Secondary | ICD-10-CM

## 2015-11-26 MED ORDER — METFORMIN HCL 1000 MG PO TABS: 500.0000 mg | ORAL_TABLET | Freq: Two times a day (BID) | ORAL | Status: DC

## 2015-11-26 NOTE — Telephone Encounter (Signed)
Needs refill on metformin.  She is currently taking 500 mg twice a day.  [facility] on [address]

## 2015-12-17 ENCOUNTER — Encounter: Payer: Self-pay | Admitting: Internal Medicine

## 2015-12-17 ENCOUNTER — Ambulatory Visit (INDEPENDENT_AMBULATORY_CARE_PROVIDER_SITE_OTHER): Payer: Medicare Other | Admitting: Internal Medicine

## 2015-12-17 VITALS — BP 121/76 | HR 80 | Temp 98.0°F | Ht 62.0 in | Wt 168.0 lb

## 2015-12-17 DIAGNOSIS — K7460 Unspecified cirrhosis of liver: Secondary | ICD-10-CM | POA: Diagnosis not present

## 2015-12-17 DIAGNOSIS — B182 Chronic viral hepatitis C: Secondary | ICD-10-CM | POA: Diagnosis not present

## 2015-12-17 NOTE — Progress Notes (Signed)
   Subjective:    Patient ID: Becky Fields, female    DOB: 03/31/1946, 70 y.o.   MRN: [id number]  HPI Here for follow up of HCV.   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   Now has completed treatment. End of treatment viral response is undetectable.  Now SVR12 is undetectable which confers a 99.6%  No new issues.  Pleased to have finished.  SVR 24 also undetectable so now officially considered cured.  Ultrasound of abd for [facility] screen without masses 6 months ago.   Has been to GI.     Review of Systems  Constitutional: Negative for fever and fatigue.  Gastrointestinal: Negative for nausea and diarrhea.  Skin: Negative for rash.  Neurological: Negative for dizziness and headaches.       Objective:   Physical Exam  Constitutional: She appears well-developed and well-nourished. No distress.  Eyes: No scleral icterus.  Cardiovascular: Normal rate, regular rhythm and normal heart sounds.   No murmur heard. Pulmonary/Chest: Breath sounds normal. No respiratory distress.  Lymphadenopathy:    She has no cervical adenopathy.  Skin: No rash noted.          Assessment & Plan:

## 2015-12-17 NOTE — Assessment & Plan Note (Signed)
Now considered cured.  

## 2015-12-17 NOTE — Assessment & Plan Note (Signed)
[place] screening and rtc 6 months.

## 2015-12-26 ENCOUNTER — Ambulatory Visit
Admission: RE | Admit: 2015-12-26 | Discharge: 2015-12-26 | Disposition: A | Discharge status: Home / Self Care | Payer: Medicare Other | Source: Ambulatory Visit | Attending: Internal Medicine | Admitting: Internal Medicine

## 2015-12-26 DIAGNOSIS — K7460 Unspecified cirrhosis of liver: Secondary | ICD-10-CM

## 2016-01-09 ENCOUNTER — Ambulatory Visit (INDEPENDENT_AMBULATORY_CARE_PROVIDER_SITE_OTHER): Payer: Medicare Other | Admitting: Family Medicine

## 2016-01-09 ENCOUNTER — Encounter: Payer: Self-pay | Admitting: Family Medicine

## 2016-01-09 VITALS — BP 122/67 | HR 71 | Temp 98.1°F | Ht 62.0 in | Wt 164.2 lb

## 2016-01-09 DIAGNOSIS — E1142 Type 2 diabetes mellitus with diabetic polyneuropathy: Secondary | ICD-10-CM

## 2016-01-09 LAB — POCT GLYCOSYLATED HEMOGLOBIN (HGB A1C): HEMOGLOBIN A1C: 6

## 2016-01-09 MED ORDER — METFORMIN HCL 500 MG PO TABS: 500.0000 mg | ORAL_TABLET | Freq: Two times a day (BID) | ORAL | Status: DC

## 2016-01-09 NOTE — Patient Instructions (Signed)
Your foot looks great.  Keep up the good work The diabetes control is great.  Stay on the lower dose of metformen.  I sent a new prescription to [facility] [place] a mammogram You will need a flu shot in the fall See me for a recheck in 4-6 months. Enjoy that grandbaby.

## 2016-01-09 NOTE — Assessment & Plan Note (Signed)
Great control and ulcer healed.  No change.  FU 6 months.Needs flu shot with me in fall.

## 2016-01-09 NOTE — Progress Notes (Signed)
   Subjective:    Patient ID: Becky Fields, female    DOB: 1946-06-20, 70 y.o.   MRN: [id number]  HPI Doing well, here for DM recheck.  No hypoglycemic sx.  A1C is great/ Also, recheck foot.  Seems healed per her.  Still callous.  She has been gently grinding callous with skin drill Due for mammogram.  Discussed why she still needs.     Review of Systems     Objective:   Physical ExamFoot, callous trimmed and yes, the ulcer is finally and completely healed.        Assessment & Plan:

## 2016-02-03 ENCOUNTER — Other Ambulatory Visit: Payer: Self-pay | Admitting: Family Medicine

## 2016-03-06 IMAGING — US US ABDOMEN COMPLETE
1 series · 14 of 25 positions shown · non-contrast
Comparison: 11/16/2014.

CLINICAL DATA: Cirrhosis.  Hepatoma screening.

EXAM:
ULTRASOUND ABDOMEN COMPLETE

[Series 1: us abdomen complete · 0.22mm/px · 14 of 71 slices shown]
[im 1/71]
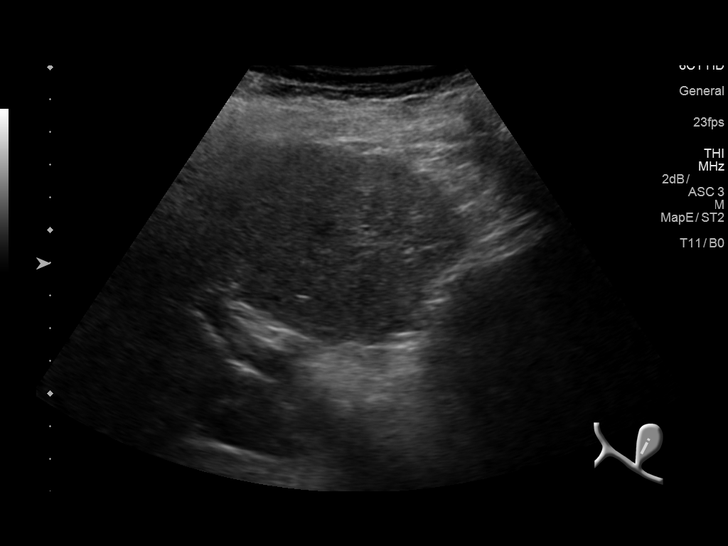
[im 6/71]
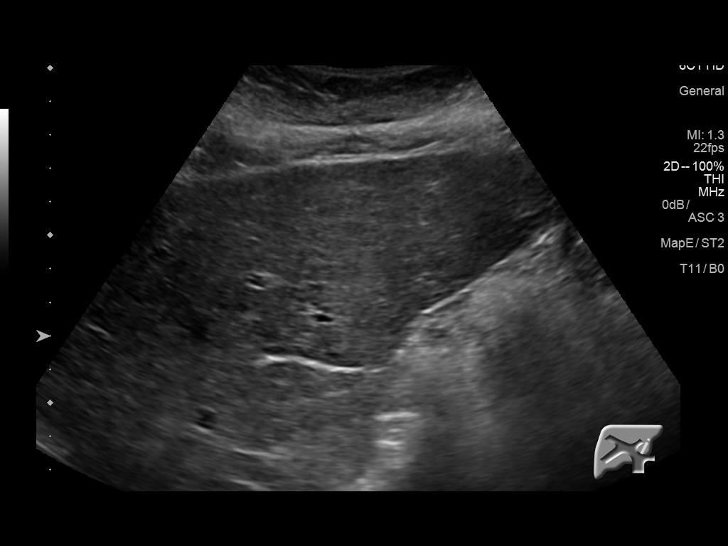
[im 12/71]
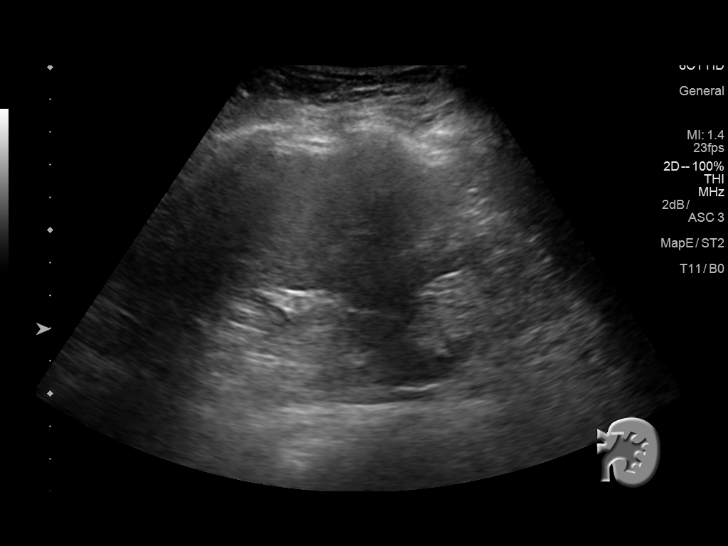
[im 18/71]
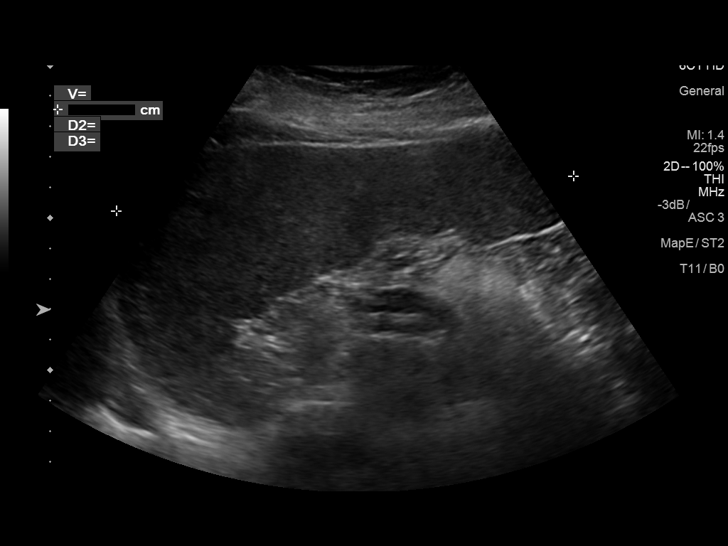
[im 24/71]
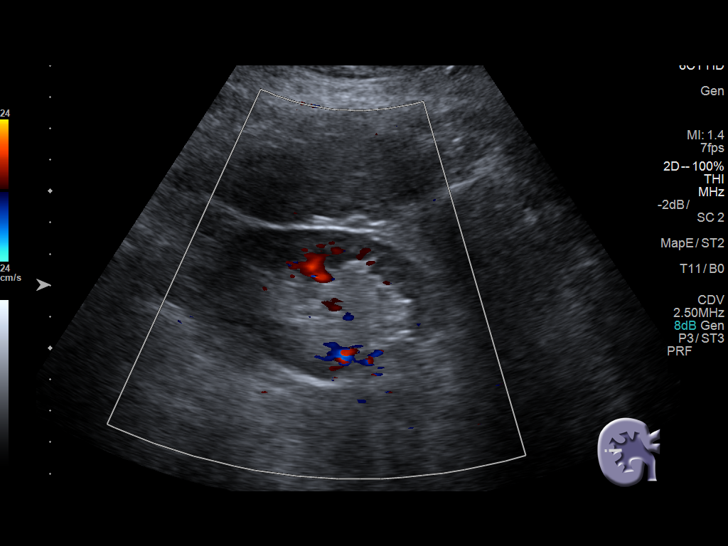
[im 27/71]
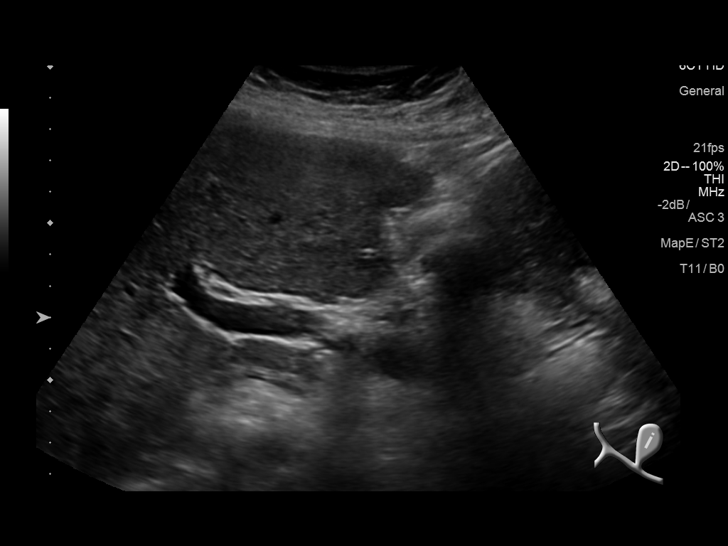
[im 33/71]
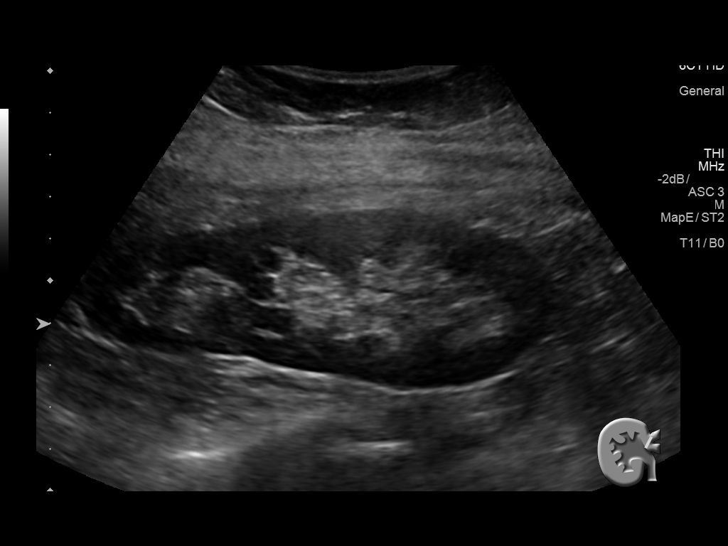
[im 38/71]
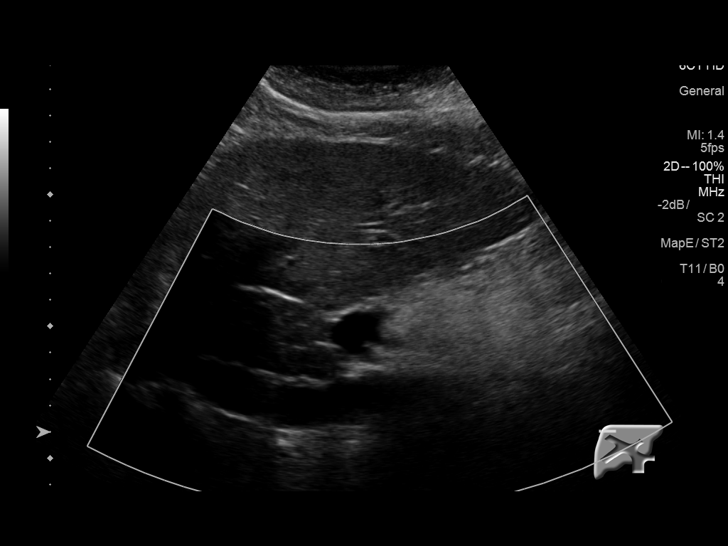
[im 44/71]
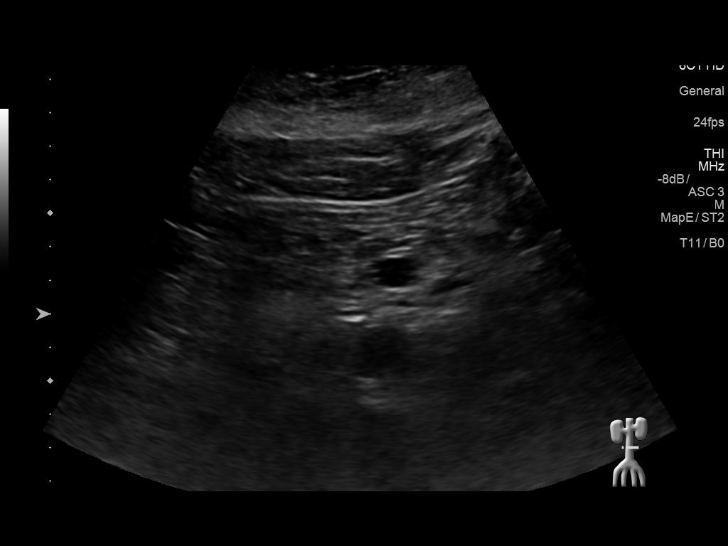
[im 47/71]
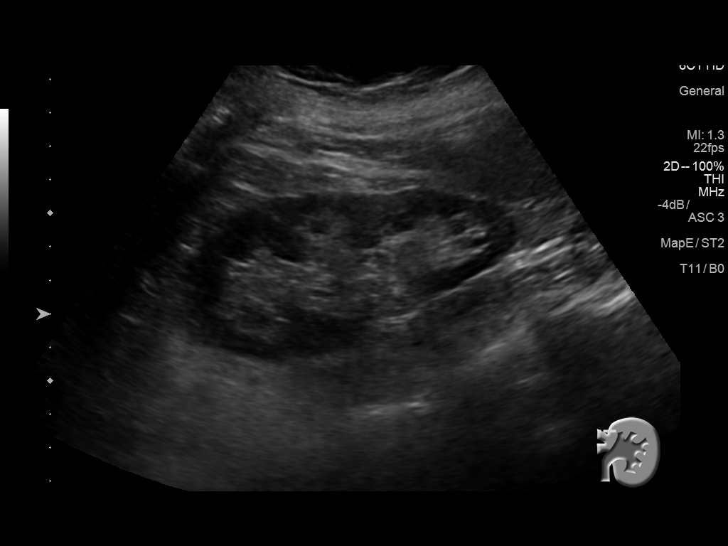
[im 53/71]
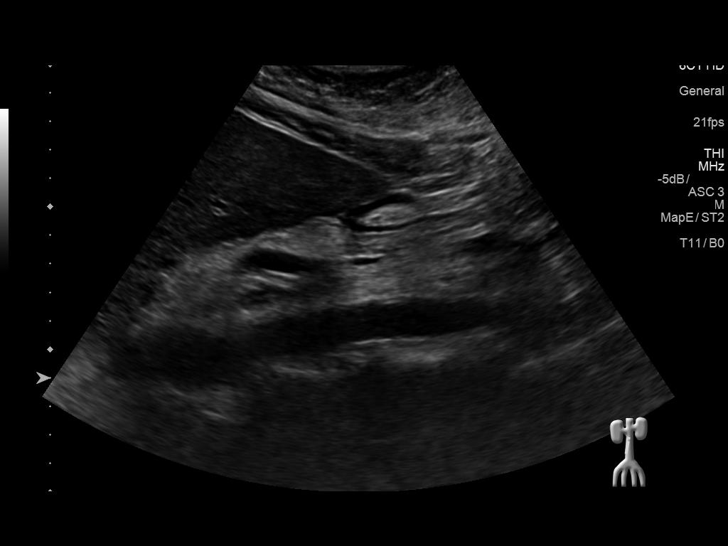
[im 59/71]
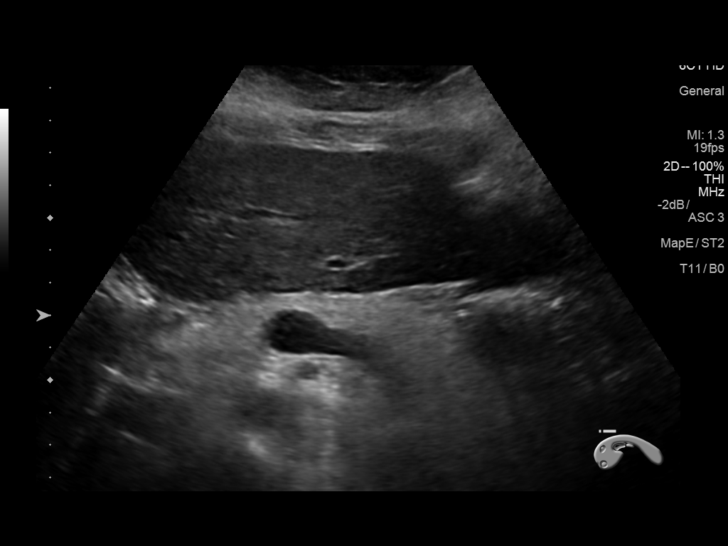
[im 65/71]
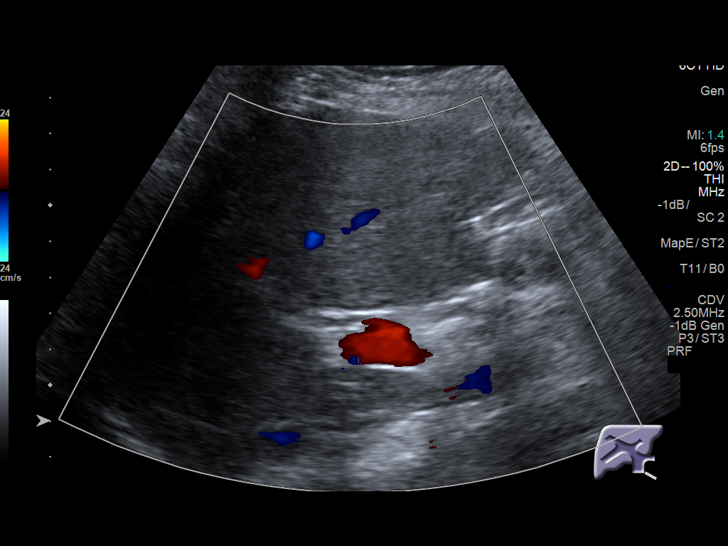
[im 71/71]
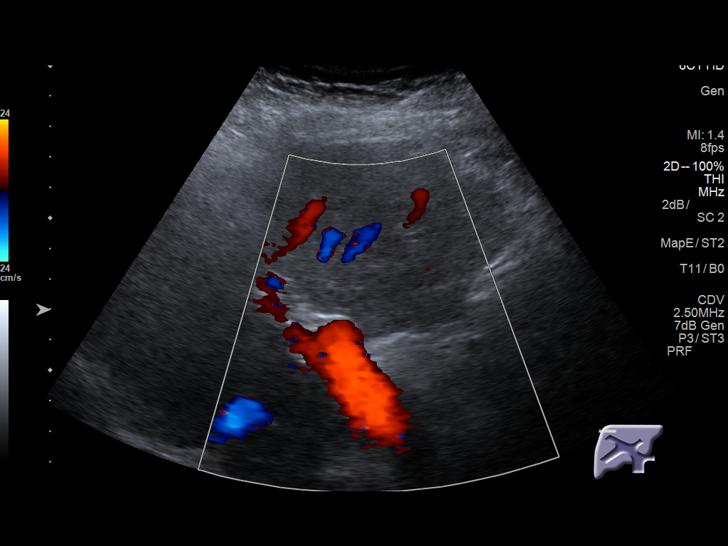

[14 of 25 positions shown; findings below may reference images not displayed]

FINDINGS: Gallbladder: Cholecystectomy.

Common bile duct: Diameter: 3 mm

Liver: Heterogeneous nodular hepatic parenchyma consistent with
known cirrhosis. No focal hepatic abnormality identified. Portal
vein patent.

IVC: No abnormality visualized.

Pancreas: Visualized portion unremarkable.

Spleen: Size and appearance within normal limits.

Right Kidney: Length: 11.8 cm. Echogenicity within normal limits. No
mass or hydronephrosis visualized.

Left Kidney: Length: 10.6 cm. Echogenicity within normal limits. No
mass or hydronephrosis visualized.

Abdominal aorta: No aneurysm visualized.

Other findings: Limited exam due to patient's body habitus.
IMPRESSION: 1. Findings consistent with cirrhosis. No focal hepatic abnormality.
2. Cholecystectomy.  No biliary distention.

## 2016-03-25 ENCOUNTER — Other Ambulatory Visit: Payer: Self-pay | Admitting: Family Medicine

## 2016-04-02 ENCOUNTER — Ambulatory Visit (INDEPENDENT_AMBULATORY_CARE_PROVIDER_SITE_OTHER): Payer: Medicare Other | Admitting: Family Medicine

## 2016-04-02 ENCOUNTER — Encounter: Payer: Self-pay | Admitting: Family Medicine

## 2016-04-02 VITALS — BP 118/71 | HR 62 | Temp 97.7°F | Ht 62.0 in | Wt 169.0 lb

## 2016-04-02 DIAGNOSIS — E781 Pure hyperglyceridemia: Secondary | ICD-10-CM | POA: Diagnosis not present

## 2016-04-02 DIAGNOSIS — Z23 Encounter for immunization: Secondary | ICD-10-CM | POA: Diagnosis not present

## 2016-04-02 DIAGNOSIS — Z79899 Other long term (current) drug therapy: Secondary | ICD-10-CM | POA: Diagnosis not present

## 2016-04-02 DIAGNOSIS — E1142 Type 2 diabetes mellitus with diabetic polyneuropathy: Secondary | ICD-10-CM | POA: Diagnosis present

## 2016-04-02 DIAGNOSIS — R233 Spontaneous ecchymoses: Secondary | ICD-10-CM | POA: Diagnosis not present

## 2016-04-02 DIAGNOSIS — I10 Essential (primary) hypertension: Secondary | ICD-10-CM | POA: Diagnosis not present

## 2016-04-02 LAB — COMPLETE METABOLIC PANEL WITH GFR
ALT: 11 U/L (ref 6–29)
AST: 15 U/L (ref 10–35)
Albumin: 3.8 g/dL (ref 3.6–5.1)
Alkaline Phosphatase: 65 U/L (ref 33–130)
BUN: 26 mg/dL — ABNORMAL HIGH (ref 7–25)
CO2: 25 mmol/L (ref 20–31)
Calcium: 9.1 mg/dL (ref 8.6–10.4)
Chloride: 103 mmol/L (ref 98–110)
Creat: 0.78 mg/dL (ref 0.50–0.99)
GFR, Est African American: >89 mL/min (ref >=60)
GFR, Est Non African American: 78 mL/min (ref >=60)
Glucose, Bld: 104 mg/dL — ABNORMAL HIGH (ref 65–99)
Potassium: 4.2 mmol/L (ref 3.5–5.3)
Sodium: 138 mmol/L (ref 135–146)
Total Bilirubin: 0.4 mg/dL (ref 0.2–1.2)
Total Protein: 6.9 g/dL (ref 6.1–8.1)

## 2016-04-02 LAB — VITAMIN B12: VITAMIN B 12: 261 pg/mL (ref 200–1100)

## 2016-04-02 LAB — LIPID PANEL
CHOL/HDL RATIO: 4.7 ratio (ref <=5.0)
Cholesterol: 183 mg/dL (ref 125–200)
HDL: 39 mg/dL — AB (ref >=46)
LDL CALC: 81 mg/dL (ref <130)
Triglycerides: 313 mg/dL — ABNORMAL HIGH (ref <150)
VLDL: 63 mg/dL — ABNORMAL HIGH (ref <30)

## 2016-04-02 LAB — CBC
HEMATOCRIT: 38.1 % (ref 35.0–45.0)
Hemoglobin: 12.6 g/dL (ref 11.7–15.5)
MCH: 27.5 pg (ref 27.0–33.0)
MCHC: 33.1 g/dL (ref 32.0–36.0)
MCV: 83 fL (ref 80.0–100.0)
MPV: 9.1 fL (ref 7.5–12.5)
PLATELETS: 143 10*3/uL (ref 140–400)
RBC: 4.59 MIL/uL (ref 3.80–5.10)
RDW: 16.1 % — AB (ref 11.0–15.0)
WBC: 5.4 10*3/uL (ref 3.8–10.8)

## 2016-04-02 LAB — POCT GLYCOSYLATED HEMOGLOBIN (HGB A1C): Hemoglobin A1C: 5.9

## 2016-04-02 NOTE — Patient Instructions (Signed)
Mammogram may be the most important thing. Watch that weight inching up. I will call Monday with blood work.   Hold the aspirin until I call.. Diabetes control is great Flu shot today.

## 2016-04-02 NOTE — Progress Notes (Signed)
   Subjective:    Patient ID: Annaleece Gatzke, female    DOB: 1946-04-11, 70 y.o.   MRN: [id number]  HPI I was present or repeated all elements documented by med student [employer]el Iha.   New problem Rash both legs.  No fever, pain, new drug exposure.  No bleeding or bruising. Old problems DM well controled. On metformin.  Check B12 DM neuropathy.  Callous on right great toe. HBP - no concerns on current meds. Needs lipids. Needs mammo Needs flu    Review of Systems     Objective:   Physical Exam Lungs clear Cardiac RRR without m or g Diabetic foot exam done.  Callous shaved.  No infection, No skin breakdown. Legs Bilateral petechial rash.  No other echymosis.       Assessment & Plan:

## 2016-04-02 NOTE — Assessment & Plan Note (Signed)
Recheck lipid panely

## 2016-04-02 NOTE — Assessment & Plan Note (Addendum)
Good control.  Neuropathy stable. Check B12 due to metformin.

## 2016-04-02 NOTE — Assessment & Plan Note (Signed)
Chronic metformin.  Check B12

## 2016-04-02 NOTE — Assessment & Plan Note (Signed)
Doubt sig.  Hold aspirin until know platelet count.  Check platelets.

## 2016-04-02 NOTE — Progress Notes (Signed)
Subjective:     Patient ID: Becky Fields, female   DOB: Jul 13, 1945, 70 y.o.   MRN: [id number]  HPI Mrs. Anija Reindel is a 70 year old female with a history of T2DM, hypertension, and Takotsubo syndrome, presents to clinic for a well visit and preventative exam.   Acute Concerns: None  Diabetes: Well controlled on current regimen. Takes metformin and glimepiride. 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 Denies HA, vision changes, dizziness, urinary/bowel changes, abdominal pain.  Diet/Exercise: Diet is well-balanced, but could benefit from a higher proportion of fruits and vegetables. Patient reports that she has not been getting much exercise lately. She works in an office setting and states that she usually does not have energy to do much physical activity when she leaves work.   Sexual/Birth History: Post menopausal. Not sexually active. G2P2  Social: Widowed. Lives at home. Currently works in an office.    Review of Systems Pertinent positives and negatives per HPI.    Objective:   Physical Exam  Constitutional: She appears well-developed and well-nourished. No distress.  HENT:  Head: Normocephalic and atraumatic.  Mouth/Throat: No oropharyngeal exudate.  Eyes: Conjunctivae and EOM are normal. Pupils are equal, round, and reactive to light.  Cardiovascular: Normal rate and normal heart sounds.   No murmur heard. Pulmonary/Chest: Effort normal. She has no wheezes. She has no rales.  Abdominal: Soft. Bowel sounds are normal. She exhibits no distension. There is no tenderness.  Musculoskeletal: She exhibits no edema.  ~2x2cm callous on the base of R big toe with central induration/ulceration  Skin: Rash noted.  Nonblanching petechial rash on legs bilaterally       Assessment:    Becky Fields is a 70 year old female with a history of T2DM, hypertension, and Takotsubo syndrome, presents to clinic for a  well visit and preventative exam.     Plan:    Diabetes -continue current regimen, A1c 5.9 today -performed diabetic foot exam and shaved callous on R big toe  Rash -petechial rash on legs bilaterally, unclear etiology -will get blood work to assess platelets -holding aspirin temporarily  Health Maintenance -given flu vaccine today -Needs to get mammogram done

## 2016-04-02 NOTE — Assessment & Plan Note (Signed)
Good control

## 2016-04-08 ENCOUNTER — Telehealth: Payer: Self-pay | Admitting: Family Medicine

## 2016-04-08 NOTE — Telephone Encounter (Signed)
Aspirin held due to petechial rash.  Platelets normal.  Called patient and rash improved.  Told to restart aspirin.

## 2016-04-08 NOTE — Telephone Encounter (Signed)
Pt is calling and would like to get some clarification on her Asprin. She was told to stop but she doesn't know if she is suppose to start this again. Please call to discuss with patient. jw

## 2016-04-15 ENCOUNTER — Other Ambulatory Visit: Payer: Self-pay | Admitting: Family Medicine

## 2016-04-15 DIAGNOSIS — Z1231 Encounter for screening mammogram for malignant neoplasm of breast: Secondary | ICD-10-CM

## 2016-05-04 ENCOUNTER — Telehealth: Payer: Self-pay | Admitting: Family Medicine

## 2016-05-04 MED ORDER — BENZONATATE 200 MG PO CAPS: 200.0000 mg | ORAL_CAPSULE | Freq: Two times a day (BID) | ORAL | 0 refills | Status: DC | PRN

## 2016-05-04 NOTE — Telephone Encounter (Signed)
Done and patient notified.

## 2016-05-04 NOTE — Telephone Encounter (Signed)
Pt called and has a terrible cough. She would like the benzonatate pearls called in. Please let her know when this is done. jw

## 2016-05-08 ENCOUNTER — Ambulatory Visit: Payer: Medicare Other

## 2016-06-08 ENCOUNTER — Ambulatory Visit (INDEPENDENT_AMBULATORY_CARE_PROVIDER_SITE_OTHER): Payer: Medicare Other | Admitting: Internal Medicine

## 2016-06-08 VITALS — BP 142/80 | HR 68 | Temp 97.5°F | Wt 165.0 lb

## 2016-06-08 DIAGNOSIS — B182 Chronic viral hepatitis C: Secondary | ICD-10-CM

## 2016-06-08 DIAGNOSIS — K7460 Unspecified cirrhosis of liver: Secondary | ICD-10-CM | POA: Diagnosis present

## 2016-06-08 NOTE — Assessment & Plan Note (Signed)
Cured. 

## 2016-06-08 NOTE — Progress Notes (Signed)
   Subjective:    Patient ID: Becky Fields, female    DOB: 06/11/1946, 70 y.o.   MRN: [id number]  HPI Here for follow up of HCV.   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   Now has completed treatment. No new issues.  SVR 24 undetectable last year so now officially considered cured.  Ultrasound of abd for [facility] screen without masses in July.   Has been to GI.     Review of Systems  Constitutional: Negative for fatigue and fever.  Gastrointestinal: Negative for diarrhea and nausea.  Skin: Negative for rash.  Neurological: Negative for dizziness and headaches.       Objective:   Physical Exam  Constitutional: She appears well-developed and well-nourished. No distress.  Eyes: No scleral icterus.  Cardiovascular: Normal rate, regular rhythm and normal heart sounds.   No murmur heard. Pulmonary/Chest: Breath sounds normal. No respiratory distress.  Lymphadenopathy:    She has no cervical adenopathy.  Skin: No rash noted.          Assessment & Plan:

## 2016-06-08 NOTE — Assessment & Plan Note (Signed)
Last ultrasound good.  Will schedule for January and July 2018.  I will see if Dr. Andria Frames wants to continue following every 6 months (every January and July or so) since she tells me she sees him twice a year.

## 2016-06-09 ENCOUNTER — Other Ambulatory Visit: Payer: Self-pay | Admitting: Internal Medicine

## 2016-06-09 ENCOUNTER — Telehealth: Payer: Self-pay | Admitting: *Deleted

## 2016-06-09 DIAGNOSIS — B1710 Acute hepatitis C without hepatic coma: Secondary | ICD-10-CM

## 2016-06-09 NOTE — Telephone Encounter (Signed)
Called patient and left a voice mail notifying her of appts for ultrasound at [place] at [facility] on 06/23/16 at 7:40 AM and 12/21/16 at 7:40 AM also at [place]. Nothing to eat or drink after midnight. Asked her to call back to confirm that she got this information.

## 2016-06-18 ENCOUNTER — Ambulatory Visit
Admission: RE | Admit: 2016-06-18 | Discharge: 2016-06-18 | Disposition: A | Discharge status: Home / Self Care | Payer: Medicare Other | Source: Ambulatory Visit | Attending: Family Medicine | Admitting: Family Medicine

## 2016-06-18 DIAGNOSIS — Z1231 Encounter for screening mammogram for malignant neoplasm of breast: Secondary | ICD-10-CM

## 2016-06-23 ENCOUNTER — Ambulatory Visit
Admission: RE | Admit: 2016-06-23 | Discharge: 2016-06-23 | Disposition: A | Discharge status: Home / Self Care | Payer: Medicare Other | Source: Ambulatory Visit | Attending: Internal Medicine | Admitting: Internal Medicine

## 2016-06-23 ENCOUNTER — Telehealth: Payer: Self-pay | Admitting: *Deleted

## 2016-06-23 DIAGNOSIS — K7460 Unspecified cirrhosis of liver: Secondary | ICD-10-CM | POA: Diagnosis not present

## 2016-06-23 DIAGNOSIS — B1710 Acute hepatitis C without hepatic coma: Secondary | ICD-10-CM

## 2016-06-23 NOTE — Telephone Encounter (Signed)
Patient notified Becky Fields  

## 2016-06-23 NOTE — Telephone Encounter (Signed)
-----   Message from Thayer Headings, MD sent at 06/23/2016 11:50 AM EST ----- Please let her know her ultrasound looks good, thanks

## 2016-07-08 ENCOUNTER — Encounter (HOSPITAL_COMMUNITY): Payer: Self-pay | Admitting: Emergency Medicine

## 2016-07-08 ENCOUNTER — Inpatient Hospital Stay (HOSPITAL_COMMUNITY)
Admission: EM | Admit: 2016-07-08 | Discharge: 2016-07-12 | DRG: 371 | Disposition: A | Discharge status: Home / Self Care | Payer: Medicare Other | Attending: Internal Medicine | Admitting: Internal Medicine

## 2016-07-08 DIAGNOSIS — N179 Acute kidney failure, unspecified: Secondary | ICD-10-CM | POA: Diagnosis present

## 2016-07-08 DIAGNOSIS — Z888 Allergy status to other drugs, medicaments and biological substances status: Secondary | ICD-10-CM | POA: Diagnosis not present

## 2016-07-08 DIAGNOSIS — Z7984 Long term (current) use of oral hypoglycemic drugs: Secondary | ICD-10-CM

## 2016-07-08 DIAGNOSIS — A045 Campylobacter enteritis: Principal | ICD-10-CM | POA: Diagnosis present

## 2016-07-08 DIAGNOSIS — R000 Tachycardia, unspecified: Secondary | ICD-10-CM | POA: Diagnosis present

## 2016-07-08 DIAGNOSIS — I10 Essential (primary) hypertension: Secondary | ICD-10-CM | POA: Diagnosis present

## 2016-07-08 DIAGNOSIS — Z7982 Long term (current) use of aspirin: Secondary | ICD-10-CM | POA: Diagnosis not present

## 2016-07-08 DIAGNOSIS — D6959 Other secondary thrombocytopenia: Secondary | ICD-10-CM | POA: Diagnosis present

## 2016-07-08 DIAGNOSIS — I959 Hypotension, unspecified: Secondary | ICD-10-CM | POA: Diagnosis not present

## 2016-07-08 DIAGNOSIS — Z79899 Other long term (current) drug therapy: Secondary | ICD-10-CM

## 2016-07-08 DIAGNOSIS — E876 Hypokalemia: Secondary | ICD-10-CM

## 2016-07-08 DIAGNOSIS — Z9049 Acquired absence of other specified parts of digestive tract: Secondary | ICD-10-CM | POA: Diagnosis not present

## 2016-07-08 DIAGNOSIS — W06XXXA Fall from bed, initial encounter: Secondary | ICD-10-CM | POA: Diagnosis present

## 2016-07-08 DIAGNOSIS — E781 Pure hyperglyceridemia: Secondary | ICD-10-CM | POA: Diagnosis present

## 2016-07-08 DIAGNOSIS — J849 Interstitial pulmonary disease, unspecified: Secondary | ICD-10-CM | POA: Diagnosis not present

## 2016-07-08 DIAGNOSIS — E119 Type 2 diabetes mellitus without complications: Secondary | ICD-10-CM | POA: Diagnosis present

## 2016-07-08 DIAGNOSIS — Z8249 Family history of ischemic heart disease and other diseases of the circulatory system: Secondary | ICD-10-CM

## 2016-07-08 DIAGNOSIS — E118 Type 2 diabetes mellitus with unspecified complications: Secondary | ICD-10-CM

## 2016-07-08 DIAGNOSIS — Z882 Allergy status to sulfonamides status: Secondary | ICD-10-CM | POA: Diagnosis not present

## 2016-07-08 DIAGNOSIS — A09 Infectious gastroenteritis and colitis, unspecified: Secondary | ICD-10-CM | POA: Diagnosis not present

## 2016-07-08 DIAGNOSIS — E872 Acidosis: Secondary | ICD-10-CM | POA: Diagnosis present

## 2016-07-08 DIAGNOSIS — R197 Diarrhea, unspecified: Secondary | ICD-10-CM | POA: Diagnosis not present

## 2016-07-08 DIAGNOSIS — B1920 Unspecified viral hepatitis C without hepatic coma: Secondary | ICD-10-CM | POA: Diagnosis present

## 2016-07-08 DIAGNOSIS — E860 Dehydration: Secondary | ICD-10-CM

## 2016-07-08 DIAGNOSIS — J9601 Acute respiratory failure with hypoxia: Secondary | ICD-10-CM | POA: Diagnosis present

## 2016-07-08 DIAGNOSIS — E871 Hypo-osmolality and hyponatremia: Secondary | ICD-10-CM | POA: Diagnosis present

## 2016-07-08 DIAGNOSIS — I9589 Other hypotension: Secondary | ICD-10-CM

## 2016-07-08 DIAGNOSIS — E869 Volume depletion, unspecified: Secondary | ICD-10-CM | POA: Diagnosis present

## 2016-07-08 DIAGNOSIS — J8489 Other specified interstitial pulmonary diseases: Secondary | ICD-10-CM | POA: Diagnosis present

## 2016-07-08 DIAGNOSIS — I493 Ventricular premature depolarization: Secondary | ICD-10-CM | POA: Diagnosis present

## 2016-07-08 DIAGNOSIS — R0600 Dyspnea, unspecified: Secondary | ICD-10-CM

## 2016-07-08 LAB — CBC
HCT: 37.8 % (ref 36.0–46.0)
HCT: 35.6 % — ABNORMAL LOW (ref 36.0–46.0)
HEMOGLOBIN: 12.7 g/dL (ref 12.0–15.0)
HEMOGLOBIN: 11.8 g/dL — AB (ref 12.0–15.0)
MCH: 28.4 pg (ref 26.0–34.0)
MCH: 28.4 pg (ref 26.0–34.0)
MCHC: 33.6 g/dL (ref 30.0–36.0)
MCHC: 33.1 g/dL (ref 30.0–36.0)
MCV: 84.6 fL (ref 78.0–100.0)
MCV: 85.6 fL (ref 78.0–100.0)
PLATELETS: 89 10*3/uL — AB (ref 150–400)
Platelets: 121 10*3/uL — ABNORMAL LOW (ref 150–400)
RBC: 4.47 MIL/uL (ref 3.87–5.11)
RBC: 4.16 MIL/uL (ref 3.87–5.11)
RDW: 14.3 % (ref 11.5–15.5)
RDW: 14.4 % (ref 11.5–15.5)
WBC: 13.8 10*3/uL — AB (ref 4.0–10.5)
WBC: 8.6 10*3/uL (ref 4.0–10.5)

## 2016-07-08 LAB — GLUCOSE, CAPILLARY
GLUCOSE-CAPILLARY: 131 mg/dL — AB (ref 65–99)
Glucose-Capillary: 110 mg/dL — ABNORMAL HIGH (ref 65–99)
Glucose-Capillary: 116 mg/dL — ABNORMAL HIGH (ref 65–99)

## 2016-07-08 LAB — COMPREHENSIVE METABOLIC PANEL
ALT: 13 U/L — ABNORMAL LOW (ref 14–54)
ANION GAP: 12 (ref 5–15)
AST: 26 U/L (ref 15–41)
Albumin: 3.5 g/dL (ref 3.5–5.0)
Alkaline Phosphatase: 57 U/L (ref 38–126)
BILIRUBIN TOTAL: 0.7 mg/dL (ref 0.3–1.2)
BUN: 38 mg/dL — ABNORMAL HIGH (ref 6–20)
CALCIUM: 8.5 mg/dL — AB (ref 8.9–10.3)
CO2: 17 mmol/L — ABNORMAL LOW (ref 22–32)
Chloride: 102 mmol/L (ref 101–111)
Creatinine, Ser: 1.84 mg/dL — ABNORMAL HIGH (ref 0.44–1.00)
GFR calc non Af Amer: 27 mL/min — ABNORMAL LOW (ref >60)
GFR, EST AFRICAN AMERICAN: 31 mL/min — AB (ref >60)
Glucose, Bld: 193 mg/dL — ABNORMAL HIGH (ref 65–99)
Potassium: 2.6 mmol/L — CL (ref 3.5–5.1)
Sodium: 131 mmol/L — ABNORMAL LOW (ref 135–145)
TOTAL PROTEIN: 7.4 g/dL (ref 6.5–8.1)

## 2016-07-08 LAB — INFLUENZA PANEL BY PCR (TYPE A & B)
INFLAPCR: NEGATIVE
INFLBPCR: NEGATIVE

## 2016-07-08 LAB — BASIC METABOLIC PANEL
ANION GAP: 7 (ref 5–15)
BUN: 36 mg/dL — AB (ref 6–20)
CHLORIDE: 109 mmol/L (ref 101–111)
CO2: 16 mmol/L — ABNORMAL LOW (ref 22–32)
Calcium: 7.3 mg/dL — ABNORMAL LOW (ref 8.9–10.3)
Creatinine, Ser: 1.7 mg/dL — ABNORMAL HIGH (ref 0.44–1.00)
GFR calc Af Amer: 34 mL/min — ABNORMAL LOW (ref >60)
GFR, EST NON AFRICAN AMERICAN: 30 mL/min — AB (ref >60)
Glucose, Bld: 125 mg/dL — ABNORMAL HIGH (ref 65–99)
Potassium: 3.4 mmol/L — ABNORMAL LOW (ref 3.5–5.1)
SODIUM: 132 mmol/L — AB (ref 135–145)

## 2016-07-08 LAB — MRSA PCR SCREENING: MRSA by PCR: NEGATIVE

## 2016-07-08 LAB — MAGNESIUM: Magnesium: 1.7 mg/dL (ref 1.7–2.4)

## 2016-07-08 LAB — LIPASE, BLOOD: Lipase: 16 U/L (ref 11–51)

## 2016-07-08 MED ORDER — METOPROLOL TARTRATE 25 MG PO TABS
50.0000 mg | ORAL_TABLET | Freq: Two times a day (BID) | ORAL | Status: DC
  Administered 2016-07-08: 50 mg via ORAL
  Filled 2016-07-08: qty 2

## 2016-07-08 MED ORDER — POTASSIUM CHLORIDE CRYS ER 20 MEQ PO TBCR
40.0000 meq | EXTENDED_RELEASE_TABLET | Freq: Once | ORAL | Status: AC
  Administered 2016-07-08: 40 meq via ORAL
  Filled 2016-07-08: qty 2

## 2016-07-08 MED ORDER — ONDANSETRON HCL 4 MG PO TABS: 4.0000 mg | ORAL_TABLET | Freq: Four times a day (QID) | ORAL | Status: DC | PRN

## 2016-07-08 MED ORDER — ACETAMINOPHEN 650 MG RE SUPP
650.0000 mg | Freq: Four times a day (QID) | RECTAL | Status: DC | PRN
Start: 2016-07-08 — End: 2016-07-13

## 2016-07-08 MED ORDER — ADULT MULTIVITAMIN W/MINERALS CH
1.0000 | ORAL_TABLET | Freq: Every day | ORAL | Status: DC
  Administered 2016-07-08 – 2016-07-12 (×5): 1 via ORAL
  Filled 2016-07-08 (×5): qty 1

## 2016-07-08 MED ORDER — ENOXAPARIN SODIUM 40 MG/0.4ML ~~LOC~~ SOLN
40.0000 mg | Freq: Every day | SUBCUTANEOUS | Status: DC
  Administered 2016-07-08 – 2016-07-09 (×2): 40 mg via SUBCUTANEOUS
  Filled 2016-07-08 (×2): qty 0.4

## 2016-07-08 MED ORDER — SODIUM CHLORIDE 0.9% FLUSH
3.0000 mL | Freq: Two times a day (BID) | INTRAVENOUS | Status: DC
  Administered 2016-07-09 – 2016-07-12 (×4): 3 mL via INTRAVENOUS

## 2016-07-08 MED ORDER — INSULIN ASPART 100 UNIT/ML ~~LOC~~ SOLN
0.0000 [IU] | Freq: Three times a day (TID) | SUBCUTANEOUS | Status: DC
  Administered 2016-07-08: 1 [IU] via SUBCUTANEOUS
  Administered 2016-07-09: 2 [IU] via SUBCUTANEOUS
  Administered 2016-07-10: 1 [IU] via SUBCUTANEOUS
  Administered 2016-07-10 – 2016-07-11 (×2): 2 [IU] via SUBCUTANEOUS
  Administered 2016-07-11: 1 [IU] via SUBCUTANEOUS
  Administered 2016-07-11: 2 [IU] via SUBCUTANEOUS
  Administered 2016-07-12: 1 [IU] via SUBCUTANEOUS
  Administered 2016-07-12: 2 [IU] via SUBCUTANEOUS

## 2016-07-08 MED ORDER — SODIUM CHLORIDE 0.9 % IV SOLN
30.0000 meq | Freq: Once | INTRAVENOUS | Status: AC
  Administered 2016-07-08: 30 meq via INTRAVENOUS
  Filled 2016-07-08: qty 15

## 2016-07-08 MED ORDER — ASPIRIN EC 81 MG PO TBEC
81.0000 mg | DELAYED_RELEASE_TABLET | Freq: Every day | ORAL | Status: DC
  Administered 2016-07-08 – 2016-07-12 (×5): 81 mg via ORAL
  Filled 2016-07-08 (×5): qty 1

## 2016-07-08 MED ORDER — SODIUM CHLORIDE 0.9 % IV BOLUS (SEPSIS)
1000.0000 mL | Freq: Once | INTRAVENOUS | Status: AC
  Administered 2016-07-08: 1000 mL via INTRAVENOUS

## 2016-07-08 MED ORDER — SODIUM CHLORIDE 0.9 % IV SOLN
INTRAVENOUS | Status: DC
  Administered 2016-07-08: 13:00:00 via INTRAVENOUS
  Administered 2016-07-08: 100 mL/h via INTRAVENOUS
  Administered 2016-07-09: 11:00:00 via INTRAVENOUS

## 2016-07-08 MED ORDER — ONDANSETRON HCL 4 MG/2ML IJ SOLN
4.0000 mg | Freq: Once | INTRAMUSCULAR | Status: AC
  Administered 2016-07-08: 4 mg via INTRAVENOUS
  Filled 2016-07-08: qty 2

## 2016-07-08 MED ORDER — ONDANSETRON HCL 4 MG/2ML IJ SOLN: 4.0000 mg | Freq: Four times a day (QID) | INTRAMUSCULAR | Status: DC | PRN

## 2016-07-08 MED ORDER — FLUTICASONE PROPIONATE 50 MCG/ACT NA SUSP
2.0000 | Freq: Every day | NASAL | Status: DC
  Administered 2016-07-08 – 2016-07-12 (×5): 2 via NASAL
  Filled 2016-07-08: qty 16

## 2016-07-08 MED ORDER — ACETAMINOPHEN 325 MG PO TABS
650.0000 mg | ORAL_TABLET | Freq: Four times a day (QID) | ORAL | Status: DC | PRN
Start: 2016-07-08 — End: 2016-07-13
  Administered 2016-07-08 – 2016-07-09 (×3): 650 mg via ORAL
  Filled 2016-07-08 (×3): qty 2

## 2016-07-08 MED ORDER — POLYVINYL ALCOHOL 1.4 % OP SOLN
Freq: Two times a day (BID) | OPHTHALMIC | Status: DC | PRN
  Filled 2016-07-08: qty 15

## 2016-07-08 NOTE — ED Notes (Signed)
Urine collection unsuccessful due to contamination.

## 2016-07-08 NOTE — H&P (Signed)
History and Physical    Becky Fields DOB: [birth year] DOA: 07/08/2016  PCP: Zigmund Gottron, MD   Patient coming from: Home   Chief Complaint: Diarrhea  HPI: Becky Fields is a 71 y.o. female with medical history significant of diabetes presentes with profuse diarrhea for the last 48 hours, persistent and worsening, severe in intensity, more than 10 episodes of watery diarrhea per day, associated with poor oral intake, fever, chills and weakness, no improving or worsening factors. This am patient fell from the bed, unable to stand due to severe weakness. Her family brought her to the hospital for further evaluation.    Positive sick contact at work.  ED Course: Patient was evaluated, found to be dehydrated, positive electrolyte abnormalities, referred for further admission and evaluation.   Review of Systems: 1. Gen. Positive for weakness, chills, fevers, poor appetite 2. ENT no runny nose or sore throat 3. Cardiovascular. No angina, claudication, no PND orthopnea 4. Pulmonary. No shortness of breath cough or hemoptysis 5. Gastrointestinal. Positive for diarrhea, no nausea or vomiting, no abdominal pain. 6. Skin no rashes 7. Hematology no easy bruisability or frequent infections 8. Endocrine no tremors, heat or cold intolerance 9. Urology no dysuria or increased urinary frequency 10. Musculoskeletal. No joint pain.   Past Medical History:  Diagnosis Date  . Cataract   . Diabetes mellitus   . Ejection fraction    .  Marland Kitchen Hepatitis C   . Hypertension   . Hypertriglyceridemia   . Takotsubo syndrome    October 16, 2012    Past Surgical History:  Procedure Laterality Date  . CHOLECYSTECTOMY    . COLONOSCOPY    . EYE SURGERY  02/2012   bilateral cataract surg  . LEFT HEART CATHETERIZATION WITH CORONARY ANGIOGRAM N/A 10/17/2012   Procedure: LEFT HEART CATHETERIZATION WITH CORONARY ANGIOGRAM;  Surgeon: Sherren Mocha, MD;  Location: [facility] [place] CATH LAB;  Service:  Cardiovascular;  Laterality: N/A;     reports that she has never smoked. She has never used smokeless tobacco. She reports that she drinks about 3.6 oz of alcohol per week . She reports that she does not use drugs.  Allergies  Allergen Reactions  . Lisinopril     REACTION: causes cough  . Sulfa Antibiotics     Told as a child allergic    Family History  Problem Relation Age of Onset  . Aneurysm Father   . Hypertension Mother   . CAD Brother 49  . Hypertension Sister   . Heart attack Neg Hx   . Stroke Neg Hx   . Colon cancer Neg Hx    Unacceptable: Noncontributory, unremarkable, or negative. Acceptable: Family history reviewed and not pertinent (If you reviewed it)  Prior to Admission medications   Medication Sig Start Date End Date Taking? Authorizing Provider  aspirin 81 MG tablet Take 81 mg by mouth daily.     Yes Historical Provider, MD  fluticasone (FLONASE) 50 MCG/ACT nasal spray Place 2 sprays into both nostrils daily. 11/06/13  Yes Zenia Resides, MD  glimepiride (AMARYL) 4 MG tablet TAKE TWO TABLETS BY MOUTH ONCE DAILY BEFORE BREAKFAST. 03/26/16  Yes Zenia Resides, MD  hydrochlorothiazide (HYDRODIURIL) 12.5 MG tablet Take 1 tablet (12.5 mg total) by mouth daily. 10/10/15  Yes Zenia Resides, MD  JANUVIA 100 MG tablet TAKE ONE TABLET BY MOUTH ONCE DAILY 09/25/15  Yes Zenia Resides, MD  loratadine (CLARITIN) 10 MG tablet TAKE ONE TABLET BY MOUTH ONCE DAILY AS NEEDED  FOR ALLERGY 02/03/16  Yes Zenia Resides, MD  losartan (COZAAR) 100 MG tablet TAKE ONE TABLET BY MOUTH ONCE DAILY 09/18/15  Yes Zenia Resides, MD  metFORMIN (GLUCOPHAGE) 500 MG tablet Take 1 tablet (500 mg total) by mouth 2 (two) times daily with a meal. 01/09/16  Yes Zenia Resides, MD  metoprolol (LOPRESSOR) 50 MG tablet TAKE ONE TABLET BY MOUTH TWICE DAILY 06/10/15  Yes Zenia Resides, MD  Multiple Vitamin (MULTIVITAMIN WITH MINERALS) TABS Take 1 tablet by mouth daily. Reported on 12/17/2015   Yes  Historical Provider, MD  Polyethyl Glycol-Propyl Glycol (SYSTANE OP) Apply 1 drop to eye 2 (two) times daily as needed (dry eyes). Reported on 09/05/2015   Yes Historical Provider, MD    Physical Exam: Vitals:   07/08/16 0524 07/08/16 0653  BP: (!) 88/48 (!) 90/39  Pulse: 88 100  Resp: 14 18  Temp: 98 F (36.7 C)   TempSrc: Oral   SpO2: 97% 94%      Constitutional: deconditioned, ill looking appearing Vitals:   07/08/16 0524 07/08/16 0653  BP: (!) 88/48 (!) 90/39  Pulse: 88 100  Resp: 14 18  Temp: 98 F (36.7 C)   TempSrc: Oral   SpO2: 97% 94%   Eyes: PERRL, lids and conjunctivae pale.  Head normocephalic, nose and ears no deformities.  ENMT: Mucous membranes are dry. Posterior pharynx clear of any exudate or lesions.Normal dentition.  Neck: normal, supple, no masses, no thyromegaly Respiratory: decreased breath sounds at bases due to poor inspiratory effort, no wheezing, no crackles. Normal respiratory effort. No accessory muscle use.  Cardiovascular: Regular rate and rhythm, no murmurs / rubs / gallops. No extremity edema. 2+ pedal pulses. No carotid bruits.  Abdomen: distended, tympanic to percussion, no tenderness, no masses palpated. No hepatosplenomegaly. Bowel sounds are increased.   Musculoskeletal: no clubbing / cyanosis. No joint deformity upper and lower extremities. Good ROM, no contractures. Normal muscle tone.  Skin: no rashes, lesions, ulcers. No induration Neurologic: CN 2-12 grossly intact. Sensation intact, DTR normal. Strength 5/5 in all 4.    Labs on Admission: I have personally reviewed following labs and imaging studies  CBC:  Recent Labs Lab 07/08/16 0558  WBC 13.8*  HGB 12.7  HCT 37.8  MCV 84.6  PLT 123XX123*   Basic Metabolic Panel:  Recent Labs Lab 07/08/16 0558  NA 131*  K 2.6*  CL 102  CO2 17*  GLUCOSE 193*  BUN 38*  CREATININE 1.84*  CALCIUM 8.5*   GFR: CrCl cannot be calculated (Unknown ideal weight.). Liver Function  Tests:  Recent Labs Lab 07/08/16 0558  AST 26  ALT 13*  ALKPHOS 57  BILITOT 0.7  PROT 7.4  ALBUMIN 3.5    Recent Labs Lab 07/08/16 0558  LIPASE 16   No results for input(s): AMMONIA in the last 168 hours. Coagulation Profile: No results for input(s): INR, PROTIME in the last 168 hours. Cardiac Enzymes: No results for input(s): CKTOTAL, CKMB, CKMBINDEX, TROPONINI in the last 168 hours. BNP (last 3 results) No results for input(s): PROBNP in the last 8760 hours. HbA1C: No results for input(s): HGBA1C in the last 72 hours. CBG: No results for input(s): GLUCAP in the last 168 hours. Lipid Profile: No results for input(s): CHOL, HDL, LDLCALC, TRIG, CHOLHDL, LDLDIRECT in the last 72 hours. Thyroid Function Tests: No results for input(s): TSH, T4TOTAL, FREET4, T3FREE, THYROIDAB in the last 72 hours. Anemia Panel: No results for input(s): VITAMINB12, FOLATE, FERRITIN, TIBC, IRON,  RETICCTPCT in the last 72 hours. Urine analysis:    Component Value Date/Time   COLORURINE AMBER (A) 10/16/2012 1429   APPEARANCEUR CLEAR 10/16/2012 1429   LABSPEC 1.020 10/16/2012 1429   PHURINE 5.0 10/16/2012 1429   GLUCOSEU NEGATIVE 10/16/2012 1429   HGBUR NEGATIVE 10/16/2012 1429   HGBUR trace-intact 04/13/2008 1321   BILIRUBINUR NEG 03/21/2015 0845   KETONESUR 15 (A) 10/16/2012 1429   PROTEINUR NEG 03/21/2015 0845   PROTEINUR NEGATIVE 10/16/2012 1429   UROBILINOGEN 0.2 03/21/2015 0845   UROBILINOGEN 1.0 10/16/2012 1429   NITRITE NEG 03/21/2015 0845   NITRITE NEGATIVE 10/16/2012 1429   LEUKOCYTESUR large (3+) (A) 03/21/2015 0845   Sepsis Labs: !!!!!!!!!!!!!!!!!!!!!!!!!!!!!!!!!!!!!!!!!!!! @LABRCNTIP (procalcitonin:4,lacticidven:4) )No results found for this or any previous visit (from the past 240 hour(s)).   Radiological Exams on Admission: No results found.  EKG: Independently reviewed. NA   Assessment/Plan Active Problems:   Diarrhea   This is a 71 year old female who  presented to the hospital with chief complaint of severe diarrhea and weakness. Poor appetite and chills. On initial physical examination she was found hypotensive with a blood pressure 88/48, temperature 98, heart rate 88, respiratory rate 14, oxygen saturation 97% on room air. Her oral mucosae is dry, her lungs are clear to auscultation, heart S1-S2 present, abdomen is distended and tympanic. No lower extremity edema. Sodium 131, potassium 2.6, chloride 102, bicarbonate 17, glucose 193, BUN 38, creatinine 1.84 (baseline 0.78), AST 26, ALT 13, white count 13.8, hemoglobin 12.7, hematocrit 37.8, platelets 121.   The patient will be admitted to the hospital with the working diagnosis of dehydration complicated by hypotension, acute kidney injury, hyponatremia, hypokalemia, rule out influenza.  1. Hypotension. Suspected to be due to volume depletion, patient has received IV fluids with improvement of blood pressure up to 103/50, will continue hydration with normal saline, will rebolus if needed, will target a mean blood pressure of 65. Old records personally reviewed, echocardiography January 2017, normal LV systolic function. Will hold on antihypertensive agents losartan and hydrochlorothiazide, will place holding parameters on metoprolol.   2. Acute kidney injury, hyponatremia, hypokalemia. Due to volume loss due to diarrhea, complicated by poor oral intake. Will continue hydration with isotonic saline at 100 m/ HR. patient has received about 70 meq of potassium chloride in the ED, will recheck chemistry this afternoon at 2 PM. Will check magnesium. Noted non-gap metabolic acidosis probably due to diarrhea.   3. Diarrhea. Suspected to be viral in origin, positive sick contact, positive subjective fevers. Occasionally influenza can give mainly GI symptoms. Influenza PCR will be ordered.   4. Diabetes mellitus type 2. Will hold on oral hypoglycemic agents, glimepiride, Januvia and metformin. Diabetic diet,  insulin sliding scale for glucose coverage and monitoring. Will hold on basal insulin until insulin requirements are calculated over the next 24 hours.   DVT prophylaxis: enoxaparin Code Status:  Full  Family Communication: I spoke with patient's family at the bedside and all questions were addressed  Disposition Plan: Home  Consults called: None  Admission status: Inpatient   Mauricio Gerome Apley MD [facility] Pager [phone]  If 7PM-7AM, please contact night-coverage [email or web address] Password [facility]  07/08/2016, 8:18 AM

## 2016-07-08 NOTE — ED Provider Notes (Signed)
[facility] DEPT Provider Note   CSN: [id number] Arrival date & time: 07/08/16  0501     History   Chief Complaint Chief Complaint  Patient presents with  . Diarrhea    HPI Becky Fields is a 71 y.o. female.  Patient is a 71 year old female with past medical history of diabetes, hypertension, and hepatitis C. She presents for evaluation of diarrhea. She reports not feeling well over the past 2 days. She started earlier yesterday with diarrhea. She reports everything she eats "goes right through her". She denies any abdominal pain or vomiting. She denies any blood in her stools or black, tarry stools.    Diarrhea   This is a new problem. The current episode started 2 days ago. The problem occurs 2 to 4 times per day. The problem has been gradually worsening. The stool consistency is described as watery. There has been no fever. Pertinent negatives include no abdominal pain, no vomiting and no chills. She has tried nothing for the symptoms. The treatment provided no relief.    Past Medical History:  Diagnosis Date  . Cataract   . Diabetes mellitus   . Ejection fraction    .  Marland Kitchen Hepatitis C   . Hypertension   . Hypertriglyceridemia   . Takotsubo syndrome    October 16, 2012    Patient Active Problem List   Diagnosis Date Noted  . Other long term (current) drug therapy 04/02/2016  . Petechial rash 12/19/2014  . Hepatic cirrhosis ([place]) 05/23/2014  . Mitral regurgitation 10/18/2012  . Takotsubo syndrome 10/18/2012  . HYPERTRIGLYCERIDEMIA 01/25/2009  . INSOMNIA, PERSISTENT 01/25/2009  . Type 2 diabetes mellitus with peripheral neuropathy ([place]) 04/13/2008  . OBESITY 02/21/2008  . History of colonic polyps 05/02/2007  . Chronic hepatitis C without hepatic coma ([place]) 03/07/2007  . HYPERTENSION, BENIGN ESSENTIAL 01/28/2007    Past Surgical History:  Procedure Laterality Date  . CHOLECYSTECTOMY    . COLONOSCOPY    . EYE SURGERY  02/2012   bilateral cataract surg  . LEFT  HEART CATHETERIZATION WITH CORONARY ANGIOGRAM N/A 10/17/2012   Procedure: LEFT HEART CATHETERIZATION WITH CORONARY ANGIOGRAM;  Surgeon: Sherren Mocha, MD;  Location: [facility] LAB;  Service: Cardiovascular;  Laterality: N/A;    OB History    No data available       Home Medications    Prior to Admission medications   Medication Sig Start Date End Date Taking? Authorizing Provider  aspirin 81 MG tablet Take 81 mg by mouth daily.      Historical Provider, MD  fluticasone (FLONASE) 50 MCG/ACT nasal spray Place 2 sprays into both nostrils daily. 11/06/13   Zenia Resides, MD  glimepiride (AMARYL) 4 MG tablet TAKE TWO TABLETS BY MOUTH ONCE DAILY BEFORE BREAKFAST. 03/26/16   Zenia Resides, MD  hydrochlorothiazide (HYDRODIURIL) 12.5 MG tablet Take 1 tablet (12.5 mg total) by mouth daily. 10/10/15   Zenia Resides, MD  JANUVIA 100 MG tablet TAKE ONE TABLET BY MOUTH ONCE DAILY 09/25/15   Zenia Resides, MD  loratadine (CLARITIN) 10 MG tablet TAKE ONE TABLET BY MOUTH ONCE DAILY AS NEEDED FOR ALLERGY 02/03/16   Zenia Resides, MD  losartan (COZAAR) 100 MG tablet TAKE ONE TABLET BY MOUTH ONCE DAILY 09/18/15   Zenia Resides, MD  metFORMIN (GLUCOPHAGE) 500 MG tablet Take 1 tablet (500 mg total) by mouth 2 (two) times daily with a meal. 01/09/16   Zenia Resides, MD  metoprolol (LOPRESSOR) 50 MG tablet  TAKE ONE TABLET BY MOUTH TWICE DAILY 06/10/15   Zenia Resides, MD  Multiple Vitamin (MULTIVITAMIN WITH MINERALS) TABS Take 1 tablet by mouth daily. Reported on 12/17/2015    Historical Provider, MD  Polyethyl Glycol-Propyl Glycol (SYSTANE OP) Apply 1 drop to eye 2 (two) times daily as needed (dry eyes). Reported on 09/05/2015    Historical Provider, MD    Family History Family History  Problem Relation Age of Onset  . Aneurysm Father   . Hypertension Mother   . CAD Brother 67  . Hypertension Sister   . Heart attack Neg Hx   . Stroke Neg Hx   . Colon cancer Neg Hx     Social  History Social History  Substance Use Topics  . Smoking status: Never Smoker  . Smokeless tobacco: Never Used  . Alcohol use 3.6 oz/week    6 Standard drinks or equivalent per week     Comment: Occasional glass of wine on the weekends     Allergies   Lisinopril and Sulfa antibiotics   Review of Systems Review of Systems  Constitutional: Negative for chills.  Gastrointestinal: Positive for diarrhea. Negative for abdominal pain and vomiting.  All other systems reviewed and are negative.    Physical Exam Updated Vital Signs BP (!) 88/48 (BP Location: Left Arm)   Pulse 88   Temp 98 F (36.7 C) (Oral)   Resp 14   SpO2 97%   Physical Exam  Constitutional: She is oriented to person, place, and time. She appears well-developed and well-nourished. No distress.  HENT:  Head: Normocephalic and atraumatic.  Mouth/Throat: Oropharynx is clear and moist.  Neck: Normal range of motion. Neck supple.  Cardiovascular: Normal rate and regular rhythm.  Exam reveals no gallop and no friction rub.   No murmur heard. Pulmonary/Chest: Effort normal and breath sounds normal. No respiratory distress. She has no wheezes.  Abdominal: Soft. Bowel sounds are normal. She exhibits no distension. There is no tenderness.  Musculoskeletal: Normal range of motion.  Neurological: She is alert and oriented to person, place, and time.  Skin: Skin is warm and dry. She is not diaphoretic.  Nursing note and vitals reviewed.    ED Treatments / Results  Labs (all labs ordered are listed, but only abnormal results are displayed) Labs Reviewed  LIPASE, BLOOD  COMPREHENSIVE METABOLIC PANEL  CBC  URINALYSIS, ROUTINE W REFLEX MICROSCOPIC    EKG  EKG Interpretation None       Radiology No results found.  Procedures Procedures (including critical care time)  Medications Ordered in ED Medications  sodium chloride 0.9 % bolus 1,000 mL (not administered)  ondansetron (ZOFRAN) injection 4 mg (not  administered)     Initial Impression / Assessment and Plan / ED Course  I have reviewed the triage vital signs and the nursing notes.  Pertinent labs & imaging results that were available during my care of the patient were reviewed by me and considered in my medical decision making (see chart for details).  Clinical Course     Patient presents with a 2 day history of diarrhea. She feels weak and workup is consistent with dehydration. Her creatinine has increased from 0.8-1.8 and she also has significant hypokalemia with potassium of 2.6. She was given IV and oral potassium as well as IV fluids.  I feel as though she will require admission for hydration, potassium replacement, and close observation of her renal function. I've spoken with Dr. Cathlean Sauer who agrees to admit.  Final Clinical Impressions(s) / ED Diagnoses   Final diagnoses:  None    New Prescriptions New Prescriptions   No medications on file     Veryl Speak, MD 07/08/16 [phone]

## 2016-07-08 NOTE — ED Triage Notes (Signed)
Pt c/o diarrhea that began yesterday; pt has occasional diarrhea from her metformin that she takes imodium for; pt took 8 imodium pills in past day; pt's son found her laying in her own stool; pt endorses weakness; denies recent antibiotic use

## 2016-07-09 ENCOUNTER — Inpatient Hospital Stay (HOSPITAL_COMMUNITY): Payer: Medicare Other

## 2016-07-09 DIAGNOSIS — E876 Hypokalemia: Secondary | ICD-10-CM

## 2016-07-09 DIAGNOSIS — A09 Infectious gastroenteritis and colitis, unspecified: Secondary | ICD-10-CM

## 2016-07-09 DIAGNOSIS — I959 Hypotension, unspecified: Secondary | ICD-10-CM

## 2016-07-09 LAB — GLUCOSE, CAPILLARY
Glucose-Capillary: 129 mg/dL — ABNORMAL HIGH (ref 65–99)
Glucose-Capillary: 198 mg/dL — ABNORMAL HIGH (ref 65–99)
Glucose-Capillary: 90 mg/dL (ref 65–99)
Glucose-Capillary: 122 mg/dL — ABNORMAL HIGH (ref 65–99)

## 2016-07-09 LAB — GASTROINTESTINAL PANEL BY PCR, STOOL (REPLACES STOOL CULTURE)
ASTROVIRUS: NOT DETECTED
Adenovirus F40/41: NOT DETECTED
Campylobacter species: DETECTED — AB
Cryptosporidium: NOT DETECTED
Cyclospora cayetanensis: NOT DETECTED
ENTAMOEBA HISTOLYTICA: NOT DETECTED
ENTEROAGGREGATIVE E COLI (EAEC): NOT DETECTED
ENTEROPATHOGENIC E COLI (EPEC): NOT DETECTED
ENTEROTOXIGENIC E COLI (ETEC): NOT DETECTED
GIARDIA LAMBLIA: NOT DETECTED
NOROVIRUS GI/GII: NOT DETECTED
Plesimonas shigelloides: NOT DETECTED
ROTAVIRUS A: NOT DETECTED
SHIGELLA/ENTEROINVASIVE E COLI (EIEC): NOT DETECTED
Salmonella species: NOT DETECTED
Sapovirus (I, II, IV, and V): NOT DETECTED
Shiga like toxin producing E coli (STEC): NOT DETECTED
VIBRIO CHOLERAE: NOT DETECTED
Vibrio species: NOT DETECTED
Yersinia enterocolitica: NOT DETECTED

## 2016-07-09 LAB — C DIFFICILE QUICK SCREEN W PCR REFLEX
C DIFFICILE (CDIFF) TOXIN: NEGATIVE
C DIFFICLE (CDIFF) ANTIGEN: NEGATIVE
C Diff interpretation: NOT DETECTED

## 2016-07-09 LAB — CBC
HEMATOCRIT: 33.3 % — AB (ref 36.0–46.0)
Hemoglobin: 10.9 g/dL — ABNORMAL LOW (ref 12.0–15.0)
MCH: 27.9 pg (ref 26.0–34.0)
MCHC: 32.7 g/dL (ref 30.0–36.0)
MCV: 85.4 fL (ref 78.0–100.0)
PLATELETS: 68 10*3/uL — AB (ref 150–400)
RBC: 3.9 MIL/uL (ref 3.87–5.11)
RDW: 14.5 % (ref 11.5–15.5)
WBC: 5.3 10*3/uL (ref 4.0–10.5)

## 2016-07-09 LAB — COMPREHENSIVE METABOLIC PANEL
ALBUMIN: 2.6 g/dL — AB (ref 3.5–5.0)
ALT: 15 U/L (ref 14–54)
AST: 25 U/L (ref 15–41)
Alkaline Phosphatase: 41 U/L (ref 38–126)
Anion gap: 7 (ref 5–15)
BILIRUBIN TOTAL: 0.8 mg/dL (ref 0.3–1.2)
BUN: 32 mg/dL — AB (ref 6–20)
CHLORIDE: 112 mmol/L — AB (ref 101–111)
CO2: 17 mmol/L — ABNORMAL LOW (ref 22–32)
CREATININE: 1.25 mg/dL — AB (ref 0.44–1.00)
Calcium: 7.5 mg/dL — ABNORMAL LOW (ref 8.9–10.3)
GFR calc Af Amer: 50 mL/min — ABNORMAL LOW (ref >60)
GFR, EST NON AFRICAN AMERICAN: 43 mL/min — AB (ref >60)
GLUCOSE: 146 mg/dL — AB (ref 65–99)
POTASSIUM: 3.3 mmol/L — AB (ref 3.5–5.1)
Sodium: 136 mmol/L (ref 135–145)
TOTAL PROTEIN: 5.4 g/dL — AB (ref 6.5–8.1)

## 2016-07-09 MED ORDER — SACCHAROMYCES BOULARDII 250 MG PO CAPS
250.0000 mg | ORAL_CAPSULE | Freq: Two times a day (BID) | ORAL | Status: DC
  Administered 2016-07-09 – 2016-07-12 (×7): 250 mg via ORAL
  Filled 2016-07-09 (×7): qty 1

## 2016-07-09 MED ORDER — LEVOFLOXACIN IN D5W 750 MG/150ML IV SOLN
750.0000 mg | INTRAVENOUS | Status: DC
  Administered 2016-07-09: 750 mg via INTRAVENOUS
  Filled 2016-07-09: qty 150

## 2016-07-09 MED ORDER — CIPROFLOXACIN IN D5W 400 MG/200ML IV SOLN: 400.0000 mg | Freq: Two times a day (BID) | INTRAVENOUS | Status: DC

## 2016-07-09 MED ORDER — SODIUM CHLORIDE 0.9 % IV SOLN
INTRAVENOUS | Status: DC
  Administered 2016-07-10: 13:00:00 via INTRAVENOUS

## 2016-07-09 MED ORDER — METRONIDAZOLE 500 MG PO TABS: 500.0000 mg | ORAL_TABLET | Freq: Three times a day (TID) | ORAL | Status: DC

## 2016-07-09 MED ORDER — POTASSIUM CHLORIDE CRYS ER 20 MEQ PO TBCR
40.0000 meq | EXTENDED_RELEASE_TABLET | Freq: Four times a day (QID) | ORAL | Status: AC
  Administered 2016-07-09 (×2): 40 meq via ORAL
  Filled 2016-07-09 (×3): qty 2

## 2016-07-09 NOTE — Progress Notes (Signed)
PROGRESS NOTE                                                                                                                                                                                                             Patient Demographics:    Becky Fields, is a 71 y.o. female, DOB - Sep 28, 1945, [id number]  Admit date - 07/08/2016   Admitting Physician Mauricio Gerome Apley, MD  Outpatient Primary MD for the patient is Zigmund Gottron, MD  LOS - 1  Chief Complaint  Patient presents with  . Diarrhea       Brief Narrative   71 y.o. female with medical history significant of diabetes presentes with profuse diarrhea for the last 48 hours   Subjective:    Becky Fields today has, No headache, No chest pain, No abdominal pain - Reports she is feeling better, but still very weak, reports still having significant diarrhea, denies nausea vomiting or abdominal pain     Assessment  & Plan :    Active Problems:   Diarrhea   Hypokalemia   Hypotension  Diarrhea - Likely due to infectious etiology, will check abdominal portable x-ray, C. difficile negative, will start an IV Cipro and Flagyl given she is febrile, limiting soft diet, follow on GI panel  Hypotension.  - Suspected to be due to volume depletion From diarrhea. - Improving with IV fluids. - Continue to hold antihypertensive medication .  Acute kidney injury - Due to volume loss due to diarrhea, complicated by poor oral intake.  - Improving with IV fluids   Hypokalemia - Continue to diarrhea, depleted, recheck in a.m. with magnesium and phosphorus  Hypertension - To neutral hold meds given soft blood pressure on admission  Diabetes mellitus type 2.  - Will hold on oral hypoglycemic agents, glimepiride, Januvia and metformin.  - CBGs are controlled on insulin sliding scale for glucose coverage and monitoring.   Acute hypoxic respiratory failure - Patient saturating in the  80s on room air on admission, currently on 4 L nasal cannula, will obtain  chest x-ray, tinea with oxygen as needed  Thrombocytopenia - Most likely related to infection, continue to monitor  Code Status : Full  Family Communication  : none at bedside  Disposition Plan  : pending further work up.  Consults  :  None  Procedures  :  None  DVT Prophylaxis  :  Lovenox held secondary to thrombocytopenia  Lab Results  Component Value Date   PLT 68 (L) 07/09/2016    Antibiotics  :   Anti-infectives    None        Objective:   Vitals:   07/09/16 1000 07/09/16 1200 07/09/16 1300 07/09/16 1400  BP: 118/66 (!) 125/42  129/60  Pulse: (!) 107 (!) 105  100  Resp: (!) 23 (!) 26  (!) 28  Temp:   (!) 100.9 F (38.3 C)   TempSrc:   Oral   SpO2: 90% 90%  95%  Weight:      Height:        Wt Readings from Last 3 Encounters:  07/09/16 74.8 kg (165 lb)  06/08/16 74.8 kg (165 lb)  04/02/16 76.7 kg (169 lb)     Intake/Output Summary (Last 24 hours) at 07/09/16 1441 Last data filed at 07/09/16 0900  Gross per 24 hour  Intake             1400 ml  Output                0 ml  Net             1400 ml     Physical Exam  Awake Alert, Oriented X 3, Frail, ill-appearing Supple Neck,No JVD Symmetrical Chest wall movement, Good air movement bilaterally, CTAB RRR,No Gallops,Rubs or new Murmurs, No Parasternal Heave +ve B.Sounds, Abd Soft, No tenderness, No rebound - guarding or rigidity. No Cyanosis, Clubbing or edema, No new Rash or bruise      Data Review:    CBC  Recent Labs Lab 07/08/16 0558 07/08/16 1343 07/09/16 0336  WBC 13.8* 8.6 5.3  HGB 12.7 11.8* 10.9*  HCT 37.8 35.6* 33.3*  PLT 121* 89* 68*  MCV 84.6 85.6 85.4  MCH 28.4 28.4 27.9  MCHC 33.6 33.1 32.7  RDW 14.3 14.4 14.5    Chemistries   Recent Labs Lab 07/08/16 0558 07/08/16 1343 07/09/16 0336  NA 131* 132* 136  K 2.6* 3.4* 3.3*  CL 102 109 112*  CO2 17* 16* 17*  GLUCOSE 193* 125* 146*  BUN  38* 36* 32*  CREATININE 1.84* 1.70* 1.25*  CALCIUM 8.5* 7.3* 7.5*  MG  --  1.7  --   AST 26  --  25  ALT 13*  --  15  ALKPHOS 57  --  41  BILITOT 0.7  --  0.8   ------------------------------------------------------------------------------------------------------------------ No results for input(s): CHOL, HDL, LDLCALC, TRIG, CHOLHDL, LDLDIRECT in the last 72 hours.  Lab Results  Component Value Date   HGBA1C 5.9 04/02/2016   ------------------------------------------------------------------------------------------------------------------ No results for input(s): TSH, T4TOTAL, T3FREE, THYROIDAB in the last 72 hours.  Invalid input(s): FREET3 ------------------------------------------------------------------------------------------------------------------ No results for input(s): VITAMINB12, FOLATE, FERRITIN, TIBC, IRON, RETICCTPCT in the last 72 hours.  Coagulation profile No results for input(s): INR, PROTIME in the last 168 hours.  No results for input(s): DDIMER in the last 72 hours.  Cardiac Enzymes No results for input(s): CKMB, TROPONINI, MYOGLOBIN in the last 168 hours.  Invalid input(s): CK ------------------------------------------------------------------------------------------------------------------ No results found for: BNP  Inpatient Medications  Scheduled Meds: . aspirin EC  81 mg Oral Daily  . fluticasone  2 spray Each Nare Daily  . insulin aspart  0-9 Units Subcutaneous TID WC  . metoprolol  50 mg Oral BID  . multivitamin with minerals  1 tablet Oral Daily  . sodium chloride flush  3 mL Intravenous Q12H   Continuous Infusions: . sodium chloride 100 mL/hr at 07/09/16 1037   PRN Meds:.acetaminophen **OR** acetaminophen, ondansetron **OR** ondansetron (ZOFRAN) IV, polyvinyl alcohol  Micro Results Recent Results (from the past 240 hour(s))  MRSA PCR Screening     Status: None   Collection Time: 07/08/16 12:25 PM  Result Value Ref Range Status   MRSA  by PCR NEGATIVE NEGATIVE Final    Comment:        The GeneXpert MRSA Assay (FDA approved for NASAL specimens only), is one component of a comprehensive MRSA colonization surveillance program. It is not intended to diagnose MRSA infection nor to guide or monitor treatment for MRSA infections.   C difficile quick scan w PCR reflex     Status: None   Collection Time: 07/09/16  8:11 AM  Result Value Ref Range Status   C Diff antigen NEGATIVE NEGATIVE Final   C Diff toxin NEGATIVE NEGATIVE Final   C Diff interpretation No C. difficile detected.  Final    Radiology Reports Mm Digital Screening Bilateral  Result Date: 06/18/2016 CLINICAL DATA:  Screening. EXAM: DIGITAL SCREENING BILATERAL MAMMOGRAM WITH CAD COMPARISON:  Previous exam(s). ACR Breast Density Category b: There are scattered areas of fibroglandular density. FINDINGS: There are no findings suspicious for malignancy. Images were processed with CAD. IMPRESSION: No mammographic evidence of malignancy. A result letter of this screening mammogram will be mailed directly to the patient. RECOMMENDATION: Screening mammogram in one year. (Code:SM-B-01Y) BI-RADS CATEGORY  1: Negative. Electronically Signed   By: Ammie Ferrier M.D.   On: 06/18/2016 16:36   US Abdomen Limited Ruq  Result Date: 06/23/2016 CLINICAL DATA:  Hepatitis-C.  Cirrhosis. EXAM: US ABDOMEN LIMITED - RIGHT UPPER QUADRANT COMPARISON:  12/26/2015 FINDINGS: Gallbladder: Removed Common bile duct: Diameter: 3.1 mm, normal. Liver: Coarse echotexture without focal lesions. The appearance is essentially unchanged since the prior exam. IMPRESSION: No focal lesions in the liver. Unchanged coarse echotexture consistent with the history of cirrhosis. Electronically Signed   By: Lorriane Shire M.D.   On: 06/23/2016 08:43     ELGERGAWY, DAWOOD M.D on 07/09/2016 at 2:41 PM  Between 7am to 7pm - Pager - [phone]  After 7pm go to [email or web address] - password [facility]  [facility] -  Office  [phone]

## 2016-07-09 NOTE — Progress Notes (Signed)
Pharmacy Antibiotic Note  Becky Fields is a 71 y.o. female admitted on 07/08/2016 with intra-abdominal campylobacter colitis.  Pharmacy has been consulted for levaquin dosing.  Plan: levaquin 750mg  IV q48h for CrCl b/n 30-50 mls/min Follow renal function  Height: 5\' 2"  (157.5 cm) Weight: 165 lb (74.8 kg) IBW/kg (Calculated) : 50.1  Temp (24hrs), Avg:99.7 F (37.6 C), Min:98.3 F (36.8 C), Max:100.9 F (38.3 C)   Recent Labs Lab 07/08/16 0558 07/08/16 1343 07/09/16 0336  WBC 13.8* 8.6 5.3  CREATININE 1.84* 1.70* 1.25*    Estimated Creatinine Clearance: 40.2 mL/min (by C-G formula based on SCr of 1.25 mg/dL (H)).    Allergies  Allergen Reactions  . Lisinopril     REACTION: causes cough  . Sulfa Antibiotics     Told as a child allergic    Antimicrobials this admission: 1/18 levaquin >>   Microbiology results:  1/18 GI PCR: campylobacter species 1/18 CDiff PCR: neg 1/17 MRSA PCR: neg  Thank you for allowing pharmacy to be a part of this patient's care.  Dolly Rias RPh 07/09/2016, 4:20 PM Pager [phone]

## 2016-07-09 NOTE — Progress Notes (Signed)
Patient verbalizes that she is having intermittent stomach cramps which she states make her feel as though she needs to urinate. Patient is having multiple watery stools and is unable to tell is she is urinating. Patient bladder scanned and largest residual noted was 72cc. MD is aware of this information and per MD will add order for KUB. Patient states " I think I might be going but I don't know." the patient is not showing signs of bladder distention or acute urinary retention. Will continue to follow up.

## 2016-07-09 NOTE — Progress Notes (Signed)
CRITICAL VALUE ALERT  Critical value received:  positive GI panel for Campylobacter  Date of notification:  07/09/16  Time of notification:  [id number]  Critical value read back:Yes.    Nurse who received alert:  Javier Glazier, RN  MD notified (1st page):  Elergawy  Time of first page:  [phone]  MD notified (2nd page):  Time of second page:  Responding MD:  Elergawy  Time MD responded:  [phone]

## 2016-07-10 DIAGNOSIS — A045 Campylobacter enteritis: Principal | ICD-10-CM

## 2016-07-10 LAB — GLUCOSE, CAPILLARY
GLUCOSE-CAPILLARY: 131 mg/dL — AB (ref 65–99)
Glucose-Capillary: 144 mg/dL — ABNORMAL HIGH (ref 65–99)

## 2016-07-10 LAB — BASIC METABOLIC PANEL
Anion gap: 6 (ref 5–15)
BUN: 21 mg/dL — AB (ref 6–20)
CO2: 17 mmol/L — ABNORMAL LOW (ref 22–32)
CREATININE: 0.74 mg/dL (ref 0.44–1.00)
Calcium: 7.8 mg/dL — ABNORMAL LOW (ref 8.9–10.3)
Chloride: 115 mmol/L — ABNORMAL HIGH (ref 101–111)
GFR calc Af Amer: >60 mL/min (ref >60)
Glucose, Bld: 136 mg/dL — ABNORMAL HIGH (ref 65–99)
Potassium: 3.4 mmol/L — ABNORMAL LOW (ref 3.5–5.1)
Sodium: 138 mmol/L (ref 135–145)

## 2016-07-10 LAB — CBC
HCT: 30.8 % — ABNORMAL LOW (ref 36.0–46.0)
Hemoglobin: 10.3 g/dL — ABNORMAL LOW (ref 12.0–15.0)
MCH: 28 pg (ref 26.0–34.0)
MCHC: 33.4 g/dL (ref 30.0–36.0)
MCV: 83.7 fL (ref 78.0–100.0)
PLATELETS: 66 10*3/uL — AB (ref 150–400)
RBC: 3.68 MIL/uL — AB (ref 3.87–5.11)
RDW: 14.2 % (ref 11.5–15.5)
WBC: 3.4 10*3/uL — ABNORMAL LOW (ref 4.0–10.5)

## 2016-07-10 MED ORDER — LEVOFLOXACIN IN D5W 750 MG/150ML IV SOLN
750.0000 mg | INTRAVENOUS | Status: DC
  Administered 2016-07-10 – 2016-07-11 (×2): 750 mg via INTRAVENOUS
  Filled 2016-07-10 (×3): qty 150

## 2016-07-10 MED ORDER — METOPROLOL TARTRATE 50 MG PO TABS
50.0000 mg | ORAL_TABLET | Freq: Two times a day (BID) | ORAL | Status: DC
  Administered 2016-07-10 – 2016-07-12 (×6): 50 mg via ORAL
  Filled 2016-07-10 (×4): qty 1
  Filled 2016-07-10: qty 2
  Filled 2016-07-10: qty 1

## 2016-07-10 MED ORDER — POTASSIUM CHLORIDE CRYS ER 20 MEQ PO TBCR
30.0000 meq | EXTENDED_RELEASE_TABLET | Freq: Four times a day (QID) | ORAL | Status: AC
  Administered 2016-07-10 (×2): 30 meq via ORAL
  Filled 2016-07-10 (×2): qty 1

## 2016-07-10 NOTE — Progress Notes (Signed)
Per infectious disease, patient does not have to be on enteric precautions unless incontinent or there is the use of diapers. Enteric precautions will be discontinued per protocol.

## 2016-07-10 NOTE — Progress Notes (Signed)
PROGRESS NOTE                                                                                                                                                                                                             Patient Demographics:    Becky Fields, is a 71 y.o. female, DOB - 1945/09/12, [id number]  Admit date - 07/08/2016   Admitting Physician Mauricio Gerome Apley, MD  Outpatient Primary MD for the patient is Zigmund Gottron, MD  LOS - 2  Chief Complaint  Patient presents with  . Diarrhea       Brief Narrative   71 y.o. female with medical history significant of diabetes presentes with profuse diarrhea , GI panel significant for Campylobacter.   Subjective:    Becky Fields today has, No headache, No chest pain, No abdominal pain - Reports she is feeling better, And appetite is improving , no abdominal pain , reports diarrhea still frequent, watery , but amount is less .   Assessment  & Plan :    Active Problems:   Diarrhea   Hypokalemia   Hypotension  Campylobacter Diarrhea  - This is due to infectious etiology , GI panel growing Campylobacter , abdominal x-ray significant for gastroenteritis , C. difficile negative , started on IV levofloxacin , continue that he fluids, when necessary nausea medication . - Continue with IV fluids   Hypotension.  - Suspected to be due to volume depletion From diarrhea. - Improving with IV fluids. - Continue to hold antihypertensive medication , will resume on metoprolol today.  Acute kidney injury - Due to volume loss due to diarrhea, complicated by poor oral intake.  - Resolved with IV fluids   Hypokalemia - due to diarrhea, depleted, recheck in a.m. with magnesium and phosphorus  Hypertension - Medication on hold on admission secondary to hypotension. - Blood pressure started to increase today, as well has mild tachycardia, and multiple PVCs, so will resume back on  metoprolol.  Diabetes mellitus type 2.  - Will hold on oral hypoglycemic agents, glimepiride, Januvia and metformin.  - CBGs are controlled on insulin sliding scale for glucose coverage and monitoring.   Acute hypoxic respiratory failure - Patient saturating in the 80s on room air on admission, on 4 L nasal cannula, chest x-ray significant for interstitial pneumonitis , patient is improving, currently  on room air   Thrombocytopenia - Most likely related to infection, continue to monitor  Code Status : Full  Family Communication  : none at bedside  Disposition Plan  : pending further work up.  Consults  :  None  Procedures  : None  DVT Prophylaxis  :  Lovenox held secondary to thrombocytopenia, on SCD  Lab Results  Component Value Date   PLT 66 (L) 07/10/2016    Antibiotics  :   Anti-infectives    Start     Dose/Rate Route Frequency Ordered Stop   07/09/16 1700  levofloxacin (LEVAQUIN) IVPB 750 mg     750 mg 100 mL/hr over 90 Minutes Intravenous Every 48 hours 07/09/16 1617     07/09/16 1600  ciprofloxacin (CIPRO) IVPB 400 mg  Status:  Discontinued     400 mg 200 mL/hr over 60 Minutes Intravenous Every 12 hours 07/09/16 1443 07/09/16 1557   07/09/16 1600  metroNIDAZOLE (FLAGYL) tablet 500 mg  Status:  Discontinued     500 mg Oral Every 8 hours 07/09/16 1443 07/09/16 1557        Objective:   Vitals:   07/10/16 0700 07/10/16 0815 07/10/16 0900 07/10/16 1000  BP:  (!) 159/71    Pulse: 95 99 (!) 102 (!) 114  Resp: 18 20 (!) 23 16  Temp:  98.9 F (37.2 C)    TempSrc:  Oral    SpO2: 97% 99% 98% 99%  Weight:      Height:        Wt Readings from Last 3 Encounters:  07/09/16 74.8 kg (165 lb)  06/08/16 74.8 kg (165 lb)  04/02/16 76.7 kg (169 lb)     Intake/Output Summary (Last 24 hours) at 07/10/16 1106 Last data filed at 07/09/16 2000  Gross per 24 hour  Intake           983.33 ml  Output                0 ml  Net           983.33 ml     Physical  Exam  Awake Alert, Oriented X 3, Frail, ill-appearing Supple Neck,No JVD Symmetrical Chest wall movement, Good air movement bilaterally, CTAB RRR,No Gallops,Rubs or new Murmurs, No Parasternal Heave +ve B.Sounds, Abd Soft, No tenderness, No rebound - guarding or rigidity. No Cyanosis, Clubbing or edema, No new Rash or bruise      Data Review:    CBC  Recent Labs Lab 07/08/16 0558 07/08/16 1343 07/09/16 0336 07/10/16 0350  WBC 13.8* 8.6 5.3 3.4*  HGB 12.7 11.8* 10.9* 10.3*  HCT 37.8 35.6* 33.3* 30.8*  PLT 121* 89* 68* 66*  MCV 84.6 85.6 85.4 83.7  MCH 28.4 28.4 27.9 28.0  MCHC 33.6 33.1 32.7 33.4  RDW 14.3 14.4 14.5 14.2    Chemistries   Recent Labs Lab 07/08/16 0558 07/08/16 1343 07/09/16 0336 07/10/16 0350  NA 131* 132* 136 138  K 2.6* 3.4* 3.3* 3.4*  CL 102 109 112* 115*  CO2 17* 16* 17* 17*  GLUCOSE 193* 125* 146* 136*  BUN 38* 36* 32* 21*  CREATININE 1.84* 1.70* 1.25* 0.74  CALCIUM 8.5* 7.3* 7.5* 7.8*  MG  --  1.7  --   --   AST 26  --  25  --   ALT 13*  --  15  --   ALKPHOS 57  --  41  --   BILITOT 0.7  --  0.8  --    ------------------------------------------------------------------------------------------------------------------ No results for input(s): CHOL, HDL, LDLCALC, TRIG, CHOLHDL, LDLDIRECT in the last 72 hours.  Lab Results  Component Value Date   HGBA1C 5.9 04/02/2016   ------------------------------------------------------------------------------------------------------------------ No results for input(s): TSH, T4TOTAL, T3FREE, THYROIDAB in the last 72 hours.  Invalid input(s): FREET3 ------------------------------------------------------------------------------------------------------------------ No results for input(s): VITAMINB12, FOLATE, FERRITIN, TIBC, IRON, RETICCTPCT in the last 72 hours.  Coagulation profile No results for input(s): INR, PROTIME in the last 168 hours.  No results for input(s): DDIMER in the last 72  hours.  Cardiac Enzymes No results for input(s): CKMB, TROPONINI, MYOGLOBIN in the last 168 hours.  Invalid input(s): CK ------------------------------------------------------------------------------------------------------------------ No results found for: BNP  Inpatient Medications  Scheduled Meds: . aspirin EC  81 mg Oral Daily  . fluticasone  2 spray Each Nare Daily  . insulin aspart  0-9 Units Subcutaneous TID WC  . levofloxacin (LEVAQUIN) IV  750 mg Intravenous Q48H  . multivitamin with minerals  1 tablet Oral Daily  . potassium chloride  30 mEq Oral Q6H  . saccharomyces boulardii  250 mg Oral BID  . sodium chloride flush  3 mL Intravenous Q12H   Continuous Infusions: . sodium chloride 75 mL/hr at 07/09/16 1652   PRN Meds:.acetaminophen **OR** acetaminophen, ondansetron **OR** ondansetron (ZOFRAN) IV, polyvinyl alcohol  Micro Results Recent Results (from the past 240 hour(s))  MRSA PCR Screening     Status: None   Collection Time: 07/08/16 12:25 PM  Result Value Ref Range Status   MRSA by PCR NEGATIVE NEGATIVE Final    Comment:        The GeneXpert MRSA Assay (FDA approved for NASAL specimens only), is one component of a comprehensive MRSA colonization surveillance program. It is not intended to diagnose MRSA infection nor to guide or monitor treatment for MRSA infections.   Gastrointestinal Panel by PCR , Stool     Status: Abnormal   Collection Time: 07/09/16  8:11 AM  Result Value Ref Range Status   Campylobacter species DETECTED (A) NOT DETECTED Final    Comment: CRITICAL RESULT CALLED TO, READ BACK BY AND VERIFIED WITH: CAROLINE CREECH AT 1550 ON 07/09/2016 JLJ    Plesimonas shigelloides NOT DETECTED NOT DETECTED Final   Salmonella species NOT DETECTED NOT DETECTED Final   Yersinia enterocolitica NOT DETECTED NOT DETECTED Final   Vibrio species NOT DETECTED NOT DETECTED Final   Vibrio cholerae NOT DETECTED NOT DETECTED Final   Enteroaggregative E coli  (EAEC) NOT DETECTED NOT DETECTED Final   Enteropathogenic E coli (EPEC) NOT DETECTED NOT DETECTED Final   Enterotoxigenic E coli (ETEC) NOT DETECTED NOT DETECTED Final   Shiga like toxin producing E coli (STEC) NOT DETECTED NOT DETECTED Final   Shigella/Enteroinvasive E coli (EIEC) NOT DETECTED NOT DETECTED Final   Cryptosporidium NOT DETECTED NOT DETECTED Final   Cyclospora cayetanensis NOT DETECTED NOT DETECTED Final   Entamoeba histolytica NOT DETECTED NOT DETECTED Final   Giardia lamblia NOT DETECTED NOT DETECTED Final   Adenovirus F40/41 NOT DETECTED NOT DETECTED Final   Astrovirus NOT DETECTED NOT DETECTED Final   Norovirus GI/GII NOT DETECTED NOT DETECTED Final   Rotavirus A NOT DETECTED NOT DETECTED Final   Sapovirus (I, II, IV, and V) NOT DETECTED NOT DETECTED Final  C difficile quick scan w PCR reflex     Status: None   Collection Time: 07/09/16  8:11 AM  Result Value Ref Range Status   C Diff antigen NEGATIVE NEGATIVE Final  C Diff toxin NEGATIVE NEGATIVE Final   C Diff interpretation No C. difficile detected.  Final    Radiology Reports Dg Chest Port 1 View  Result Date: 07/09/2016 CLINICAL DATA:  Significant diarrhea for the past 48 hours. History of hypertension and diabetes. EXAM: PORTABLE CHEST 1 VIEW COMPARISON:  09/13/2013. FINDINGS: The cardiac silhouette remains borderline enlarged. Increased prominence of the interstitial markings in both lungs. No pleural fluid. Thoracic spine degenerative changes. Diffuse osteopenia. Mild left shoulder degenerative changes. IMPRESSION: 1. Interval diffuse bilateral interstitial lung disease. This has an appearance suspicious for interstitial pneumonitis. This is not the typical appearance for interstitial pulmonary edema. 2. Stable borderline cardiomegaly. Electronically Signed   By: Claudie Revering M.D.   On: 07/09/2016 15:38   Dg Abd Portable 2v  Result Date: 07/09/2016 CLINICAL DATA:  Significant diarrhea for the past 48 hours.  EXAM: PORTABLE ABDOMEN - 2 VIEW COMPARISON:  None. FINDINGS: Scattered air-fluid levels in normal caliber loops of colon and small bowel. No free peritoneal air. Cholecystectomy clips. Lumbar and lower thoracic spine degenerative changes. IMPRESSION: Scattered air-fluid levels in normal caliber loops of colon and small bowel. This can be seen with gastroenteritis. Electronically Signed   By: Claudie Revering M.D.   On: 07/09/2016 15:39   Mm Digital Screening Bilateral  Result Date: 06/18/2016 CLINICAL DATA:  Screening. EXAM: DIGITAL SCREENING BILATERAL MAMMOGRAM WITH CAD COMPARISON:  Previous exam(s). ACR Breast Density Category b: There are scattered areas of fibroglandular density. FINDINGS: There are no findings suspicious for malignancy. Images were processed with CAD. IMPRESSION: No mammographic evidence of malignancy. A result letter of this screening mammogram will be mailed directly to the patient. RECOMMENDATION: Screening mammogram in one year. (Code:SM-B-01Y) BI-RADS CATEGORY  1: Negative. Electronically Signed   By: Ammie Ferrier M.D.   On: 06/18/2016 16:36   US Abdomen Limited Ruq  Result Date: 06/23/2016 CLINICAL DATA:  Hepatitis-C.  Cirrhosis. EXAM: US ABDOMEN LIMITED - RIGHT UPPER QUADRANT COMPARISON:  12/26/2015 FINDINGS: Gallbladder: Removed Common bile duct: Diameter: 3.1 mm, normal. Liver: Coarse echotexture without focal lesions. The appearance is essentially unchanged since the prior exam. IMPRESSION: No focal lesions in the liver. Unchanged coarse echotexture consistent with the history of cirrhosis. Electronically Signed   By: Lorriane Shire M.D.   On: 06/23/2016 08:43     Valon Glasscock M.D on 07/10/2016 at 11:06 AM  Between 7am to 7pm - Pager - [phone]  After 7pm go to [email or web address] - password [facility]  [facility] -  Office  [phone]

## 2016-07-10 NOTE — Progress Notes (Signed)
Pharmacy Antibiotic Note  Becky Fields is a 71 y.o. female admitted on 07/08/2016 with intra-abdominal campylobacter colitis.  Pharmacy has been consulted for levaquin dosing.  Day #1 antibiotics -SCr improved  Plan: - change levaquin to 750mg  IV q24h for CrCl > 50 ml/min - Follow renal function - Pharmacy to follow at distance  Height: 5\' 2"  (157.5 cm) Weight: 165 lb (74.8 kg) IBW/kg (Calculated) : 50.1  Temp (24hrs), Avg:99.3 F (37.4 C), Min:98.4 F (36.9 C), Max:100.9 F (38.3 C)   Recent Labs Lab 07/08/16 0558 07/08/16 1343 07/09/16 0336 07/10/16 0350  WBC 13.8* 8.6 5.3 3.4*  CREATININE 1.84* 1.70* 1.25* 0.74    Estimated Creatinine Clearance: 62.9 mL/min (by C-G formula based on SCr of 0.74 mg/dL).    Allergies  Allergen Reactions  . Lisinopril     REACTION: causes cough  . Sulfa Antibiotics     Told as a child allergic    Antimicrobials this admission: 1/18 levaquin >>   Microbiology results:  1/18 GI PCR: campylobacter species 1/18 CDiff PCR: neg 1/17 MRSA PCR: neg  Thank you for allowing pharmacy to be a part of this patient's care.  Doreene Eland, PharmD, BCPS.   Pager: [phone] 07/10/2016 11:22 AM

## 2016-07-11 ENCOUNTER — Encounter (HOSPITAL_COMMUNITY): Payer: Self-pay

## 2016-07-11 LAB — CBC
HEMATOCRIT: 28.5 % — AB (ref 36.0–46.0)
Hemoglobin: 9.8 g/dL — ABNORMAL LOW (ref 12.0–15.0)
MCH: 28.2 pg (ref 26.0–34.0)
MCHC: 34.4 g/dL (ref 30.0–36.0)
MCV: 82.1 fL (ref 78.0–100.0)
Platelets: 75 10*3/uL — ABNORMAL LOW (ref 150–400)
RBC: 3.47 MIL/uL — AB (ref 3.87–5.11)
RDW: 14.2 % (ref 11.5–15.5)
WBC: 4.4 10*3/uL (ref 4.0–10.5)

## 2016-07-11 LAB — GLUCOSE, CAPILLARY
GLUCOSE-CAPILLARY: 168 mg/dL — AB (ref 65–99)
GLUCOSE-CAPILLARY: 175 mg/dL — AB (ref 65–99)
GLUCOSE-CAPILLARY: 140 mg/dL — AB (ref 65–99)
Glucose-Capillary: 117 mg/dL — ABNORMAL HIGH (ref 65–99)
Glucose-Capillary: 169 mg/dL — ABNORMAL HIGH (ref 65–99)

## 2016-07-11 LAB — BASIC METABOLIC PANEL
ANION GAP: 4 — AB (ref 5–15)
BUN: 16 mg/dL (ref 6–20)
CALCIUM: 7.6 mg/dL — AB (ref 8.9–10.3)
CO2: 18 mmol/L — AB (ref 22–32)
Chloride: 116 mmol/L — ABNORMAL HIGH (ref 101–111)
Creatinine, Ser: 0.77 mg/dL (ref 0.44–1.00)
GLUCOSE: 131 mg/dL — AB (ref 65–99)
POTASSIUM: 3.3 mmol/L — AB (ref 3.5–5.1)
Sodium: 138 mmol/L (ref 135–145)

## 2016-07-11 LAB — MAGNESIUM: Magnesium: 1.3 mg/dL — ABNORMAL LOW (ref 1.7–2.4)

## 2016-07-11 LAB — PHOSPHORUS

## 2016-07-11 MED ORDER — ENSURE ENLIVE PO LIQD
237.0000 mL | Freq: Two times a day (BID) | ORAL | Status: DC
  Administered 2016-07-11: 237 mL via ORAL

## 2016-07-11 MED ORDER — MAGNESIUM SULFATE 4 GM/100ML IV SOLN
4.0000 g | Freq: Once | INTRAVENOUS | Status: AC
  Administered 2016-07-11: 4 g via INTRAVENOUS
  Filled 2016-07-11: qty 100

## 2016-07-11 MED ORDER — POTASSIUM CHLORIDE CRYS ER 20 MEQ PO TBCR
40.0000 meq | EXTENDED_RELEASE_TABLET | Freq: Four times a day (QID) | ORAL | Status: AC
  Administered 2016-07-11 (×3): 40 meq via ORAL
  Filled 2016-07-11 (×3): qty 2

## 2016-07-11 MED ORDER — K PHOS MONO-SOD PHOS DI & MONO 155-852-130 MG PO TABS
500.0000 mg | ORAL_TABLET | Freq: Three times a day (TID) | ORAL | Status: DC
  Administered 2016-07-11 – 2016-07-12 (×6): 500 mg via ORAL
  Filled 2016-07-11 (×7): qty 2

## 2016-07-11 MED ORDER — SODIUM PHOSPHATES 45 MMOLE/15ML IV SOLN
45.0000 mmol | Freq: Once | INTRAVENOUS | Status: AC
  Administered 2016-07-11: 45 mmol via INTRAVENOUS
  Filled 2016-07-11: qty 15

## 2016-07-11 NOTE — Progress Notes (Signed)
PROGRESS NOTE                                                                                                                                                                                                             Patient Demographics:    Becky Fields, is a 71 y.o. female, DOB - 1945-10-19, [id number]  Admit date - 07/08/2016   Admitting Physician Mauricio Annett Gula, MD  Outpatient Primary MD for the patient is Sanjuana Letters, MD  LOS - 3  Chief Complaint  Patient presents with  . Diarrhea       Brief Narrative   71 y.o. female with medical history significant of diabetes presentes with profuse diarrhea , GI panel significant for Campylobacter.   Subjective:    Becky Fields today has, No headache, No chest pain, No abdominal pain - Reports she is feeling better, And appetite is improving , no abdominal pain , reports diarrhea still frequent,more formed today , but amount is less .   Assessment  & Plan :    Active Problems:   Diarrhea   Hypokalemia   Hypotension  Campylobacter Diarrhea  - This is due to infectious etiology , GI panel growing Campylobacter , abdominal x-ray significant for gastroenteritis , C. difficile negative , continue IV levofloxacin , continue that he fluids, when necessary nausea medication .  Hypotension.  - Suspected to be due to volume depletion From diarrhea. - Resolved with IV fluids  Acute kidney injury - Due to volume loss due to diarrhea, complicated by poor oral intake.  - Resolved with IV fluids   Hypokalemia/hypomagnesemia/hypophosphatemia - due to diarrhea, repleted, recheck in a.m.  Hypertension - Medication on hold on admission secondary to hypotension. -  back on metoprolol.  Diabetes mellitus type 2.  - Will hold on oral hypoglycemic agents, glimepiride, Januvia and metformin.  - CBGs are controlled on insulin sliding scale for glucose coverage and monitoring.   Acute  hypoxic respiratory failure - Patient saturating in the 80s on room air on admission, on 4 L nasal cannula, chest x-ray significant for interstitial pneumonitis , patient is improving, currently on room air   Thrombocytopenia - Most likely related to infection, continue to monitor  Code Status : Full  Family Communication  : none at bedside  Disposition Plan  : Home in 1-2 days  Consults  :  None  Procedures  : None  DVT Prophylaxis  :  Lovenox held secondary to thrombocytopenia, on SCD  Lab Results  Component Value Date   PLT 75 (L) 07/11/2016    Antibiotics  :   Anti-infectives    Start     Dose/Rate Route Frequency Ordered Stop   07/10/16 1600  levofloxacin (LEVAQUIN) IVPB 750 mg     750 mg 100 mL/hr over 90 Minutes Intravenous Every 24 hours 07/10/16 1120     07/09/16 1700  levofloxacin (LEVAQUIN) IVPB 750 mg  Status:  Discontinued     750 mg 100 mL/hr over 90 Minutes Intravenous Every 48 hours 07/09/16 1617 07/10/16 1120   07/09/16 1600  ciprofloxacin (CIPRO) IVPB 400 mg  Status:  Discontinued     400 mg 200 mL/hr over 60 Minutes Intravenous Every 12 hours 07/09/16 1443 07/09/16 1557   07/09/16 1600  metroNIDAZOLE (FLAGYL) tablet 500 mg  Status:  Discontinued     500 mg Oral Every 8 hours 07/09/16 1443 07/09/16 1557        Objective:   Vitals:   07/10/16 1744 07/10/16 2047 07/11/16 0616 07/11/16 0935  BP: 132/66 (!) 118/44 128/64 (!) 141/69  Pulse: 86 92 90 92  Resp: 18 20 20    Temp: 99.2 F (37.3 C) 98.6 F (37 C) 99.4 F (37.4 C)   TempSrc: Oral Oral Oral   SpO2: 96% 97% 97%   Weight:      Height:        Wt Readings from Last 3 Encounters:  07/09/16 74.8 kg (165 lb)  06/08/16 74.8 kg (165 lb)  04/02/16 76.7 kg (169 lb)     Intake/Output Summary (Last 24 hours) at 07/11/16 1349 Last data filed at 07/11/16 0745  Gross per 24 hour  Intake          1168.33 ml  Output                0 ml  Net          1168.33 ml     Physical Exam  Awake  Alert, Oriented X 3, Frail, ill-appearing Supple Neck,No JVD Symmetrical Chest wall movement, Good air movement bilaterally, CTAB RRR,No Gallops,Rubs or new Murmurs, No Parasternal Heave +ve B.Sounds, Abd Soft, No tenderness, No rebound - guarding or rigidity. No Cyanosis, Clubbing or edema, No new Rash or bruise      Data Review:    CBC  Recent Labs Lab 07/08/16 0558 07/08/16 1343 07/09/16 0336 07/10/16 0350 07/11/16 0530  WBC 13.8* 8.6 5.3 3.4* 4.4  HGB 12.7 11.8* 10.9* 10.3* 9.8*  HCT 37.8 35.6* 33.3* 30.8* 28.5*  PLT 121* 89* 68* 66* 75*  MCV 84.6 85.6 85.4 83.7 82.1  MCH 28.4 28.4 27.9 28.0 28.2  MCHC 33.6 33.1 32.7 33.4 34.4  RDW 14.3 14.4 14.5 14.2 14.2    Chemistries   Recent Labs Lab 07/08/16 0558 07/08/16 1343 07/09/16 0336 07/10/16 0350 07/11/16 0530  NA 131* 132* 136 138 138  K 2.6* 3.4* 3.3* 3.4* 3.3*  CL 102 109 112* 115* 116*  CO2 17* 16* 17* 17* 18*  GLUCOSE 193* 125* 146* 136* 131*  BUN 38* 36* 32* 21* 16  CREATININE 1.84* 1.70* 1.25* 0.74 0.77  CALCIUM 8.5* 7.3* 7.5* 7.8* 7.6*  MG  --  1.7  --   --  1.3*  AST 26  --  25  --   --   ALT 13*  --  15  --   --   ALKPHOS 57  --  41  --   --   BILITOT 0.7  --  0.8  --   --    ------------------------------------------------------------------------------------------------------------------ No results for input(s): CHOL, HDL, LDLCALC, TRIG, CHOLHDL, LDLDIRECT in the last 72 hours.  Lab Results  Component Value Date   HGBA1C 5.9 04/02/2016   ------------------------------------------------------------------------------------------------------------------ No results for input(s): TSH, T4TOTAL, T3FREE, THYROIDAB in the last 72 hours.  Invalid input(s): FREET3 ------------------------------------------------------------------------------------------------------------------ No results for input(s): VITAMINB12, FOLATE, FERRITIN, TIBC, IRON, RETICCTPCT in the last 72 hours.  Coagulation profile No  results for input(s): INR, PROTIME in the last 168 hours.  No results for input(s): DDIMER in the last 72 hours.  Cardiac Enzymes No results for input(s): CKMB, TROPONINI, MYOGLOBIN in the last 168 hours.  Invalid input(s): CK ------------------------------------------------------------------------------------------------------------------ No results found for: BNP  Inpatient Medications  Scheduled Meds: . aspirin EC  81 mg Oral Daily  . feeding supplement (ENSURE ENLIVE)  237 mL Oral BID BM  . fluticasone  2 spray Each Nare Daily  . insulin aspart  0-9 Units Subcutaneous TID WC  . levofloxacin (LEVAQUIN) IV  750 mg Intravenous Q24H  . metoprolol  50 mg Oral BID  . multivitamin with minerals  1 tablet Oral Daily  . phosphorus  500 mg Oral TID  . potassium chloride  40 mEq Oral Q6H  . saccharomyces boulardii  250 mg Oral BID  . sodium chloride flush  3 mL Intravenous Q12H  . sodium phosphate  Dextrose 5% IVPB  45 mmol Intravenous Once   Continuous Infusions:  PRN Meds:.acetaminophen **OR** acetaminophen, ondansetron **OR** ondansetron (ZOFRAN) IV, polyvinyl alcohol  Micro Results Recent Results (from the past 240 hour(s))  MRSA PCR Screening     Status: None   Collection Time: 07/08/16 12:25 PM  Result Value Ref Range Status   MRSA by PCR NEGATIVE NEGATIVE Final    Comment:        The GeneXpert MRSA Assay (FDA approved for NASAL specimens only), is one component of a comprehensive MRSA colonization surveillance program. It is not intended to diagnose MRSA infection nor to guide or monitor treatment for MRSA infections.   Gastrointestinal Panel by PCR , Stool     Status: Abnormal   Collection Time: 07/09/16  8:11 AM  Result Value Ref Range Status   Campylobacter species DETECTED (A) NOT DETECTED Final    Comment: CRITICAL RESULT CALLED TO, READ BACK BY AND VERIFIED WITH: CAROLINE CREECH AT 1550 ON 07/09/2016 JLJ    Plesimonas shigelloides NOT DETECTED NOT DETECTED  Final   Salmonella species NOT DETECTED NOT DETECTED Final   Yersinia enterocolitica NOT DETECTED NOT DETECTED Final   Vibrio species NOT DETECTED NOT DETECTED Final   Vibrio cholerae NOT DETECTED NOT DETECTED Final   Enteroaggregative E coli (EAEC) NOT DETECTED NOT DETECTED Final   Enteropathogenic E coli (EPEC) NOT DETECTED NOT DETECTED Final   Enterotoxigenic E coli (ETEC) NOT DETECTED NOT DETECTED Final   Shiga like toxin producing E coli (STEC) NOT DETECTED NOT DETECTED Final   Shigella/Enteroinvasive E coli (EIEC) NOT DETECTED NOT DETECTED Final   Cryptosporidium NOT DETECTED NOT DETECTED Final   Cyclospora cayetanensis NOT DETECTED NOT DETECTED Final   Entamoeba histolytica NOT DETECTED NOT DETECTED Final   Giardia lamblia NOT DETECTED NOT DETECTED Final   Adenovirus F40/41 NOT DETECTED NOT DETECTED Final   Astrovirus NOT DETECTED NOT DETECTED Final   Norovirus GI/GII NOT DETECTED NOT DETECTED  Final   Rotavirus A NOT DETECTED NOT DETECTED Final   Sapovirus (I, II, IV, and V) NOT DETECTED NOT DETECTED Final  C difficile quick scan w PCR reflex     Status: None   Collection Time: 07/09/16  8:11 AM  Result Value Ref Range Status   C Diff antigen NEGATIVE NEGATIVE Final   C Diff toxin NEGATIVE NEGATIVE Final   C Diff interpretation No C. difficile detected.  Final    Radiology Reports Dg Chest Port 1 View  Result Date: 07/09/2016 CLINICAL DATA:  Significant diarrhea for the past 48 hours. History of hypertension and diabetes. EXAM: PORTABLE CHEST 1 VIEW COMPARISON:  09/13/2013. FINDINGS: The cardiac silhouette remains borderline enlarged. Increased prominence of the interstitial markings in both lungs. No pleural fluid. Thoracic spine degenerative changes. Diffuse osteopenia. Mild left shoulder degenerative changes. IMPRESSION: 1. Interval diffuse bilateral interstitial lung disease. This has an appearance suspicious for interstitial pneumonitis. This is not the typical appearance  for interstitial pulmonary edema. 2. Stable borderline cardiomegaly. Electronically Signed   By: Beckie Salts M.D.   On: 07/09/2016 15:38   Dg Abd Portable 2v  Result Date: 07/09/2016 CLINICAL DATA:  Significant diarrhea for the past 48 hours. EXAM: PORTABLE ABDOMEN - 2 VIEW COMPARISON:  None. FINDINGS: Scattered air-fluid levels in normal caliber loops of colon and small bowel. No free peritoneal air. Cholecystectomy clips. Lumbar and lower thoracic spine degenerative changes. IMPRESSION: Scattered air-fluid levels in normal caliber loops of colon and small bowel. This can be seen with gastroenteritis. Electronically Signed   By: Beckie Salts M.D.   On: 07/09/2016 15:39   Mm Digital Screening Bilateral  Result Date: 06/18/2016 CLINICAL DATA:  Screening. EXAM: DIGITAL SCREENING BILATERAL MAMMOGRAM WITH CAD COMPARISON:  Previous exam(s). ACR Breast Density Category b: There are scattered areas of fibroglandular density. FINDINGS: There are no findings suspicious for malignancy. Images were processed with CAD. IMPRESSION: No mammographic evidence of malignancy. A result letter of this screening mammogram will be mailed directly to the patient. RECOMMENDATION: Screening mammogram in one year. (Code:SM-B-01Y) BI-RADS CATEGORY  1: Negative. Electronically Signed   By: Frederico Hamman M.D.   On: 06/18/2016 16:36   US Abdomen Limited Ruq  Result Date: 06/23/2016 CLINICAL DATA:  Hepatitis-C.  Cirrhosis. EXAM: US ABDOMEN LIMITED - RIGHT UPPER QUADRANT COMPARISON:  12/26/2015 FINDINGS: Gallbladder: Removed Common bile duct: Diameter: 3.1 mm, normal. Liver: Coarse echotexture without focal lesions. The appearance is essentially unchanged since the prior exam. IMPRESSION: No focal lesions in the liver. Unchanged coarse echotexture consistent with the history of cirrhosis. Electronically Signed   By: Francene Boyers M.D.   On: 06/23/2016 08:43     Ivanell Deshotel M.D on 07/11/2016 at 1:49 PM  Between 7am to  7pm - Pager - [phone]  After 7pm go to [email or web address] - password [email or web address]  [facility] -  Office  [phone]

## 2016-07-11 NOTE — Evaluation (Signed)
Physical Therapy Evaluation Patient Details Name: Becky Fields MRN: [id number] DOB: 1946/02/04 Today's Date: 07/11/2016   History of Present Illness  HPI: Becky Fields is a 71 y.o. female with medical history significant of diabetes presentes with profuse diarrhea for the last 48 hours, persistent and worsening, severe in intensity, more than 10 episodes of watery diarrhea per day, associated with poor oral intake, fever, chills and weakness, no improving or worsening factors. This am patient fell from the bed, unable to stand due to severe weakness  Clinical Impression  The patient is mobilizing in the room. Encouraged that she is able to ambulate to bathroom. Encouraged patient to continue with ambulation. Pt admitted with above diagnosis. Pt currently with functional limitations due to the deficits listed below (see PT Problem List).  Pt will benefit from skilled PT to increase their independence and safety with mobility to allow discharge to the venue listed below.       Follow Up Recommendations No PT follow up    Equipment Recommendations  None recommended by PT    Recommendations for Other Services       Precautions / Restrictions Precautions Precautions: Fall      Mobility  Bed Mobility Overal bed mobility: Independent                Transfers Overall transfer level: Needs assistance Equipment used: Rolling walker (2 wheeled) Transfers: Sit to/from Stand Sit to Stand: Min assist         General transfer comment: from bed and  toilet, lifting assist from low toilet.   Ambulation/Gait Ambulation/Gait assistance: Min assist Ambulation Distance (Feet): 20 Feet Assistive device: Rolling walker (2 wheeled) Gait Pattern/deviations: Step-through pattern;Decreased stride length     General Gait Details: x 2 with RW, 40' x1 with IV pole and HHA.  Stairs            Wheelchair Mobility    Modified Rankin (Stroke Patients Only)       Balance Overall  balance assessment: Needs assistance         Standing balance support: During functional activity;Single extremity supported Standing balance-Leahy Scale: Good                               Pertinent Vitals/Pain Pain Assessment: No/denies pain    Home Living Family/patient expects to be discharged to:: Private residence Living Arrangements: Children Available Help at Discharge: Family Type of Home: House Home Access: Stairs to enter Entrance Stairs-Rails: Psychiatric nurse of Steps: 2 Home Layout: One level Home Equipment: None      Prior Function Level of Independence: Independent         Comments: works     Journalist, newspaper        Extremity/Trunk Assessment   Upper Extremity Assessment Upper Extremity Assessment: Generalized weakness    Lower Extremity Assessment Lower Extremity Assessment: Generalized weakness;RLE deficits/detail;LLE deficits/detail RLE Sensation: history of peripheral neuropathy LLE Sensation: history of peripheral neuropathy       Communication   Communication: No difficulties  Cognition Arousal/Alertness: Awake/alert Behavior During Therapy: WFL for tasks assessed/performed Overall Cognitive Status: Within Functional Limits for tasks assessed                      General Comments      Exercises     Assessment/Plan    PT Assessment Patient needs continued PT services  PT Problem List Decreased  strength;Decreased activity tolerance;Decreased mobility          PT Treatment Interventions Gait training;Functional mobility training    PT Goals (Current goals can be found in the Care Plan section)  Acute Rehab PT Goals Patient Stated Goal: to get better and go home PT Goal Formulation: With patient Time For Goal Achievement: 07/18/16 Potential to Achieve Goals: Good    Frequency Min 3X/week   Barriers to discharge        Co-evaluation               End of Session    Activity Tolerance: Patient tolerated treatment well Patient left: in bed;with call bell/phone within reach Nurse Communication: Mobility status         Time: [id number] PT Time Calculation (min) (ACUTE ONLY): 19 min   Charges:   PT Evaluation $PT Eval Low Complexity: 1 Procedure     PT G CodesClaretha Fields 07/11/2016, 12:30 PM

## 2016-07-11 NOTE — Progress Notes (Signed)
CRITICAL VALUE ALERT  Critical value received:  Phosphorus <1.0  Date of notification:  07/11/16  Time of notification:  0646  Critical value read back:Yes.    Nurse who received alert:  Ellen Henri RN  MD notified (1st page):  Seward Meth, MD  Time of first page:  0708  MD notified (2nd page):  Time of second page:  Responding MD:  None  Time MD responded:  None

## 2016-07-12 LAB — BASIC METABOLIC PANEL
Anion gap: 5 (ref 5–15)
BUN: 13 mg/dL (ref 6–20)
CHLORIDE: 114 mmol/L — AB (ref 101–111)
CO2: 20 mmol/L — AB (ref 22–32)
CREATININE: 0.5 mg/dL (ref 0.44–1.00)
Calcium: 7.3 mg/dL — ABNORMAL LOW (ref 8.9–10.3)
GFR calc non Af Amer: >60 mL/min (ref >60)
Glucose, Bld: 129 mg/dL — ABNORMAL HIGH (ref 65–99)
Potassium: 3.5 mmol/L (ref 3.5–5.1)
Sodium: 139 mmol/L (ref 135–145)

## 2016-07-12 LAB — CBC
HCT: 28.6 % — ABNORMAL LOW (ref 36.0–46.0)
Hemoglobin: 9.8 g/dL — ABNORMAL LOW (ref 12.0–15.0)
MCH: 28.1 pg (ref 26.0–34.0)
MCHC: 34.3 g/dL (ref 30.0–36.0)
MCV: 81.9 fL (ref 78.0–100.0)
PLATELETS: 86 10*3/uL — AB (ref 150–400)
RBC: 3.49 MIL/uL — AB (ref 3.87–5.11)
RDW: 14.3 % (ref 11.5–15.5)
WBC: 4.1 10*3/uL (ref 4.0–10.5)

## 2016-07-12 LAB — GLUCOSE, CAPILLARY
GLUCOSE-CAPILLARY: 123 mg/dL — AB (ref 65–99)
Glucose-Capillary: 114 mg/dL — ABNORMAL HIGH (ref 65–99)

## 2016-07-12 LAB — MAGNESIUM: Magnesium: 1.7 mg/dL (ref 1.7–2.4)

## 2016-07-12 LAB — PHOSPHORUS: Phosphorus: 1.5 mg/dL — ABNORMAL LOW (ref 2.5–4.6)

## 2016-07-12 MED ORDER — MAGNESIUM SULFATE 2 GM/50ML IV SOLN
2.0000 g | Freq: Once | INTRAVENOUS | Status: AC
  Administered 2016-07-12: 2 g via INTRAVENOUS
  Filled 2016-07-12: qty 50

## 2016-07-12 MED ORDER — POTASSIUM PHOSPHATES 15 MMOLE/5ML IV SOLN
45.0000 mmol | Freq: Once | INTRAVENOUS | Status: AC
  Administered 2016-07-12: 45 mmol via INTRAVENOUS
  Filled 2016-07-12: qty 15

## 2016-07-12 MED ORDER — LEVOFLOXACIN 750 MG PO TABS: 750.0000 mg | ORAL_TABLET | Freq: Every day | ORAL | 0 refills | Status: DC

## 2016-07-12 MED ORDER — ENSURE ENLIVE PO LIQD: 237.0000 mL | Freq: Two times a day (BID) | ORAL | 12 refills | Status: DC

## 2016-07-12 MED ORDER — K PHOS MONO-SOD PHOS DI & MONO 155-852-130 MG PO TABS: 500.0000 mg | ORAL_TABLET | Freq: Three times a day (TID) | ORAL | 0 refills | Status: DC

## 2016-07-12 MED ORDER — POTASSIUM CHLORIDE ER 10 MEQ PO TBCR: 20.0000 meq | EXTENDED_RELEASE_TABLET | Freq: Two times a day (BID) | ORAL | 0 refills | Status: DC

## 2016-07-12 MED ORDER — SACCHAROMYCES BOULARDII 250 MG PO CAPS
250.0000 mg | ORAL_CAPSULE | Freq: Two times a day (BID) | ORAL | 0 refills | Status: DC
Start: 2016-07-12 — End: 2016-07-16

## 2016-07-12 MED ORDER — HYDROCHLOROTHIAZIDE 12.5 MG PO TABS: 12.5000 mg | ORAL_TABLET | Freq: Every day | ORAL | 3 refills | Status: DC

## 2016-07-12 NOTE — Progress Notes (Signed)
RN transported patient in wheelchair to son's car which was waiting outside.

## 2016-07-12 NOTE — Discharge Summary (Signed)
Becky Fields, is a 71 y.o. female  DOB 06-24-1945  MRN [id number].  Admission date:  07/08/2016  Admitting Physician  Mauricio Annett Gula, MD  Discharge Date:  07/12/2016   Primary MD  Sanjuana Letters, MD  Recommendations for primary care physician for things to follow:  - please check CBC, BMP, phosphorus, magnesium during next visit and replete as needed.   Admission Diagnosis  Dehydration [E86.0] Hypokalemia [E87.6] Acute renal failure, unspecified acute renal failure type (HCC) [N17.9] Diarrhea, unspecified type [R19.7]   Discharge Diagnosis  Dehydration [E86.0] Hypokalemia [E87.6] Acute renal failure, unspecified acute renal failure type (HCC) [N17.9] Diarrhea, unspecified type [R19.7]    Active Problems:   Diarrhea   Hypokalemia   Hypotension      Past Medical History:  Diagnosis Date  . Cataract   . Diabetes mellitus   . Ejection fraction    .  Marland Kitchen Hepatitis C   . Hypertension   . Hypertriglyceridemia   . Takotsubo syndrome    October 16, 2012    Past Surgical History:  Procedure Laterality Date  . CHOLECYSTECTOMY    . COLONOSCOPY    . EYE SURGERY  02/2012   bilateral cataract surg  . LEFT HEART CATHETERIZATION WITH CORONARY ANGIOGRAM N/A 10/17/2012   Procedure: LEFT HEART CATHETERIZATION WITH CORONARY ANGIOGRAM;  Surgeon: Tonny Bollman, MD;  Location: [facility] CATH LAB;  Service: Cardiovascular;  Laterality: N/A;       History of present illness and  Hospital Course:     Kindly see H&P for history of present illness and admission details, please review complete Labs, Consult reports and Test reports for all details in brief  HPI  from the history and physical done on the day of admission 07/08/2016 HPI: Becky Fields is a 71 y.o. female with medical history significant of diabetes presentes with profuse diarrhea for the last 48 hours, persistent and worsening, severe in  intensity, more than 10 episodes of watery diarrhea per day, associated with poor oral intake, fever, chills and weakness, no improving or worsening factors. This am patient fell from the bed, unable to stand due to severe weakness. Her family brought her to the hospital for further evaluation.    Positive sick contact at work.  ED Course: Patient was evaluated, found to be dehydrated, positive electrolyte abnormalities, referred for further admission and evaluation.     Hospital Course  71 y.o.femalewith medical history significant of diabetes presentes with profuse diarrhea , GI panel significant for Campylobacter  Campylobacter Diarrhea  - This is due to infectious etiology , GI panel growing Campylobacter , abdominal x-ray significant for gastroenteritis , C. difficile negative , treated with IV fluids during hospital stay, IV levofloxacin, diarrhea is improving, becoming more formed and less frequent, will be discharged on another 3 days on oral levofloxacin, with instruction to continue with increased fluid intake.  , continue that he fluids, when necessary nausea medication .  Hypotension.  - Suspected to be due to volume depletion From diarrhea. - Resolved with IV  fluids  Acute kidney injury - Due to volume loss due to diarrhea, complicated by poor oral intake.  - Resolved with IV fluids   Hypokalemia/hypomagnesemia/hypophosphatemia - due to diarrhea, repleted, will be discharged on oral supplement for the next 5-7 days  Hypertension - Resume home medication on discharge, will continue to hold hydrochlorothiazide  Diabetes mellitus type 2.  - As you home medication on discharge  Acute hypoxic respiratory failure - Patient saturating in the 80s on room air on admission, on 4 L nasal cannula, chest x-ray significant for interstitial pneumonitis , patient is improving, currently on room air   Thrombocytopenia - Most likely related to infection, continue to monitor,  trending up gradually, platelet count is 80 6K on discharge     Discharge Condition:  Stable   Follow UP  Follow-up Information    Sanjuana Letters, MD Follow up in 1 week(s).   Specialty:  Family Medicine Contact information: [address] [place] [address] [phone]             Discharge Instructions  and  Discharge Medications     Discharge Instructions    Discharge instructions    Complete by:  As directed    Follow with Primary MD Sanjuana Letters, MD in 7 days   Get CBC, CMP,checked  by Primary MD next visit.    Activity: As tolerated with Full fall precautions use walker/cane & assistance as needed   Disposition Home    Diet: Heart Healthy /Carb modified, with feeding assistance and aspiration precautions.   On your next visit with your primary care physician please Get Medicines reviewed and adjusted.   Please request your Prim.MD to go over all Hospital Tests and Procedure/Radiological results at the follow up, please get all Hospital records sent to your Prim MD by signing hospital release before you go home.   If you experience worsening of your admission symptoms, develop shortness of breath, life threatening emergency, suicidal or homicidal thoughts you must seek medical attention immediately by calling 911 or calling your MD immediately  if symptoms less severe.  You Must read complete instructions/literature along with all the possible adverse reactions/side effects for all the Medicines you take and that have been prescribed to you. Take any new Medicines after you have completely understood and accpet all the possible adverse reactions/side effects.   Do not drive, operating heavy machinery, perform activities at heights, swimming or participation in water activities or provide baby sitting services if your were admitted for syncope or siezures until you have seen by Primary MD or a Neurologist and advised to do so  again.  Do not drive when taking Pain medications.    Do not take more than prescribed Pain, Sleep and Anxiety Medications  Special Instructions: If you have smoked or chewed Tobacco  in the last 2 yrs please stop smoking, stop any regular Alcohol  and or any Recreational drug use.  Wear Seat belts while driving.   Please note  You were cared for by a hospitalist during your hospital stay. If you have any questions about your discharge medications or the care you received while you were in the hospital after you are discharged, you can call the unit and asked to speak with the hospitalist on call if the hospitalist that took care of you is not available. Once you are discharged, your primary care physician will handle any further medical issues. Please note that NO REFILLS for any discharge medications will be authorized  once you are discharged, as it is imperative that you return to your primary care physician (or establish a relationship with a primary care physician if you do not have one) for your aftercare needs so that they can reassess your need for medications and monitor your lab values.   Increase activity slowly    Complete by:  As directed      Allergies as of 07/12/2016      Reactions   Lisinopril    REACTION: causes cough   Sulfa Antibiotics    Told as a child allergic      Medication List    TAKE these medications   aspirin 81 MG tablet Take 81 mg by mouth daily.   feeding supplement (ENSURE ENLIVE) Liqd Take 237 mLs by mouth 2 (two) times daily between meals.   fluticasone 50 MCG/ACT nasal spray Commonly known as:  FLONASE Place 2 sprays into both nostrils daily.   glimepiride 4 MG tablet Commonly known as:  AMARYL TAKE TWO TABLETS BY MOUTH ONCE DAILY BEFORE BREAKFAST.   hydrochlorothiazide 12.5 MG tablet Commonly known as:  HYDRODIURIL Take 1 tablet (12.5 mg total) by mouth daily. Start on 07/15/2016 Start taking on:  07/15/2016 What changed:  additional  instructions   JANUVIA 100 MG tablet Generic drug:  sitaGLIPtin TAKE ONE TABLET BY MOUTH ONCE DAILY   levofloxacin 750 MG tablet Commonly known as:  LEVAQUIN Take 1 tablet (750 mg total) by mouth daily. Start taking on:  07/13/2016   loratadine 10 MG tablet Commonly known as:  CLARITIN TAKE ONE TABLET BY MOUTH ONCE DAILY AS NEEDED FOR ALLERGY   losartan 100 MG tablet Commonly known as:  COZAAR TAKE ONE TABLET BY MOUTH ONCE DAILY   metFORMIN 500 MG tablet Commonly known as:  GLUCOPHAGE Take 1 tablet (500 mg total) by mouth 2 (two) times daily with a meal.   metoprolol 50 MG tablet Commonly known as:  LOPRESSOR TAKE ONE TABLET BY MOUTH TWICE DAILY   multivitamin with minerals Tabs tablet Take 1 tablet by mouth daily. Reported on 12/17/2015   phosphorus 155-852-130 MG tablet Commonly known as:  K PHOS NEUTRAL Take 2 tablets (500 mg total) by mouth 3 (three) times daily. Please take for 1 week.   potassium chloride 10 MEQ tablet Commonly known as:  K-DUR Take 2 tablets (20 mEq total) by mouth 2 (two) times daily.   saccharomyces boulardii 250 MG capsule Commonly known as:  FLORASTOR Take 1 capsule (250 mg total) by mouth 2 (two) times daily.   SYSTANE OP Apply 1 drop to eye 2 (two) times daily as needed (dry eyes). Reported on 09/05/2015         Diet and Activity recommendation: See Discharge Instructions above   Consults obtained -  none   Major procedures and Radiology Reports - PLEASE review detailed and final reports for all details, in brief -      Dg Chest Port 1 View  Result Date: 07/09/2016 CLINICAL DATA:  Significant diarrhea for the past 48 hours. History of hypertension and diabetes. EXAM: PORTABLE CHEST 1 VIEW COMPARISON:  09/13/2013. FINDINGS: The cardiac silhouette remains borderline enlarged. Increased prominence of the interstitial markings in both lungs. No pleural fluid. Thoracic spine degenerative changes. Diffuse osteopenia. Mild left  shoulder degenerative changes. IMPRESSION: 1. Interval diffuse bilateral interstitial lung disease. This has an appearance suspicious for interstitial pneumonitis. This is not the typical appearance for interstitial pulmonary edema. 2. Stable borderline cardiomegaly. Electronically Signed   By:  Beckie Salts M.D.   On: 07/09/2016 15:38   Dg Abd Portable 2v  Result Date: 07/09/2016 CLINICAL DATA:  Significant diarrhea for the past 48 hours. EXAM: PORTABLE ABDOMEN - 2 VIEW COMPARISON:  None. FINDINGS: Scattered air-fluid levels in normal caliber loops of colon and small bowel. No free peritoneal air. Cholecystectomy clips. Lumbar and lower thoracic spine degenerative changes. IMPRESSION: Scattered air-fluid levels in normal caliber loops of colon and small bowel. This can be seen with gastroenteritis. Electronically Signed   By: Beckie Salts M.D.   On: 07/09/2016 15:39   Mm Digital Screening Bilateral  Result Date: 06/18/2016 CLINICAL DATA:  Screening. EXAM: DIGITAL SCREENING BILATERAL MAMMOGRAM WITH CAD COMPARISON:  Previous exam(s). ACR Breast Density Category b: There are scattered areas of fibroglandular density. FINDINGS: There are no findings suspicious for malignancy. Images were processed with CAD. IMPRESSION: No mammographic evidence of malignancy. A result letter of this screening mammogram will be mailed directly to the patient. RECOMMENDATION: Screening mammogram in one year. (Code:SM-B-01Y) BI-RADS CATEGORY  1: Negative. Electronically Signed   By: Frederico Hamman M.D.   On: 06/18/2016 16:36   US Abdomen Limited Ruq  Result Date: 06/23/2016 CLINICAL DATA:  Hepatitis-C.  Cirrhosis. EXAM: US ABDOMEN LIMITED - RIGHT UPPER QUADRANT COMPARISON:  12/26/2015 FINDINGS: Gallbladder: Removed Common bile duct: Diameter: 3.1 mm, normal. Liver: Coarse echotexture without focal lesions. The appearance is essentially unchanged since the prior exam. IMPRESSION: No focal lesions in the liver. Unchanged coarse  echotexture consistent with the history of cirrhosis. Electronically Signed   By: Francene Boyers M.D.   On: 06/23/2016 08:43    Micro Results    Recent Results (from the past 240 hour(s))  MRSA PCR Screening     Status: None   Collection Time: 07/08/16 12:25 PM  Result Value Ref Range Status   MRSA by PCR NEGATIVE NEGATIVE Final    Comment:        The GeneXpert MRSA Assay (FDA approved for NASAL specimens only), is one component of a comprehensive MRSA colonization surveillance program. It is not intended to diagnose MRSA infection nor to guide or monitor treatment for MRSA infections.   Gastrointestinal Panel by PCR , Stool     Status: Abnormal   Collection Time: 07/09/16  8:11 AM  Result Value Ref Range Status   Campylobacter species DETECTED (A) NOT DETECTED Final    Comment: CRITICAL RESULT CALLED TO, READ BACK BY AND VERIFIED WITH: CAROLINE CREECH AT 1550 ON 07/09/2016 JLJ    Plesimonas shigelloides NOT DETECTED NOT DETECTED Final   Salmonella species NOT DETECTED NOT DETECTED Final   Yersinia enterocolitica NOT DETECTED NOT DETECTED Final   Vibrio species NOT DETECTED NOT DETECTED Final   Vibrio cholerae NOT DETECTED NOT DETECTED Final   Enteroaggregative E coli (EAEC) NOT DETECTED NOT DETECTED Final   Enteropathogenic E coli (EPEC) NOT DETECTED NOT DETECTED Final   Enterotoxigenic E coli (ETEC) NOT DETECTED NOT DETECTED Final   Shiga like toxin producing E coli (STEC) NOT DETECTED NOT DETECTED Final   Shigella/Enteroinvasive E coli (EIEC) NOT DETECTED NOT DETECTED Final   Cryptosporidium NOT DETECTED NOT DETECTED Final   Cyclospora cayetanensis NOT DETECTED NOT DETECTED Final   Entamoeba histolytica NOT DETECTED NOT DETECTED Final   Giardia lamblia NOT DETECTED NOT DETECTED Final   Adenovirus F40/41 NOT DETECTED NOT DETECTED Final   Astrovirus NOT DETECTED NOT DETECTED Final   Norovirus GI/GII NOT DETECTED NOT DETECTED Final   Rotavirus A NOT DETECTED NOT  DETECTED  Final   Sapovirus (I, II, IV, and V) NOT DETECTED NOT DETECTED Final  C difficile quick scan w PCR reflex     Status: None   Collection Time: 07/09/16  8:11 AM  Result Value Ref Range Status   C Diff antigen NEGATIVE NEGATIVE Final   C Diff toxin NEGATIVE NEGATIVE Final   C Diff interpretation No C. difficile detected.  Final       Today   Subjective:   Becky Fields today has no headache,no chest or abdominal pain, feels much better wants to go home today, diarrhea is improving, less frequent, smaller amount, becoming more formed  Objective:   Blood pressure (!) 120/53, pulse 75, temperature 97.5 F (36.4 C), temperature source Oral, resp. rate 18, height 5\' 2"  (1.575 m), weight 74.8 kg (165 lb), SpO2 97 %.   Intake/Output Summary (Last 24 hours) at 07/12/16 1315 Last data filed at 07/12/16 0900  Gross per 24 hour  Intake              510 ml  Output                0 ml  Net              510 ml    Exam Awake Alert, Oriented x 3, No new F.N deficits, Normal affect [place].AT,PERRAL Supple Neck,No JVD, No cervical lymphadenopathy appriciated.  Symmetrical Chest wall movement, Good air movement bilaterally, CTAB RRR,No Gallops,Rubs or new Murmurs, No Parasternal Heave +ve B.Sounds, Abd Soft, Non tender, No organomegaly appriciated, No rebound -guarding or rigidity. No Cyanosis, Clubbing or edema, No new Rash or bruise  Data Review   CBC w Diff: Lab Results  Component Value Date   WBC 4.1 07/12/2016   HGB 9.8 (L) 07/12/2016   HCT 28.6 (L) 07/12/2016   PLT 86 (L) 07/12/2016   LYMPHOPCT 36 06/20/2014   MONOPCT 16 (H) 06/20/2014   EOSPCT 5 06/20/2014   BASOPCT 0 06/20/2014    CMP: Lab Results  Component Value Date   NA 139 07/12/2016   K 3.5 07/12/2016   CL 114 (H) 07/12/2016   CO2 20 (L) 07/12/2016   BUN 13 07/12/2016   CREATININE 0.50 07/12/2016   CREATININE 0.78 04/02/2016   PROT 5.4 (L) 07/09/2016   ALBUMIN 2.6 (L) 07/09/2016   BILITOT 0.8 07/09/2016    ALKPHOS 41 07/09/2016   AST 25 07/09/2016   ALT 15 07/09/2016  .   Total Time in preparing paper work, data evaluation and todays exam - 35 minutes  Becky Fields M.D on 07/12/2016 at 1:15 PM  [facility]   Office  [phone]

## 2016-07-12 NOTE — Discharge Instructions (Signed)
Follow with Primary MD Zigmund Gottron, MD in 7 days   Get CBC, CMP,checked  by Primary MD next visit.    Activity: As tolerated with Full fall precautions use walker/cane & assistance as needed   Disposition Home    Diet: Heart Healthy /Carb modified, with feeding assistance and aspiration precautions.   On your next visit with your primary care physician please Get Medicines reviewed and adjusted.   Please request your Prim.MD to go over all Hospital Tests and Procedure/Radiological results at the follow up, please get all Hospital records sent to your Prim MD by signing hospital release before you go home.   If you experience worsening of your admission symptoms, develop shortness of breath, life threatening emergency, suicidal or homicidal thoughts you must seek medical attention immediately by calling 911 or calling your MD immediately  if symptoms less severe.  You Must read complete instructions/literature along with all the possible adverse reactions/side effects for all the Medicines you take and that have been prescribed to you. Take any new Medicines after you have completely understood and accpet all the possible adverse reactions/side effects.   Do not drive, operating heavy machinery, perform activities at heights, swimming or participation in water activities or provide baby sitting services if your were admitted for syncope or siezures until you have seen by Primary MD or a Neurologist and advised to do so again.  Do not drive when taking Pain medications.    Do not take more than prescribed Pain, Sleep and Anxiety Medications  Special Instructions: If you have smoked or chewed Tobacco  in the last 2 yrs please stop smoking, stop any regular Alcohol  and or any Recreational drug use.  Wear Seat belts while driving.   Please note  You were cared for by a hospitalist during your hospital stay. If you have any questions about your discharge medications or the  care you received while you were in the hospital after you are discharged, you can call the unit and asked to speak with the hospitalist on call if the hospitalist that took care of you is not available. Once you are discharged, your primary care physician will handle any further medical issues. Please note that NO REFILLS for any discharge medications will be authorized once you are discharged, as it is imperative that you return to your primary care physician (or establish a relationship with a primary care physician if you do not have one) for your aftercare needs so that they can reassess your need for medications and monitor your lab values.

## 2016-07-12 NOTE — Progress Notes (Signed)
Patient discharge instructions read with the patient. PIV removed. Patient waiting for son to pick her up

## 2016-07-12 NOTE — Care Management Note (Signed)
Case Management Note  Patient Details  Name: Becky Fields MRN: [id number] Date of Birth: 1945/07/30  Subjective/Objective:     Hypokalemia,  Campylobacter diarrhea            Action/Plan: Discharge Planning: AVS reviewed:  Chart reviewed. No NCM needs identified.    Expected Discharge Date:  07/12/16               Expected Discharge Plan:  Home/Self Care  In-House Referral:  NA  Discharge planning Services  CM Consult  Post Acute Care Choice:  NA Choice offered to:  NA  DME Arranged:  N/A DME Agency:  NA  HH Arranged:  NA HH Agency:  NA  Status of Service:  Completed, signed off  If discussed at [place] of Stay Meetings, dates discussed:    Additional Comments:  Erenest Rasher, RN 07/12/2016, 3:08 PM

## 2016-07-12 NOTE — Care Management Important Message (Signed)
Important Message  Patient Details  Name: Becky Fields MRN: [id number] Date of Birth: Feb 16, 1946   Medicare Important Message Given:  Yes    Erenest Rasher, RN 07/12/2016, 3:07 PM

## 2016-07-16 ENCOUNTER — Encounter: Payer: Self-pay | Admitting: Family Medicine

## 2016-07-16 ENCOUNTER — Ambulatory Visit (INDEPENDENT_AMBULATORY_CARE_PROVIDER_SITE_OTHER): Payer: Medicare Other | Admitting: Family Medicine

## 2016-07-16 VITALS — BP 136/70 | HR 76 | Temp 97.8°F | Wt 168.6 lb

## 2016-07-16 DIAGNOSIS — A045 Campylobacter enteritis: Secondary | ICD-10-CM | POA: Diagnosis not present

## 2016-07-16 DIAGNOSIS — E1142 Type 2 diabetes mellitus with diabetic polyneuropathy: Secondary | ICD-10-CM | POA: Diagnosis present

## 2016-07-16 DIAGNOSIS — I10 Essential (primary) hypertension: Secondary | ICD-10-CM

## 2016-07-16 LAB — POCT GLYCOSYLATED HEMOGLOBIN (HGB A1C): Hemoglobin A1C: 6

## 2016-07-16 MED ORDER — HYDROCHLOROTHIAZIDE 12.5 MG PO TABS: 12.5000 mg | ORAL_TABLET | Freq: Every day | ORAL | 3 refills | Status: DC

## 2016-07-16 NOTE — Progress Notes (Signed)
   Subjective:    Patient ID: Becky Fields, female    DOB: 1946-03-01, 71 y.o.   MRN: [id number]  HPI Hospitalized with camphlobacter gastroenteritis.  Rxed with antibiotics,  Now almost back to normal from bowel movement standpoint.  Potassium was also quite low on hospital presentation.  Via replacement it had normalized by DC.  HCTZ was held in the hospital.  She notes swelling of her ankles especially late in the day since DC    Review of Systems     Objective:   Physical Exam  Abd benign 2+ bilateral ankle edema.        Assessment & Plan:

## 2016-07-16 NOTE — Patient Instructions (Signed)
Your A1C was good = 6.0 The only reason you had the potassium problem was due to the diarrhea. OK to stop the potassium I would finish off the probiotics that you have. OK to restart the HCTZ. See me in 3-6 months.  If you are successful at weight loss, I may be able to cut back on your diabetes and high blood pressure medications.

## 2016-07-17 NOTE — Assessment & Plan Note (Signed)
Resolving uneventfully.  Now off all antibiotics.

## 2016-07-17 NOTE — Assessment & Plan Note (Addendum)
OK to restart HCTZ which should help with her edema. Hypokalemia has resolved.  She has not needed potassium supplement with losartin and HCTZ balancing each other out.

## 2016-08-12 ENCOUNTER — Other Ambulatory Visit: Payer: Self-pay | Admitting: Family Medicine

## 2016-08-19 ENCOUNTER — Other Ambulatory Visit: Payer: Self-pay | Admitting: Family Medicine

## 2016-09-04 ENCOUNTER — Other Ambulatory Visit: Payer: Self-pay | Admitting: Family Medicine

## 2016-09-09 ENCOUNTER — Emergency Department (HOSPITAL_COMMUNITY)
Admission: EM | Admit: 2016-09-09 | Discharge: 2016-09-09 | Disposition: A | Discharge status: Home / Self Care | Payer: Medicare Other | Attending: Emergency Medicine | Admitting: Emergency Medicine

## 2016-09-09 ENCOUNTER — Encounter (HOSPITAL_COMMUNITY): Payer: Self-pay | Admitting: Emergency Medicine

## 2016-09-09 DIAGNOSIS — H539 Unspecified visual disturbance: Secondary | ICD-10-CM

## 2016-09-09 DIAGNOSIS — E1140 Type 2 diabetes mellitus with diabetic neuropathy, unspecified: Secondary | ICD-10-CM | POA: Insufficient documentation

## 2016-09-09 DIAGNOSIS — H538 Other visual disturbances: Secondary | ICD-10-CM | POA: Insufficient documentation

## 2016-09-09 DIAGNOSIS — H532 Diplopia: Secondary | ICD-10-CM | POA: Diagnosis present

## 2016-09-09 DIAGNOSIS — I10 Essential (primary) hypertension: Secondary | ICD-10-CM | POA: Diagnosis not present

## 2016-09-09 LAB — CBC WITH DIFFERENTIAL/PLATELET
BASOS ABS: 0 10*3/uL (ref 0.0–0.1)
BASOS PCT: 0 %
EOS PCT: 2 %
Eosinophils Absolute: 0.2 10*3/uL (ref 0.0–0.7)
HCT: 37.4 % (ref 36.0–46.0)
HEMOGLOBIN: 12.5 g/dL (ref 12.0–15.0)
LYMPHS ABS: 1.8 10*3/uL (ref 0.7–4.0)
LYMPHS PCT: 27 %
MCH: 27.7 pg (ref 26.0–34.0)
MCHC: 33.4 g/dL (ref 30.0–36.0)
MCV: 82.7 fL (ref 78.0–100.0)
MONO ABS: 0.6 10*3/uL (ref 0.1–1.0)
Monocytes Relative: 10 %
Neutro Abs: 3.9 10*3/uL (ref 1.7–7.7)
Neutrophils Relative %: 61 %
Platelets: 150 10*3/uL (ref 150–400)
RBC: 4.52 MIL/uL (ref 3.87–5.11)
RDW: 13.6 % (ref 11.5–15.5)
WBC: 6.5 10*3/uL (ref 4.0–10.5)

## 2016-09-09 LAB — BASIC METABOLIC PANEL
Anion gap: 8 (ref 5–15)
BUN: 29 mg/dL — AB (ref 6–20)
CO2: 23 mmol/L (ref 22–32)
CREATININE: 0.85 mg/dL (ref 0.44–1.00)
Calcium: 9.1 mg/dL (ref 8.9–10.3)
Chloride: 105 mmol/L (ref 101–111)
GFR calc Af Amer: >60 mL/min (ref >60)
GFR calc non Af Amer: >60 mL/min (ref >60)
GLUCOSE: 150 mg/dL — AB (ref 65–99)
POTASSIUM: 3.3 mmol/L — AB (ref 3.5–5.1)
SODIUM: 136 mmol/L (ref 135–145)

## 2016-09-09 NOTE — Discharge Instructions (Signed)
You were seen in the ED today with a vision change. This may be due to elevated blood sugar. Return with any changes in vision or numbness, weakness, or sudden severe headache.

## 2016-09-09 NOTE — ED Triage Notes (Signed)
Pt reports while driving earlier she had slight visual changes to where she was seeing double.  Stated she has diabetes and checked her sugar and ate something to make sure it was low and the vision changes persisted.  No other symptoms at this time. Ambulatory in triage.  A&O x4.

## 2016-09-09 NOTE — ED Provider Notes (Signed)
Emergency Department Provider Note   I have reviewed the triage vital signs and the nursing notes.   HISTORY  Chief Complaint No chief complaint on file.   HPI Becky Fields is a 71 y.o. female presents to the ED for evaluation of intermittent double vision. Patient reports driving down the road today when she began noticing double vision. No change with direction or vision. She pulled over to eat a snack thinking that symptoms may be due to blood sugar abnormality but after eating symptoms persisted. Over the last several hours the symptoms would resolve and return. No other symptoms such as numbness, weakness, gait disturbance, or vertigo. No radiation of symptoms. No new medications. Symptoms are mild.    Past Medical History:  Diagnosis Date  . Cataract   . Diabetes mellitus   . Ejection fraction    .  Marland Kitchen Hepatitis C   . Hypertension   . Hypertriglyceridemia   . Takotsubo syndrome    October 16, 2012    Patient Active Problem List   Diagnosis Date Noted  . Campylobacter diarrhea 07/08/2016  . Other long term (current) drug therapy 04/02/2016  . Petechial rash 12/19/2014  . Hepatic cirrhosis ([place]) 05/23/2014  . Mitral regurgitation 10/18/2012  . Takotsubo syndrome 10/18/2012  . HYPERTRIGLYCERIDEMIA 01/25/2009  . INSOMNIA, PERSISTENT 01/25/2009  . Type 2 diabetes mellitus with peripheral neuropathy ([place]) 04/13/2008  . OBESITY 02/21/2008  . History of colonic polyps 05/02/2007  . Chronic hepatitis C without hepatic coma ([place]) 03/07/2007  . HYPERTENSION, BENIGN ESSENTIAL 01/28/2007    Past Surgical History:  Procedure Laterality Date  . CHOLECYSTECTOMY    . COLONOSCOPY    . EYE SURGERY  02/2012   bilateral cataract surg  . LEFT HEART CATHETERIZATION WITH CORONARY ANGIOGRAM N/A 10/17/2012   Procedure: LEFT HEART CATHETERIZATION WITH CORONARY ANGIOGRAM;  Surgeon: Sherren Mocha, MD;  Location: [facility] CATH LAB;  Service: Cardiovascular;  Laterality: N/A;    Current  Outpatient Rx  . Order #: [id number] Class: Historical Med  . Order #: [id number] Class: Normal  . Order #: [id number] Class: No Print  . Order #: [id number] Class: Normal  . Order #: [id number] Class: Normal  . Order #: [id number] Class: Normal  . Order #: [id number] Class: Normal  . Order #: [id number] Class: Normal  . Order #: [id number] Class: Historical Med  . Order #: [id number] Class: Normal  . Order #: [id number] Class: Print  . Order #: [id number] Class: Historical Med    Allergies Lisinopril and Sulfa antibiotics  Family History  Problem Relation Age of Onset  . Aneurysm Father   . Hypertension Mother   . CAD Brother 21  . Hypertension Sister   . Heart attack Neg Hx   . Stroke Neg Hx   . Colon cancer Neg Hx     Social History Social History  Substance Use Topics  . Smoking status: Never Smoker  . Smokeless tobacco: Never Used  . Alcohol use 3.6 oz/week    6 Standard drinks or equivalent per week     Comment: Occasional glass of wine on the weekends    Review of Systems  Constitutional: No fever/chills Eyes: Positive visual changes. ENT: No sore throat. Cardiovascular: Denies chest pain. Respiratory: Denies shortness of breath. Gastrointestinal: No abdominal pain.  No nausea, no vomiting.  No diarrhea.  No constipation. Genitourinary: Negative for dysuria. Musculoskeletal: Negative for back pain. Skin: Negative for rash. Neurological: Negative for headaches, focal weakness or numbness.  10-point ROS otherwise negative.  ____________________________________________   PHYSICAL EXAM:  VITAL SIGNS: ED  Triage Vitals  Enc Vitals Group     BP 09/09/16 1909 (!) 131/93     Pulse Rate 09/09/16 1909 67     Resp 09/09/16 1909 16     Temp 09/09/16 1909 97.7 F (36.5 C)     Temp Source 09/09/16 1909 Oral     SpO2 09/09/16 1909 99 %     Weight 09/09/16 1912 160 lb (72.6 kg)     Height 09/09/16 1912 5\' 2"  (1.575 m)     Pain Score 09/09/16 2105 3   Constitutional: Alert and  oriented. Well appearing and in no acute distress. Eyes: Conjunctivae are normal. PERRL. EOMI. Head: Atraumatic. Nose: No congestion/rhinnorhea. Mouth/Throat: Mucous membranes are moist.  Neck: No stridor.  Cardiovascular: Normal rate, regular rhythm. Good peripheral circulation. Grossly normal heart sounds.   Respiratory: Normal respiratory effort.  No retractions. Lungs CTAB. Gastrointestinal: Soft and nontender. No distention.  Musculoskeletal: No lower extremity tenderness nor edema. No gross deformities of extremities. Neurologic:  Normal speech and language. No gross focal neurologic deficits are appreciated. Normal CN exam 2-12. Normal finger-to-nose testing.  Skin:  Skin is warm, dry and intact. No rash noted. Psychiatric: Mood and affect are normal. Speech and behavior are normal.  ____________________________________________   LABS (all labs ordered are listed, but only abnormal results are displayed)  Labs Reviewed  BASIC METABOLIC PANEL - Abnormal; Notable for the following:       Result Value   Potassium 3.3 (*)    Glucose, Bld 150 (*)    BUN 29 (*)    All other components within normal limits  CBC WITH DIFFERENTIAL/PLATELET  CBG MONITORING, ED   ____________________________________________  RADIOLOGY  None ____________________________________________   PROCEDURES  Procedure(s) performed:   Procedures  None ____________________________________________   INITIAL IMPRESSION / ASSESSMENT AND PLAN / ED COURSE  Pertinent labs & imaging results that were available during my care of the patient were reviewed by me and considered in my medical decision making (see chart for details).  Patient presents to the ED with intermittent double vision that has since resolved. On my exam she has normal EOM and visual acuity. No other focal neurological deficits on exam. Low suspicion that this represents CVA or other central cause for vision change. Labs are  unremarkable. Patient's symptoms have resolved and she is anxious to be discharged. Discussed follow up with PCP in detail.   At this time, I do not feel there is any life-threatening condition present. I have reviewed and discussed all results (EKG, imaging, lab, urine as appropriate), exam findings with patient. I have reviewed nursing notes and appropriate previous records.  I feel the patient is safe to be discharged home without further emergent workup. Discussed usual and customary return precautions. Patient and family (if present) verbalize understanding and are comfortable with this plan.  Patient will follow-up with their primary care provider. If they do not have a primary care provider, information for follow-up has been provided to them. All questions have been answered.  __________________________________________  FINAL CLINICAL IMPRESSION(S) / ED DIAGNOSES  Final diagnoses:  Vision changes     MEDICATIONS GIVEN DURING THIS VISIT:  None  NEW OUTPATIENT MEDICATIONS STARTED DURING THIS VISIT:  None   Note:  This document was prepared using Dragon voice recognition software and may include unintentional dictation errors.  Nanda Quinton, MD Emergency Medicine   Margette Fast, MD 09/10/16 [phone]

## 2016-09-09 NOTE — ED Notes (Signed)
Pt reports understanding of discharge information. No questions at time of discharge. No acute distress at time of discharge. Pt reports improvement in vision at this time.

## 2016-09-10 LAB — CBG MONITORING, ED: GLUCOSE-CAPILLARY: 157 mg/dL — AB (ref 65–99)

## 2016-09-11 DIAGNOSIS — H532 Diplopia: Secondary | ICD-10-CM | POA: Diagnosis not present

## 2016-10-14 ENCOUNTER — Other Ambulatory Visit: Payer: Self-pay | Admitting: Family Medicine

## 2016-10-14 DIAGNOSIS — I10 Essential (primary) hypertension: Secondary | ICD-10-CM

## 2016-10-22 ENCOUNTER — Encounter: Payer: Self-pay | Admitting: *Deleted

## 2016-10-22 ENCOUNTER — Ambulatory Visit (INDEPENDENT_AMBULATORY_CARE_PROVIDER_SITE_OTHER): Payer: Medicare Other | Admitting: *Deleted

## 2016-10-22 VITALS — BP 124/76 | HR 65 | Temp 97.7°F | Ht 62.0 in | Wt 162.4 lb

## 2016-10-22 DIAGNOSIS — Z0000 Encounter for general adult medical examination without abnormal findings: Secondary | ICD-10-CM | POA: Diagnosis not present

## 2016-10-22 DIAGNOSIS — E1142 Type 2 diabetes mellitus with diabetic polyneuropathy: Secondary | ICD-10-CM | POA: Diagnosis present

## 2016-10-22 LAB — POCT GLYCOSYLATED HEMOGLOBIN (HGB A1C): HEMOGLOBIN A1C: 5.8

## 2016-10-22 NOTE — Progress Notes (Signed)
Subjective:   Becky Fields is a 71 y.o. female who presents for Medicare Annual (Subsequent) preventive examination.  Cardiac Risk Factors include: advanced age (>21men, >102 women);diabetes mellitus;hypertension     Objective:     Vitals: BP 124/76 (BP Location: Left Arm, Patient Position: Sitting, Cuff Size: Normal)   Pulse 65   Temp 97.7 F (36.5 C) (Oral)   Ht 5\' 2"  (1.575 m)   Wt 162 lb 6.4 oz (73.7 kg)   SpO2 97%   BMI 29.70 kg/m   Body mass index is 29.7 kg/m.   Tobacco History  Smoking Status  . Never Smoker  Smokeless Tobacco  . Never Used     Counseling given: Yes Patient has never smoked and has no plans to start.   Past Medical History:  Diagnosis Date  . Cataract   . Diabetes mellitus   . Ejection fraction    .  Marland Kitchen Hepatitis C   . Hypertension   . Hypertriglyceridemia   . Takotsubo syndrome    October 16, 2012   Past Surgical History:  Procedure Laterality Date  . CHOLECYSTECTOMY    . COLONOSCOPY    . EYE SURGERY  02/2012   bilateral cataract surg  . LEFT HEART CATHETERIZATION WITH CORONARY ANGIOGRAM N/A 10/17/2012   Procedure: LEFT HEART CATHETERIZATION WITH CORONARY ANGIOGRAM;  Surgeon: Sherren Mocha, MD;  Location: [facility] CATH LAB;  Service: Cardiovascular;  Laterality: N/A;   Family History  Problem Relation Age of Onset  . Aneurysm Father   . Hypertension Mother   . CAD Brother 23  . Hypertension Sister   . Heart attack Neg Hx   . Stroke Neg Hx   . Colon cancer Neg Hx    History  Sexual Activity  . Sexual activity: Not Currently    Outpatient Encounter Prescriptions as of 10/22/2016  Medication Sig  . aspirin 81 MG tablet Take 81 mg by mouth daily.    . fluticasone (FLONASE) 50 MCG/ACT nasal spray Place 2 sprays into both nostrils daily. (Patient taking differently: Place 2 sprays into both nostrils 2 (two) times daily as needed for allergies. )  . glimepiride (AMARYL) 4 MG tablet TAKE TWO TABLETS BY MOUTH ONCE DAILY BEFORE BREAKFAST.    . hydrochlorothiazide (HYDRODIURIL) 12.5 MG tablet Take 1 tablet (12.5 mg total) by mouth daily.  Marland Kitchen JANUVIA 100 MG tablet TAKE ONE TABLET BY MOUTH ONCE DAILY.  Marland Kitchen loratadine (CLARITIN) 10 MG tablet TAKE ONE TABLET BY MOUTH ONCE DAILY AS NEEDED FOR ALLERGY (Patient taking differently: TAKE ONE TABLET BY MOUTH ONCE DAILY)  . losartan (COZAAR) 100 MG tablet TAKE ONE TABLET BY MOUTH ONCE DAILY  . metFORMIN (GLUCOPHAGE) 500 MG tablet Take 1 tablet (500 mg total) by mouth 2 (two) times daily with a meal.  . metoprolol (LOPRESSOR) 50 MG tablet TAKE ONE TABLET BY MOUTH TWICE DAILY  . Multiple Vitamin (MULTIVITAMIN WITH MINERALS) TABS Take 1 tablet by mouth daily. Reported on 12/17/2015  . Polyethyl Glycol-Propyl Glycol (SYSTANE OP) Apply 1 drop to eye 2 (two) times daily as needed (dry eyes). Reported on 09/05/2015  . [DISCONTINUED] hydrochlorothiazide (HYDRODIURIL) 12.5 MG tablet TAKE ONE TABLET BY MOUTH ONCE DAILY  . [DISCONTINUED] phosphorus (K PHOS NEUTRAL) 155-852-130 MG tablet Take 2 tablets (500 mg total) by mouth 3 (three) times daily. Please take for 1 week. (Patient not taking: Reported on 09/09/2016)   No facility-administered encounter medications on file as of 10/22/2016.     Activities of Daily Living In your  present state of health, do you have any difficulty performing the following activities: 10/22/2016 07/12/2016  Hearing? [place]? N -  Difficulty concentrating or making decisions? N -  Walking or climbing stairs? N -  Dressing or bathing? N -  Doing errands, shopping? N N  Preparing Food and eating ? N -  Using the Toilet? N -  In the past six months, have you accidently leaked urine? N -  Do you have problems with loss of bowel control? N -  Managing your Medications? N -  Managing your Finances? N -  Housekeeping or managing your Housekeeping? N -  Some recent data might be hidden   Home Safety:  My home has a working smoke alarm:  Yes X 1           My home throw rugs have  been fastened down to the floor or removed:  Removed I have non-slip mats in the bathtub and shower:  Non-slip surface with built-in shower bench and grab bars         All my home's stairs have railings or bannisters: One level home with 2 outside steps with handrails           My home's floors, stairs and hallways are free from clutter, wires and cords:  Yes     I wear seatbelts consistently:  Yes    Patient Care Team: Zenia Resides, MD as PCP - General Marica Otter, OD (Optometry) Thayer Headings, MD as Consulting Physician (Infectious Diseases)  Dr. Doreene Burke for Dentistry  Lyda Jester, [place] for cardiology Assessment:     Exercise Activities and Dietary recommendations Current Exercise Habits: The patient has a physically strenous job, but has no regular exercise apart from work. (walks in warehouse for work), Exercise limited by: neurologic condition(s) (Peripheral neuropathy 2/2 T2DM)  Goals    . Exercise 3x per week (20 min per time) (pt-stated)          Walking in neighborhood in addition to walking for work    . Weight (lb) < 152 lb (68.9 kg) (pt-stated)          6% weight loss      Fall Risk Fall Risk  10/22/2016 07/16/2016 06/08/2016 04/02/2016 01/09/2016  Falls in the past year? Yes No Yes Yes No  Number falls in past yr: 2 or more - 2 or more 2 or more -  Injury with Fall? Yes - Yes No -  Risk Factor Category  - - High Fall Risk - -  Risk for fall due to : History of fall(s);Impaired balance/gait - Impaired balance/gait - -  Risk for fall due to (comments): Improper footwear - - - -  Follow up Falls evaluation completed;Education provided;Falls prevention discussed - Falls evaluation completed;Education provided;Falls prevention discussed - -   Patient prefers to wear slip on shoes 2/2 diabetic neuropathy in feet. Patient reports this has caused several falls in past year. Discussed importance of wearing proper foot wear to decrease fall risk. Falls prevention  discussed and literature given. PCP notified of fall risk status.  TUG Test:  Done in 9 seconds. Patient used both hands to push out of chair and to sit back down.  Cognitive Function: Mini-Cog  Passed with score 5/5  Depression Screen PHQ 2/9 Scores 10/22/2016 07/16/2016 06/08/2016 04/02/2016  PHQ - 2 Score 0 0 0 0     Cognitive Function MMSE - Mini Mental State Exam 11/18/2011  Orientation to time  5  Orientation to Place 5  Registration 3  Attention/ Calculation 5  Recall 2  Language- name 2 objects 2  Language- repeat 1  Language- follow 3 step command 3  Language- read & follow direction 1  Write a sentence 1  Copy design 1  Total score 29        Immunization History  Administered Date(s) Administered  . Influenza Split 06/12/2011, 05/13/2012  . Influenza Whole 05/02/2007, 04/13/2008, 07/03/2009  . Influenza,inj,Quad PF,36+ Mos 02/24/2013, 02/21/2014, 02/22/2015, 04/02/2016  . Pneumococcal Conjugate-13 12/15/2013  . Pneumococcal Polysaccharide-23 11/06/2011  . Td 10/20/2004  . Tdap 10/31/2014  . Zoster 04/23/2014   Screening Tests Health Maintenance  Topic Date Due  . OPHTHALMOLOGY EXAM  11/13/2016  . HEMOGLOBIN A1C  01/13/2017  . INFLUENZA VACCINE  01/20/2017  . FOOT EXAM  04/02/2017  . MAMMOGRAM  06/18/2018  . TETANUS/TDAP  10/30/2024  . COLONOSCOPY  09/18/2025  . DEXA SCAN  Completed  . Hepatitis C Screening  Completed  . PNA vac Low Risk Adult  Completed      Plan:   Hgb A1c done today = 5.8   Patient has eye appt next week and will ask for results to be faxed to our office.  Discussed starting Calcium 600 mg/ Vit D supplement bid 2/2 Low Bone Mass on Dexa scan in 2013  Discussed wearing proper footwear to decrease fall risk  I have personally reviewed and noted the following in the patient's chart:   . Medical and social history . Use of alcohol, tobacco or illicit drugs  . Current medications and supplements . Functional ability and  status . Nutritional status . Physical activity . Advanced directives . List of other physicians . Hospitalizations, surgeries, and ER visits in previous 12 months . Vitals . Screenings to include cognitive, depression, and falls . Referrals and appointments  In addition, I have reviewed and discussed with patient certain preventive protocols, quality metrics, and best practice recommendations. A written personalized care plan for preventive services as well as general preventive health recommendations were provided to patient.     Velora Heckler, RN  10/22/2016

## 2016-10-22 NOTE — Patient Instructions (Addendum)
[name], Thank you for taking time to come for yourMedicare Wellness Visit. I appreciate your ongoing commitment to your health goals. Please review the following plan we discussed and let me know if I can assist you in the future.   These are the goals we discussed: Weight  < 152 lbs Walking 3 times per week 20 minutes each time       Diabetes and Foot Care Diabetes may cause you to have problems because of poor blood supply (circulation) to your feet and legs. This may cause the skin on your feet to become thinner, break easier, and heal more slowly. Your skin may become dry, and the skin may peel and crack. You may also have nerve damage in your legs and feet causing decreased feeling in them. You may not notice minor injuries to your feet that could lead to infections or more serious problems. Taking care of your feet is one of the most important things you can do for yourself. Follow these instructions at home:  Wear shoes at all times, even in the house. Do not go barefoot. Bare feet are easily injured.  Check your feet daily for blisters, cuts, and redness. If you cannot see the bottom of your feet, use a mirror or ask someone for help.  Wash your feet with warm water (do not use hot water) and mild soap. Then pat your feet and the areas between your toes until they are completely dry. Do not soak your feet as this can dry your skin.  Apply a moisturizing lotion or petroleum jelly (that does not contain alcohol and is unscented) to the skin on your feet and to dry, brittle toenails. Do not apply lotion between your toes.  Trim your toenails straight across. Do not dig under them or around the cuticle. File the edges of your nails with an emery board or nail file.  Do not cut corns or calluses or try to remove them with medicine.  Wear clean socks or stockings every day. Make sure they are not too tight. Do not wear knee-high stockings since they may decrease blood flow to your  legs.  Wear shoes that fit properly and have enough cushioning. To break in new shoes, wear them for just a few hours a day. This prevents you from injuring your feet. Always look in your shoes before you put them on to be sure there are no objects inside.  Do not cross your legs. This may decrease the blood flow to your feet.  If you find a minor scrape, cut, or break in the skin on your feet, keep it and the skin around it clean and dry. These areas may be cleansed with mild soap and water. Do not cleanse the area with peroxide, alcohol, or iodine.  When you remove an adhesive bandage, be sure not to damage the skin around it.  If you have a wound, look at it several times a day to make sure it is healing.  Do not use heating pads or hot water bottles. They may burn your skin. If you have lost feeling in your feet or legs, you may not know it is happening until it is too late.  Make sure your health care provider performs a complete foot exam at least annually or more often if you have foot problems. Report any cuts, sores, or bruises to your health care provider immediately. Contact a health care provider if:  You have an injury that is not  healing.  You have cuts or breaks in the skin.  You have an ingrown nail.  You notice redness on your legs or feet.  You feel burning or tingling in your legs or feet.  You have pain or cramps in your legs and feet.  Your legs or feet are numb.  Your feet always feel cold. Get help right away if:  There is increasing redness, swelling, or pain in or around a wound.  There is a red line that goes up your leg.  Pus is coming from a wound.  You develop a fever or as directed by your health care provider.  You notice a bad smell coming from an ulcer or wound. This information is not intended to replace advice given to you by your health care provider. Make sure you discuss any questions you have with your health care provider. Document  Released: 06/05/2000 Document Revised: 11/14/2015 Document Reviewed: 11/15/2012 Elsevier Interactive Patient Education  2017 [employer].   Fall Prevention in the Home Falls can cause injuries. They can happen to people of all ages. There are many things you can do to make your home safe and to help prevent falls. What can I do on the outside of my home?  Regularly fix the edges of walkways and driveways and fix any cracks.  Remove anything that might make you trip as you walk through a door, such as a raised step or threshold.  Trim any bushes or trees on the path to your home.  Use bright outdoor lighting.  Clear any walking paths of anything that might make someone trip, such as rocks or tools.  Regularly check to see if handrails are loose or broken. Make sure that both sides of any steps have handrails.  Any raised decks and porches should have guardrails on the edges.  Have any leaves, snow, or ice cleared regularly.  Use sand or salt on walking paths during winter.  Clean up any spills in your garage right away. This includes oil or grease spills. What can I do in the bathroom?  Use night lights.  Install grab bars by the toilet and in the tub and shower. Do not use towel bars as grab bars.  Use non-skid mats or decals in the tub or shower.  If you need to sit down in the shower, use a plastic, non-slip stool.  Keep the floor dry. Clean up any water that spills on the floor as soon as it happens.  Remove soap buildup in the tub or shower regularly.  Attach bath mats securely with double-sided non-slip rug tape.  Do not have throw rugs and other things on the floor that can make you trip. What can I do in the bedroom?  Use night lights.  Make sure that you have a light by your bed that is easy to reach.  Do not use any sheets or blankets that are too big for your bed. They should not hang down onto the floor.  Have a firm chair that has side arms. You can  use this for support while you get dressed.  Do not have throw rugs and other things on the floor that can make you trip. What can I do in the kitchen?  Clean up any spills right away.  Avoid walking on wet floors.  Keep items that you use a lot in easy-to-reach places.  If you need to reach something above you, use a strong step stool that has a grab  bar.  Keep electrical cords out of the way.  Do not use floor polish or wax that makes floors slippery. If you must use wax, use non-skid floor wax.  Do not have throw rugs and other things on the floor that can make you trip. What can I do with my stairs?  Do not leave any items on the stairs.  Make sure that there are handrails on both sides of the stairs and use them. Fix handrails that are broken or loose. Make sure that handrails are as long as the stairways.  Check any carpeting to make sure that it is firmly attached to the stairs. Fix any carpet that is loose or worn.  Avoid having throw rugs at the top or bottom of the stairs. If you do have throw rugs, attach them to the floor with carpet tape.  Make sure that you have a light switch at the top of the stairs and the bottom of the stairs. If you do not have them, ask someone to add them for you. What else can I do to help prevent falls?  Wear shoes that:  Do not have high heels.  Have rubber bottoms.  Are comfortable and fit you well.  Are closed at the toe. Do not wear sandals.  If you use a stepladder:  Make sure that it is fully opened. Do not climb a closed stepladder.  Make sure that both sides of the stepladder are locked into place.  Ask someone to hold it for you, if possible.  Clearly mark and make sure that you can see:  Any grab bars or handrails.  First and last steps.  Where the edge of each step is.  Use tools that help you move around (mobility aids) if they are needed. These include:  Canes.  Walkers.  Scooters.  Crutches.  Turn  on the lights when you go into a dark area. Replace any light bulbs as soon as they burn out.  Set up your furniture so you have a clear path. Avoid moving your furniture around.  If any of your floors are uneven, fix them.  If there are any pets around you, be aware of where they are.  Review your medicines with your doctor. Some medicines can make you feel dizzy. This can increase your chance of falling. Ask your doctor what other things that you can do to help prevent falls. This information is not intended to replace advice given to you by your health care provider. Make sure you discuss any questions you have with your health care provider. Document Released: 04/04/2009 Document Revised: 11/14/2015 Document Reviewed: 07/13/2014 Elsevier Interactive Patient Education  2017 [employer]  Health Maintenance, Female Adopting a healthy lifestyle and getting preventive care can go a long way to promote health and wellness. Talk with your health care provider about what schedule of regular examinations is right for you. This is a good chance for you to check in with your provider about disease prevention and staying healthy. In between checkups, there are plenty of things you can do on your own. Experts have done a lot of research about which lifestyle changes and preventive measures are most likely to keep you healthy. Ask your health care provider for more information. Weight and diet Eat a healthy diet  Be sure to include plenty of vegetables, fruits, low-fat dairy products, and lean protein.  Do not eat a lot of foods high in solid fats, added sugars, or salt.  Get  regular exercise. This is one of the most important things you can do for your health.  Most adults should exercise for at least 150 minutes each week. The exercise should increase your heart rate and make you sweat (moderate-intensity exercise).  Most adults should also do strengthening exercises at least twice a week.  This is in addition to the moderate-intensity exercise. Maintain a healthy weight  Body mass index (BMI) is a measurement that can be used to identify possible weight problems. It estimates body fat based on height and weight. Your health care provider can help determine your BMI and help you achieve or maintain a healthy weight.  For females 79 years of age and older:  A BMI below 18.5 is considered underweight.  A BMI of 18.5 to 24.9 is normal.  A BMI of 25 to 29.9 is considered overweight.  A BMI of 30 and above is considered obese. Watch levels of cholesterol and blood lipids  You should start having your blood tested for lipids and cholesterol at 70 years of age, then have this test every 5 years.  You may need to have your cholesterol levels checked more often if:  Your lipid or cholesterol levels are high.  You are older than 70 years of age.  You are at high risk for heart disease. Cancer screening Lung Cancer  Lung cancer screening is recommended for adults 17-36 years old who are at high risk for lung cancer because of a history of smoking.  A yearly low-dose CT scan of the lungs is recommended for people who:  Currently smoke.  Have quit within the past 15 years.  Have at least a 30-pack-year history of smoking. A pack year is smoking an average of one pack of cigarettes a day for 1 year.  Yearly screening should continue until it has been 15 years since you quit.  Yearly screening should stop if you develop a health problem that would prevent you from having lung cancer treatment. Breast Cancer  Practice breast self-awareness. This means understanding how your breasts normally appear and feel.  It also means doing regular breast self-exams. Let your health care provider know about any changes, no matter how small.  If you are in your 20s or 30s, you should have a clinical breast exam (CBE) by a health care provider every 1-3 years as part of a regular health  exam.  If you are 2 or older, have a CBE every year. Also consider having a breast X-ray (mammogram) every year.  If you have a family history of breast cancer, talk to your health care provider about genetic screening.  If you are at high risk for breast cancer, talk to your health care provider about having an MRI and a mammogram every year.  Breast cancer gene (BRCA) assessment is recommended for women who have family members with BRCA-related cancers. BRCA-related cancers include:  Breast.  Ovarian.  Tubal.  Peritoneal cancers.  Results of the assessment will determine the need for genetic counseling and BRCA1 and BRCA2 testing. Cervical Cancer  Your health care provider may recommend that you be screened regularly for cancer of the pelvic organs (ovaries, uterus, and vagina). This screening involves a pelvic examination, including checking for microscopic changes to the surface of your cervix (Pap test). You may be encouraged to have this screening done every 3 years, beginning at age 26.  For women ages 82-65, health care providers may recommend pelvic exams and Pap testing every 3 years, or  they may recommend the Pap and pelvic exam, combined with testing for human papilloma virus (HPV), every 5 years. Some types of HPV increase your risk of cervical cancer. Testing for HPV may also be done on women of any age with unclear Pap test results.  Other health care providers may not recommend any screening for nonpregnant women who are considered low risk for pelvic cancer and who do not have symptoms. Ask your health care provider if a screening pelvic exam is right for you.  If you have had past treatment for cervical cancer or a condition that could lead to cancer, you need Pap tests and screening for cancer for at least 20 years after your treatment. If Pap tests have been discontinued, your risk factors (such as having a new sexual partner) need to be reassessed to determine if  screening should resume. Some women have medical problems that increase the chance of getting cervical cancer. In these cases, your health care provider may recommend more frequent screening and Pap tests. Colorectal Cancer  This type of cancer can be detected and often prevented.  Routine colorectal cancer screening usually begins at 70 years of age and continues through 70 years of age.  Your health care provider may recommend screening at an earlier age if you have risk factors for colon cancer.  Your health care provider may also recommend using home test kits to check for hidden blood in the stool.  A small camera at the end of a tube can be used to examine your colon directly (sigmoidoscopy or colonoscopy). This is done to check for the earliest forms of colorectal cancer.  Routine screening usually begins at age 53.  Direct examination of the colon should be repeated every 5-10 years through 70 years of age. However, you may need to be screened more often if early forms of precancerous polyps or small growths are found. Skin Cancer  Check your skin from head to toe regularly.  Tell your health care provider about any new moles or changes in moles, especially if there is a change in a mole's shape or color.  Also tell your health care provider if you have a mole that is larger than the size of a pencil eraser.  Always use sunscreen. Apply sunscreen liberally and repeatedly throughout the day.  Protect yourself by wearing long sleeves, pants, a wide-brimmed hat, and sunglasses whenever you are outside. Heart disease, diabetes, and high blood pressure  High blood pressure causes heart disease and increases the risk of stroke. High blood pressure is more likely to develop in:  People who have blood pressure in the high end of the normal range (130-139/85-89 mm Hg).  People who are overweight or obese.  People who are African American.  If you are 52-51 years of age, have your  blood pressure checked every 3-5 years. If you are 62 years of age or older, have your blood pressure checked every year. You should have your blood pressure measured twice-once when you are at a hospital or clinic, and once when you are not at a hospital or clinic. Record the average of the two measurements. To check your blood pressure when you are not at a hospital or clinic, you can use:  An automated blood pressure machine at a pharmacy.  A home blood pressure monitor.  If you are between 73 years and 50 years old, ask your health care provider if you should take aspirin to prevent strokes.  Have regular diabetes  screenings. This involves taking a blood sample to check your fasting blood sugar level.  If you are at a normal weight and have a low risk for diabetes, have this test once every three years after 70 years of age.  If you are overweight and have a high risk for diabetes, consider being tested at a younger age or more often. Preventing infection Hepatitis B  If you have a higher risk for hepatitis B, you should be screened for this virus. You are considered at high risk for hepatitis B if:  You were born in a country where hepatitis B is common. Ask your health care provider which countries are considered high risk.  Your parents were born in a high-risk country, and you have not been immunized against hepatitis B (hepatitis B vaccine).  You have HIV or AIDS.  You use needles to inject street drugs.  You live with someone who has hepatitis B.  You have had sex with someone who has hepatitis B.  You get hemodialysis treatment.  You take certain medicines for conditions, including cancer, organ transplantation, and autoimmune conditions. Hepatitis C  Blood testing is recommended for:  Everyone born from [birth year] through 1964.  Anyone with known risk factors for hepatitis C. Sexually transmitted infections (STIs)  You should be screened for sexually transmitted  infections (STIs) including gonorrhea and chlamydia if:  You are sexually active and are younger than 70 years of age.  You are older than 70 years of age and your health care provider tells you that you are at risk for this type of infection.  Your sexual activity has changed since you were last screened and you are at an increased risk for chlamydia or gonorrhea. Ask your health care provider if you are at risk.  If you do not have HIV, but are at risk, it may be recommended that you take a prescription medicine daily to prevent HIV infection. This is called pre-exposure prophylaxis (PrEP). You are considered at risk if:  You are sexually active and do not regularly use condoms or know the HIV status of your partner(s).  You take drugs by injection.  You are sexually active with a partner who has HIV. Talk with your health care provider about whether you are at high risk of being infected with HIV. If you choose to begin PrEP, you should first be tested for HIV. You should then be tested every 3 months for as long as you are taking PrEP. Pregnancy  If you are premenopausal and you may become pregnant, ask your health care provider about preconception counseling.  If you may become pregnant, take 400 to 800 micrograms (mcg) of folic acid every day.  If you want to prevent pregnancy, talk to your health care provider about birth control (contraception). Osteoporosis and menopause  Osteoporosis is a disease in which the bones lose minerals and strength with aging. This can result in serious bone fractures. Your risk for osteoporosis can be identified using a bone density scan.  If you are 83 years of age or older, or if you are at risk for osteoporosis and fractures, ask your health care provider if you should be screened.  Ask your health care provider whether you should take a calcium or vitamin D supplement to lower your risk for osteoporosis.  Menopause may have certain physical  symptoms and risks.  Hormone replacement therapy may reduce some of these symptoms and risks. Talk to your health care provider about whether hormone  replacement therapy is right for you.    Follow these instructions at home:  Schedule regular health, dental, and eye exams.  Stay current with your immunizations.  Do not use any tobacco products including cigarettes, chewing tobacco, or electronic cigarettes.  If you are pregnant, do not drink alcohol.  If you are breastfeeding, limit how much and how often you drink alcohol.  Limit alcohol intake to no more than 1 drink per day for nonpregnant women. One drink equals 12 ounces of beer, 5 ounces of wine, or 1 ounces of hard liquor.  Do not use street drugs.  Do not share needles.  Ask your health care provider for help if you need support or information about quitting drugs.  Tell your health care provider if you often feel depressed.  Tell your health care provider if you have ever been abused or do not feel safe at home. This information is not intended to replace advice given to you by your health care provider. Make sure you discuss any questions you have with your health care provider. Document Released: 12/22/2010 Document Revised: 11/14/2015 Document Reviewed: 03/12/2015 Elsevier Interactive Patient Education  2017 [employer].

## 2016-10-28 DIAGNOSIS — Z961 Presence of intraocular lens: Secondary | ICD-10-CM | POA: Diagnosis not present

## 2016-10-28 DIAGNOSIS — E119 Type 2 diabetes mellitus without complications: Secondary | ICD-10-CM | POA: Diagnosis not present

## 2016-10-28 DIAGNOSIS — H5203 Hypermetropia, bilateral: Secondary | ICD-10-CM | POA: Diagnosis not present

## 2016-10-28 DIAGNOSIS — Z7984 Long term (current) use of oral hypoglycemic drugs: Secondary | ICD-10-CM | POA: Diagnosis not present

## 2016-10-28 DIAGNOSIS — H52221 Regular astigmatism, right eye: Secondary | ICD-10-CM | POA: Diagnosis not present

## 2016-10-28 LAB — HM DIABETES EYE EXAM

## 2016-11-18 ENCOUNTER — Ambulatory Visit (INDEPENDENT_AMBULATORY_CARE_PROVIDER_SITE_OTHER): Payer: Medicare Other | Admitting: Family Medicine

## 2016-11-18 ENCOUNTER — Encounter: Payer: Self-pay | Admitting: Family Medicine

## 2016-11-18 DIAGNOSIS — E1142 Type 2 diabetes mellitus with diabetic polyneuropathy: Secondary | ICD-10-CM | POA: Diagnosis present

## 2016-11-18 DIAGNOSIS — H6122 Impacted cerumen, left ear: Secondary | ICD-10-CM | POA: Insufficient documentation

## 2016-11-18 DIAGNOSIS — M6530 Trigger finger, unspecified finger: Secondary | ICD-10-CM | POA: Diagnosis not present

## 2016-11-18 MED ORDER — GLIMEPIRIDE 4 MG PO TABS: 4.0000 mg | ORAL_TABLET | Freq: Every day | ORAL | 3 refills | Status: DC

## 2016-11-18 NOTE — Assessment & Plan Note (Signed)
Irrigated clear and hearing problem seems to be resolved.

## 2016-11-18 NOTE — Patient Instructions (Signed)
For your diabetes, decrease your glimipiride to one tab (4 mg) once a day.   Please work on the weight loss.  We may be able to stop the glimipiride altogether.   You can decide after a few weeks whether it is time for a hearing test. I will refer you to Sports Medicine for your trigger finger.  They will likely do an injection.  Afterwards, remember the nerf ball exercise

## 2016-11-18 NOTE — Progress Notes (Signed)
   Subjective:    Patient ID: Dawnielle Christiana, female    DOB: Sep 06, 1945, 71 y.o.   MRN: [id number]  HPI Recent Medicare wellness exam so up to date on most issues. Today: 1. Left hand problem. Trigger finger of left ring finger for several months.  Having some pain at MCP joints.  Hand seems to be getting weaker.  No trauma. 2. Decreased hearing of left ear. 3. Had recent diabetic eye exam by Dr. Marica Otter.  Told OK. 4. DM recent A1C is less than 6.  Maybe one hypoglycemic spell.    Review of Systems     Objective:   Physical Exam Cerumen impaction left ear.  Irrigated clear.  Normal TM post irrigation.  Hearing improved and grossly normal post irrigation. Left hand.  Trigger finger of ring finger.  I cannot palpate tendon nodule.  Grip strength weak.          Assessment & Plan:

## 2016-11-18 NOTE — Assessment & Plan Note (Signed)
Refer to [facility] for likely injection.  Will need rehab strengthening.

## 2016-11-18 NOTE — Assessment & Plan Note (Signed)
Decrease amaryl for now.  Likely DC altogether.

## 2016-11-27 ENCOUNTER — Encounter: Payer: Self-pay | Admitting: Family Medicine

## 2016-12-01 ENCOUNTER — Ambulatory Visit (INDEPENDENT_AMBULATORY_CARE_PROVIDER_SITE_OTHER): Payer: Medicare Other | Admitting: Sports Medicine

## 2016-12-01 ENCOUNTER — Encounter: Payer: Self-pay | Admitting: Sports Medicine

## 2016-12-01 VITALS — BP 116/68 | Ht 62.0 in | Wt 162.0 lb

## 2016-12-01 DIAGNOSIS — M6530 Trigger finger, unspecified finger: Secondary | ICD-10-CM | POA: Diagnosis present

## 2016-12-01 MED ORDER — METHYLPREDNISOLONE ACETATE 40 MG/ML IJ SUSP
20.0000 mg | Freq: Once | INTRAMUSCULAR | Status: AC
  Administered 2016-12-01: 20 mg via INTRA_ARTICULAR

## 2016-12-01 NOTE — Progress Notes (Signed)
   Subjective:    Patient ID: Becky Fields, female    DOB: 07-30-1945, 71 y.o.   MRN: [id number]  HPI chief complaint: Left ring finger pain  Very pleasant right-hand-dominant 71 year old female comes in today complaining of 6 months of left hand pain that she localizes to the ring finger. She denies any trauma but rather describes a gradual onset of discomfort that is diffuse throughout the finger. She notes intermittent catching and locking of the finger as well. She has a history of a prior trigger finger in her right hand which required surgical correction but this was several years ago. She denies pain elsewhere in her hand but she does endorse generalized weakness in the hand but is unsure whether or not that is due to pain and guarding of her ring finger. No numbness or tingling.  Past medical history reviewed Medications reviewed Allergies reviewed    Review of Systems    as above Objective:   Physical Exam  Well-developed, well-nourished. No acute distress. Awake alert oriented 3. Vital signs reviewed  Left hand: Examination of the left hand with attention to the left ring finger shows no active triggering but she does have decreased passive and active flexion likely secondary to flexor tenosynovitis. Small palpable nodule at the A1 pulley which is minimally tender. She does have weakness with grip but sensation is intact to light touch grossly. Good pulses.      Assessment & Plan:   Left hand pain secondary to fourth digit trigger finger Left hand weakness  Trigger finger was injected today with cortisone. Injection was directed into the flexor tendon sheath just distal to the A1 pulley. This was done after risks and benefits were explained. Patient tolerated the procedure without difficulty. She will follow-up with me in 4 weeks for reevaluation. If symptoms persist despite today's injection then I would consider referral for possible A1 pulley release. If her trigger finger  symptoms resolve but her hand weakness persists then we will need to embark upon a workup to rule out neurological causes of hand pain and weakness such as carpal tunnel syndrome or possibly cervical radiculopathy. Patient will call me with questions or concerns prior to her follow-up visit.  Consent obtained and verified. Time-out conducted. Noted no overlying erythema, induration, or other signs of local infection. Skin prepped in a sterile fashion. Topical analgesic spray: Ethyl chloride. Joint: left hand 4th digit trigger finger Needle: 25g 5/8 inch Completed without difficulty. Meds: 0.5cc 1% xylocaine, 0.5cc(20mg ) depomedrol  Advised to call if fevers/chills, erythema, induration, drainage, or persistent bleeding.

## 2016-12-21 ENCOUNTER — Telehealth: Payer: Self-pay | Admitting: *Deleted

## 2016-12-21 ENCOUNTER — Ambulatory Visit
Admission: RE | Admit: 2016-12-21 | Discharge: 2016-12-21 | Disposition: A | Discharge status: Home / Self Care | Payer: Medicare Other | Source: Ambulatory Visit | Attending: Internal Medicine | Admitting: Internal Medicine

## 2016-12-21 DIAGNOSIS — B1710 Acute hepatitis C without hepatic coma: Secondary | ICD-10-CM

## 2016-12-21 DIAGNOSIS — B182 Chronic viral hepatitis C: Secondary | ICD-10-CM | POA: Diagnosis not present

## 2016-12-21 NOTE — Telephone Encounter (Signed)
Called patient and left voice mail to call the clinic for her ultrasound results. Becky Fields

## 2016-12-21 NOTE — Telephone Encounter (Signed)
-----   Message from Thayer Headings, MD sent at 12/21/2016  9:18 AM EDT ----- Please let her know her ultrasound is fine.  She will need to continue checking every 6 months with limited abdominal ultrasound for [facility] screening.   Will see if Dr.Hensel (copied) wants to do this every 6 months since he sees her regularly or we can continue to do it.   Thanks

## 2016-12-22 NOTE — Telephone Encounter (Signed)
Patient given ultrasound results   Jaciel Diem Mahlon Gammon, RN

## 2016-12-28 ENCOUNTER — Ambulatory Visit (INDEPENDENT_AMBULATORY_CARE_PROVIDER_SITE_OTHER): Payer: Medicare Other | Admitting: Sports Medicine

## 2016-12-28 VITALS — BP 130/70

## 2016-12-28 DIAGNOSIS — M6530 Trigger finger, unspecified finger: Secondary | ICD-10-CM | POA: Diagnosis present

## 2016-12-28 NOTE — Progress Notes (Signed)
   Subjective:    Patient ID: Becky Fields, female    DOB: [birth year], 71 y.o.   MRN: [id number]  HPI patient comes in today for follow-up on a left ring finger trigger finger. Cortisone injection administered on June 12 has helped. She no longer has pain. No locking. The weakness in her hand has resolved as well. She is pleased with her result.     Review of Systems     as above  Objective:   Physical Exam  Well-developed, well-nourished. No acute distress   left hand: There is no active triggering of the left ring finger. No palpable nodule. No soft tissue swelling. No tenderness to palpation. Good grip strength. Neurovascular intact distally.      Assessment & Plan    Resolved trigger finger, left ring finger  No further treatment needed. Her weakness has resolved as well. Follow-up as needed.

## 2017-01-16 ENCOUNTER — Other Ambulatory Visit: Payer: Self-pay | Admitting: Family Medicine

## 2017-01-16 DIAGNOSIS — E1142 Type 2 diabetes mellitus with diabetic polyneuropathy: Secondary | ICD-10-CM

## 2017-02-05 ENCOUNTER — Other Ambulatory Visit: Payer: Self-pay | Admitting: Family Medicine

## 2017-03-04 ENCOUNTER — Ambulatory Visit (INDEPENDENT_AMBULATORY_CARE_PROVIDER_SITE_OTHER): Payer: Medicare Other | Admitting: Family Medicine

## 2017-03-04 ENCOUNTER — Encounter: Payer: Self-pay | Admitting: Family Medicine

## 2017-03-04 VITALS — BP 122/68 | HR 88 | Temp 97.8°F | Ht 62.0 in | Wt 167.0 lb

## 2017-03-04 DIAGNOSIS — R350 Frequency of micturition: Secondary | ICD-10-CM | POA: Diagnosis not present

## 2017-03-04 DIAGNOSIS — Z23 Encounter for immunization: Secondary | ICD-10-CM | POA: Diagnosis present

## 2017-03-04 DIAGNOSIS — E1142 Type 2 diabetes mellitus with diabetic polyneuropathy: Secondary | ICD-10-CM | POA: Diagnosis not present

## 2017-03-04 DIAGNOSIS — N3000 Acute cystitis without hematuria: Secondary | ICD-10-CM

## 2017-03-04 LAB — POCT URINALYSIS DIP (MANUAL ENTRY)
BILIRUBIN UA: NEGATIVE
BILIRUBIN UA: NEGATIVE mg/dL
GLUCOSE UA: NEGATIVE mg/dL
Nitrite, UA: POSITIVE — AB
Protein Ur, POC: NEGATIVE mg/dL
SPEC GRAV UA: 1.02 (ref 1.010–1.025)
UROBILINOGEN UA: 0.2 U/dL
pH, UA: 5.5 (ref 5.0–8.0)

## 2017-03-04 LAB — POCT GLYCOSYLATED HEMOGLOBIN (HGB A1C): Hemoglobin A1C: 6

## 2017-03-04 MED ORDER — CEPHALEXIN 500 MG PO CAPS: 500.0000 mg | ORAL_CAPSULE | Freq: Two times a day (BID) | ORAL | 0 refills | Status: AC

## 2017-03-04 NOTE — Assessment & Plan Note (Signed)
Keflex

## 2017-03-04 NOTE — Assessment & Plan Note (Signed)
Well controled.  Callous trimmed.

## 2017-03-04 NOTE — Patient Instructions (Signed)
You do have a urine infection.  I sent in a prescription for antibiotics. Your A1c was great. Good luck with your mom. You are good to go for three months. Do keep a close eye on that foot callous.  I am happy to trim up whenever.

## 2017-03-04 NOTE — Progress Notes (Signed)
   Subjective:    Patient ID: Becky Fields, female    DOB: [birth year], 71 y.o.   MRN: [id number]  HPI C/O urgency, frequency and dysuria.  No fever, flank pain or systemic symptoms.  Other issues. DM.  Due for A1C.  Great at goal Diabetic foot exam.  Due.  Also has hx of poorly healing toe ulcer.      Review of Systems     Objective:   Physical Exam No CVA tenderness Mild suprapubic tenderness.   Diabetic foot exam done.  Large callous with some central necrosis plantar surface of right great toe.  Callous trimmed.  Fortunately, no central ulcer.  UA classic for infection.        Assessment & Plan:

## 2017-07-15 ENCOUNTER — Other Ambulatory Visit: Payer: Self-pay | Admitting: Family Medicine

## 2017-07-15 DIAGNOSIS — E1142 Type 2 diabetes mellitus with diabetic polyneuropathy: Secondary | ICD-10-CM

## 2017-07-23 ENCOUNTER — Other Ambulatory Visit: Payer: Self-pay | Admitting: Family Medicine

## 2017-07-23 DIAGNOSIS — E1142 Type 2 diabetes mellitus with diabetic polyneuropathy: Secondary | ICD-10-CM

## 2017-08-12 ENCOUNTER — Other Ambulatory Visit: Payer: Self-pay | Admitting: Family Medicine

## 2017-08-12 ENCOUNTER — Other Ambulatory Visit: Payer: Self-pay

## 2017-08-12 ENCOUNTER — Encounter: Payer: Self-pay | Admitting: Family Medicine

## 2017-08-12 ENCOUNTER — Ambulatory Visit (INDEPENDENT_AMBULATORY_CARE_PROVIDER_SITE_OTHER): Payer: Medicare HMO | Admitting: Family Medicine

## 2017-08-12 VITALS — BP 118/70 | HR 84 | Temp 98.7°F | Ht 62.0 in | Wt 166.0 lb

## 2017-08-12 DIAGNOSIS — I10 Essential (primary) hypertension: Secondary | ICD-10-CM

## 2017-08-12 DIAGNOSIS — E781 Pure hyperglyceridemia: Secondary | ICD-10-CM | POA: Diagnosis not present

## 2017-08-12 DIAGNOSIS — K7460 Unspecified cirrhosis of liver: Secondary | ICD-10-CM

## 2017-08-12 DIAGNOSIS — E1142 Type 2 diabetes mellitus with diabetic polyneuropathy: Secondary | ICD-10-CM | POA: Diagnosis not present

## 2017-08-12 LAB — POCT GLYCOSYLATED HEMOGLOBIN (HGB A1C): Hemoglobin A1C: 5.8

## 2017-08-12 NOTE — Assessment & Plan Note (Signed)
Will get fasting labs.  Not fasting today

## 2017-08-12 NOTE — Progress Notes (Signed)
   Subjective:    Patient ID: Becky Fields, female    DOB: February 28, 1946, 72 y.o.   MRN: [id number]  HPI FU multi chronic issues - all doing well 1. HBP good control.  Previous low K.  Needs recheck 2. Due for lipids.  Not fasting today.  Needs to be because of previous high triglycerides.   3. DM.  Due for A1C.  Home BS good.  Wt stable. 4. Previous neuropathy and foot ulcer calous Needs recheck 5. Hep C and cirrhosis.  Due for RUQ ultrasound.    Review of Systems     Objective:   Physical Exam VS noted including wt Lungs clear Cardiac RRR without m or g Rt foot - callous dorsum right great toe trimmed.       Assessment & Plan:

## 2017-08-12 NOTE — Assessment & Plan Note (Addendum)
Trimmed callous, which is clean, non ulcerated and non infected. Great DM control

## 2017-08-12 NOTE — Assessment & Plan Note (Signed)
Great control.  Check BMP

## 2017-08-12 NOTE — Patient Instructions (Addendum)
Come back for fasting blood work for your cholesterol panel at your convenience.  Due for liver ultrasound.  Nurse will schedule. A1C is great control See me in 6 months if things goings.

## 2017-08-12 NOTE — Assessment & Plan Note (Signed)
Screening RUQ ultrasound

## 2017-08-17 ENCOUNTER — Other Ambulatory Visit: Payer: Medicare HMO

## 2017-08-17 ENCOUNTER — Ambulatory Visit (HOSPITAL_COMMUNITY)
Admission: RE | Admit: 2017-08-17 | Discharge: 2017-08-17 | Disposition: A | Discharge status: Home / Self Care | Payer: Medicare HMO | Source: Ambulatory Visit | Attending: Family Medicine | Admitting: Family Medicine

## 2017-08-17 DIAGNOSIS — I10 Essential (primary) hypertension: Secondary | ICD-10-CM | POA: Diagnosis not present

## 2017-08-17 DIAGNOSIS — K7460 Unspecified cirrhosis of liver: Secondary | ICD-10-CM | POA: Diagnosis not present

## 2017-08-17 DIAGNOSIS — Z9049 Acquired absence of other specified parts of digestive tract: Secondary | ICD-10-CM | POA: Diagnosis not present

## 2017-08-17 DIAGNOSIS — Z8719 Personal history of other diseases of the digestive system: Secondary | ICD-10-CM | POA: Diagnosis not present

## 2017-08-17 DIAGNOSIS — E781 Pure hyperglyceridemia: Secondary | ICD-10-CM | POA: Diagnosis not present

## 2017-08-18 LAB — LIPID PANEL
Chol/HDL Ratio: 4.9 ratio — ABNORMAL HIGH (ref 0.0–4.4)
Cholesterol, Total: 181 mg/dL (ref 100–199)
HDL: 37 mg/dL — ABNORMAL LOW (ref >39)
LDL Calculated: 81 mg/dL (ref 0–99)
TRIGLYCERIDES: 315 mg/dL — AB (ref 0–149)
VLDL Cholesterol Cal: 63 mg/dL — ABNORMAL HIGH (ref 5–40)

## 2017-08-18 LAB — BASIC METABOLIC PANEL
BUN/Creatinine Ratio: 29 — ABNORMAL HIGH (ref 12–28)
BUN: 23 mg/dL (ref 8–27)
CHLORIDE: 103 mmol/L (ref 96–106)
CO2: 22 mmol/L (ref 20–29)
Calcium: 9.2 mg/dL (ref 8.7–10.3)
Creatinine, Ser: 0.8 mg/dL (ref 0.57–1.00)
GFR calc Af Amer: 86 mL/min/{1.73_m2} (ref >59)
GFR, EST NON AFRICAN AMERICAN: 75 mL/min/{1.73_m2} (ref >59)
Glucose: 124 mg/dL — ABNORMAL HIGH (ref 65–99)
POTASSIUM: 4 mmol/L (ref 3.5–5.2)
SODIUM: 143 mmol/L (ref 134–144)

## 2017-09-10 ENCOUNTER — Other Ambulatory Visit: Payer: Self-pay | Admitting: Family Medicine

## 2017-10-15 ENCOUNTER — Other Ambulatory Visit: Payer: Self-pay | Admitting: Family Medicine

## 2017-10-15 DIAGNOSIS — I10 Essential (primary) hypertension: Secondary | ICD-10-CM

## 2017-10-15 DIAGNOSIS — E1142 Type 2 diabetes mellitus with diabetic polyneuropathy: Secondary | ICD-10-CM

## 2017-10-29 DIAGNOSIS — Z7984 Long term (current) use of oral hypoglycemic drugs: Secondary | ICD-10-CM | POA: Diagnosis not present

## 2017-10-29 DIAGNOSIS — H40053 Ocular hypertension, bilateral: Secondary | ICD-10-CM | POA: Diagnosis not present

## 2017-10-29 DIAGNOSIS — E119 Type 2 diabetes mellitus without complications: Secondary | ICD-10-CM | POA: Diagnosis not present

## 2017-10-29 DIAGNOSIS — H5203 Hypermetropia, bilateral: Secondary | ICD-10-CM | POA: Diagnosis not present

## 2017-10-29 DIAGNOSIS — H52222 Regular astigmatism, left eye: Secondary | ICD-10-CM | POA: Diagnosis not present

## 2017-10-29 DIAGNOSIS — H40013 Open angle with borderline findings, low risk, bilateral: Secondary | ICD-10-CM | POA: Diagnosis not present

## 2017-10-29 LAB — HM DIABETES EYE EXAM

## 2018-01-24 ENCOUNTER — Other Ambulatory Visit: Payer: Self-pay | Admitting: Family Medicine

## 2018-02-23 ENCOUNTER — Telehealth: Payer: Self-pay | Admitting: Family Medicine

## 2018-02-23 NOTE — Telephone Encounter (Signed)
LVM for patient to return our call. When she call's back please schedule Medicare Wellness Visit. Blima Rich

## 2018-03-10 ENCOUNTER — Encounter: Payer: Self-pay | Admitting: Family Medicine

## 2018-03-10 ENCOUNTER — Ambulatory Visit (INDEPENDENT_AMBULATORY_CARE_PROVIDER_SITE_OTHER): Payer: Medicare HMO | Admitting: Family Medicine

## 2018-03-10 ENCOUNTER — Other Ambulatory Visit: Payer: Self-pay

## 2018-03-10 VITALS — BP 110/60 | HR 64 | Temp 98.5°F | Ht 62.0 in | Wt 165.0 lb

## 2018-03-10 DIAGNOSIS — Z23 Encounter for immunization: Secondary | ICD-10-CM | POA: Diagnosis not present

## 2018-03-10 DIAGNOSIS — E1142 Type 2 diabetes mellitus with diabetic polyneuropathy: Secondary | ICD-10-CM

## 2018-03-10 LAB — POCT GLYCOSYLATED HEMOGLOBIN (HGB A1C): HbA1c, POC (controlled diabetic range): 5.7 % (ref 0.0–7.0)

## 2018-03-10 NOTE — Assessment & Plan Note (Addendum)
Patient's diabetes is well controlled. Her A1C 7 months ago was 5.8 and today it is 5.7.  Patient is adherent to her medication regimen: Metformin 500mg  bid, Glimepiride 4mg  qd, and Januiva 100mg  qd Patient is concerned about her weight. Feels like she won't be able to lose weight if she continues with her current eating habits. Expresses interest in intermittent fasting. Will discontinue Glimepiride 4mg  qd given patient's consistent goal A1C, which reduce risk of hypoglycemia with any fasting/dieting that she chooses.  Discussed dedicated aerobic exercise with patient. She has agreed to start walking in the mornings now that it is getting cooler. Will follow up on her progress at next visit. Patient has a history of ulceration to her right great toe; no ulcerations today. Patient continues to check her feet regularly and trims her foot calluses to prevent complications. Abnormal foot exam; patient with known history of foot neuropathy.  Patient due for ophthalmology exam. However, patient was seen by her eye doctor 10/2017. Plans to have information released.

## 2018-03-10 NOTE — Patient Instructions (Addendum)
It was great to meet you today Ms Kimbell!  1 It is okay to stop the Glimperide (Amaryl) 4mg  tablet which will make intermittent fasting safer, if that's something you want to do.  2 Try walking in the morning, now that it is getting cooler outside! We can discuss your progress at your follow up appointment. Good luck.  Mercy Moore, Medical Student

## 2018-03-10 NOTE — Progress Notes (Signed)
Subjective:   Patient ID: Becky Fields    DOB: 1946-01-01, 72 y.o. female   MRN: [id number]  Chief Complaint: 6 mo follow up  History of Present Illness: Becky Fields is a 72 y.o. woman with history of diabetes, hypertension, and hepatitis C who presents to clinic today for her 6 month follow up for diabetes. Patient is taking Metformin 500mg  bid, Glimepiride 4mg  qd, and Januiva 100mg  qd, as prescribed. Patient reports diarrhea with her Metformin which she manages with Imodium daily. Patient endorses intermittent dysuria and vaginal itching with Januvia, but reports resolution within a few days with OTC medications. Patient states that if her genitourinary symptoms get bad she just calls the office. Patient reports very rare episodes of hypoglycemia involving weakness and lightheadedness: ~2 since her last visit 6 months ago. Patient drinks juice or eats something sweet when her blood glucose is low and returns to baseline within about 20 minutes. Patient is concerned about her weight. States she is very active at work, but does not engage in any dedicated aerobic exercise. Patient would like to lose weight, but hasn't been able to with her current eating habiting. Patient interested in intermittent fasting, but unsure if it was safe even her diabetes.    Patient amenable to receiving flu shot today.   Review of Systems  Constitutional: Negative for fever.  Respiratory: Negative for shortness of breath.   Cardiovascular: Negative for chest pain and palpitations.  Genitourinary: Negative for dysuria, frequency, hematuria, urgency and vaginal discharge.  Neurological: Positive for numbness. Negative for weakness and light-headedness.    Current Outpatient Medications  Medication Sig Dispense Refill  . aspirin 81 MG tablet Take 81 mg by mouth daily.      . fluticasone (FLONASE) 50 MCG/ACT nasal spray Place 2 sprays into both nostrils daily. (Patient taking differently: Place 2 sprays into both  nostrils 2 (two) times daily as needed for allergies. ) 16 g 12  . glimepiride (AMARYL) 4 MG tablet Take 1 tablet (4 mg total) by mouth daily with breakfast. 90 tablet 3  . hydrochlorothiazide (HYDRODIURIL) 12.5 MG tablet TAKE 1 TABLET BY MOUTH ONCE DAILY 90 tablet 3  . JANUVIA 100 MG tablet TAKE 1 TABLET BY MOUTH ONCE DAILY 90 tablet 3  . loratadine (CLARITIN) 10 MG tablet TAKE 1 TABLET BY MOUTH ONCE DAILY AS NEEDED FOR ALLERGY 90 tablet 3  . losartan (COZAAR) 100 MG tablet TAKE 1 TABLET BY MOUTH ONCE DAILY 90 tablet 3  . metFORMIN (GLUCOPHAGE) 500 MG tablet TAKE 1 TABLET BY MOUTH TWICE DAILY WITH A MEAL 180 tablet 3  . metoprolol tartrate (LOPRESSOR) 50 MG tablet TAKE 1 TABLET BY MOUTH TWICE DAILY 180 tablet 3  . Multiple Vitamin (MULTIVITAMIN WITH MINERALS) TABS Take 1 tablet by mouth daily. Reported on 12/17/2015    . Polyethyl Glycol-Propyl Glycol (SYSTANE OP) Apply 1 drop to eye 2 (two) times daily as needed (dry eyes). Reported on 09/05/2015     No current facility-administered medications for this visit.     Objective:  BP 110/60   Pulse 64   Temp 98.5 F (36.9 C) (Oral)   Ht 5\' 2"  (1.575 m)   Wt 165 lb (74.8 kg)   SpO2 99%   BMI 30.18 kg/m   Physical Exam  Constitutional: She appears well-nourished.  Cardiovascular: Normal rate and regular rhythm.  Murmur heard. Pulmonary/Chest: Effort normal and breath sounds normal.  Neurological:  thick calluses to lateral and plantar surfaces of great toes bilaterally  severely decreased sensation at plantar surfaces  Skin: Capillary refill takes less than 2 seconds.  Nursing note and vitals reviewed.   Assessment & Plan:  Becky Fields is a 72 y.o. woman with history of diabetes, hypertension, and hepatitis C who presents to clinic today for a 6 month diabetes follow up.   Type 2 diabetes mellitus with peripheral neuropathy (HCC) Patient's diabetes is well controlled. Her A1C 7 months ago was 5.8 and today it is 5.7.  Patient is  adherent to her medication regimen: Metformin 500mg  bid, Glimepiride 4mg  qd, and Januiva 100mg  qd Patient is concerned about her weight. Feels like she won't be able to lose weight if she continues with her current eating habits. Expresses interest in intermittent fasting. Will discontinue Glimepiride 4mg  qd given patient's consistent goal A1C, which reduce risk of hypoglycemia with any fasting/dieting that she chooses.  Discussed dedicated aerobic exercise with patient. She has agreed to start walking in the mornings now that it is getting cooler. Will follow up on her progress at next visit. Patient has a history of ulceration to her right great toe; no ulcerations today. Patient continues to check her feet regularly and trims her foot calluses to prevent complications. Abnormal foot exam; patient with known history of foot neuropathy.  Patient due for ophthalmology exam. However, patient was seen by her eye doctor 10/2017. Plans to have information released.   Orders Placed This Encounter  Procedures  . Flu Vaccine QUAD 36+ mos IM  . POCT glycosylated hemoglobin (Hb A1C)   No orders of the defined types were placed in this encounter.   Mercy Moore, [facility] 03/10/2018 10:07 AM  I was physically present and/or repeated all elements of the H&PE by [place].  I agree with her documentation and management.  FU of diabetes which is extremely well controled.  Patient wants to try intermitant fasting for wt loss.  We discussed and will DC her sulfonylurea to make it safer.  She got a flu shot today and we will request a copy of her recent eye exam

## 2018-03-11 ENCOUNTER — Encounter: Payer: Self-pay | Admitting: Family Medicine

## 2018-04-21 ENCOUNTER — Ambulatory Visit: Payer: Medicare HMO | Admitting: Family Medicine

## 2018-05-26 ENCOUNTER — Other Ambulatory Visit: Payer: Self-pay

## 2018-05-26 ENCOUNTER — Encounter: Payer: Self-pay | Admitting: Family Medicine

## 2018-05-26 ENCOUNTER — Ambulatory Visit (INDEPENDENT_AMBULATORY_CARE_PROVIDER_SITE_OTHER): Payer: Medicare HMO | Admitting: Family Medicine

## 2018-05-26 VITALS — BP 102/60 | HR 74 | Temp 97.9°F | Ht 62.0 in | Wt 163.6 lb

## 2018-05-26 DIAGNOSIS — N3000 Acute cystitis without hematuria: Secondary | ICD-10-CM | POA: Diagnosis not present

## 2018-05-26 DIAGNOSIS — E781 Pure hyperglyceridemia: Secondary | ICD-10-CM

## 2018-05-26 DIAGNOSIS — R300 Dysuria: Secondary | ICD-10-CM | POA: Diagnosis not present

## 2018-05-26 DIAGNOSIS — E1142 Type 2 diabetes mellitus with diabetic polyneuropathy: Secondary | ICD-10-CM

## 2018-05-26 LAB — POCT URINALYSIS DIP (MANUAL ENTRY)
GLUCOSE UA: NEGATIVE mg/dL
NITRITE UA: NEGATIVE
Spec Grav, UA: 1.025 (ref 1.010–1.025)
UROBILINOGEN UA: 0.2 U/dL
pH, UA: 5.5 (ref 5.0–8.0)

## 2018-05-26 LAB — POCT GLYCOSYLATED HEMOGLOBIN (HGB A1C): HbA1c, POC (controlled diabetic range): 6.2 % (ref 0.0–7.0)

## 2018-05-26 MED ORDER — CEPHALEXIN 500 MG PO CAPS: 500.0000 mg | ORAL_CAPSULE | Freq: Two times a day (BID) | ORAL | 0 refills | Status: DC

## 2018-05-26 MED ORDER — PRAVASTATIN SODIUM 40 MG PO TABS: 40.0000 mg | ORAL_TABLET | Freq: Every day | ORAL | 3 refills | Status: DC

## 2018-05-26 NOTE — Assessment & Plan Note (Signed)
Keflex for symptomatic UTI.

## 2018-05-26 NOTE — Patient Instructions (Signed)
Things seem great.   Three issues. I sent in an antibiotic for your urinary infection. Stop the amaryl - it is likely no longer helping you. I sent in a prescription for a statin because you are diabetic. Please sign a release so that we can get your eye exam results. See me in the spring after the flu season.  Next visit will include blood work  Please use over the counter lamisil/terbinafine cream for your rash.

## 2018-05-26 NOTE — Assessment & Plan Note (Signed)
DC amaryl.  Doubt effective at 10 y DM duration

## 2018-05-26 NOTE — Assessment & Plan Note (Signed)
Restart statin due to DM and ASCVD risk

## 2018-05-26 NOTE — Progress Notes (Signed)
Established Patient Office Visit  Subjective:  Patient ID: Becky Fields, female    DOB: [birth year]  Age: 72 y.o. MRN: [id number]  CC:  Chief Complaint  Patient presents with  . Kidney check up    HPI Becky Fields presents for multiple issues: 1. Called and told she needs a kidney check.  No record of my office calling.  Likely insurance company for DM screening.  On an ARB and does not need screen. 2. DM good control.  On amaryl despite DM x 10 years.  No hypoglycemic sx. 3. DM with ASCVD risk.  Not on statin.  Was at one point but it seems to have dropped off.  Willing to take statin.   4. Urgency and frequency.  Mild dysuria.  No fever.   5. Due for diabetic eye exam  Past Medical History:  Diagnosis Date  . Cataract   . Diabetes mellitus   . Ejection fraction    .  Marland Kitchen Hepatitis C   . Hypertension   . Hypertriglyceridemia   . Takotsubo syndrome    October 16, 2012    Past Surgical History:  Procedure Laterality Date  . CHOLECYSTECTOMY    . COLONOSCOPY    . EYE SURGERY  02/2012   bilateral cataract surg  . LEFT HEART CATHETERIZATION WITH CORONARY ANGIOGRAM N/A 10/17/2012   Procedure: LEFT HEART CATHETERIZATION WITH CORONARY ANGIOGRAM;  Surgeon: Sherren Mocha, MD;  Location: [facility] CATH LAB;  Service: Cardiovascular;  Laterality: N/A;    Family History  Problem Relation Age of Onset  . Aneurysm Father   . Hypertension Mother   . CAD Brother 58  . Hypertension Sister   . Heart attack Neg Hx   . Stroke Neg Hx   . Colon cancer Neg Hx     Social History   Socioeconomic History  . Marital status: Widowed    Spouse name: Ron  . Number of children: 2  . Years of education: Bachelors  . Highest education level: Not on file  Occupational History  . Occupation: Control and instrumentation engineer: [employer]  Social Needs  . Financial resource strain: Not on file  . Food insecurity:    Worry: Not on file    Inability: Not on file  . Transportation needs:    Medical: Not on file      Non-medical: Not on file  Tobacco Use  . Smoking status: Never Smoker  . Smokeless tobacco: Never Used  Substance and Sexual Activity  . Alcohol use: Yes    Alcohol/week: 6.0 standard drinks    Types: 6 Standard drinks or equivalent per week    Comment: Occasional glass of wine on the weekends  . Drug use: No  . Sexual activity: Not Currently  Lifestyle  . Physical activity:    Days per week: Not on file    Minutes per session: Not on file  . Stress: Not on file  Relationships  . Social connections:    Talks on phone: Not on file    Gets together: Not on file    Attends religious service: Not on file    Active member of club or organization: Not on file    Attends meetings of clubs or organizations: Not on file    Relationship status: Not on file  . Intimate partner violence:    Fear of current or ex partner: Not on file    Emotionally abused: Not on file    Physically abused: Not on file  Forced sexual activity: Not on file  Other Topics Concern  . Not on file  Social History Narrative   Health Care POA:    Emergency Contact: husband, Royanne Warshaw (c) [phone]   End of Life Plan:    Who lives with you: husband, Ron, occasionally son who works out of town   Any pets: 2 dogs, 2 cats   Diet: Pt has a varied diet of protein, starch, vegetables.  Pt is not currently on any kind of diet modifications for DM control.   Exercise: Pt has not regular exercise routine.   Seatbelts: Pt reports wearing seatbelt when in vehicles.    Sun Exposure/Protection: Pt reports not wearing sun protection.   Hobbies: Spending time with granddaughter, reading          Outpatient Medications Prior to Visit  Medication Sig Dispense Refill  . aspirin 81 MG tablet Take 81 mg by mouth daily.      . fluticasone (FLONASE) 50 MCG/ACT nasal spray Place 2 sprays into both nostrils daily. (Patient taking differently: Place 2 sprays into both nostrils 2 (two) times daily as needed for allergies. ) 16  g 12  . hydrochlorothiazide (HYDRODIURIL) 12.5 MG tablet TAKE 1 TABLET BY MOUTH ONCE DAILY 90 tablet 3  . JANUVIA 100 MG tablet TAKE 1 TABLET BY MOUTH ONCE DAILY 90 tablet 3  . loratadine (CLARITIN) 10 MG tablet TAKE 1 TABLET BY MOUTH ONCE DAILY AS NEEDED FOR ALLERGY 90 tablet 3  . losartan (COZAAR) 100 MG tablet TAKE 1 TABLET BY MOUTH ONCE DAILY 90 tablet 3  . metFORMIN (GLUCOPHAGE) 500 MG tablet TAKE 1 TABLET BY MOUTH TWICE DAILY WITH A MEAL 180 tablet 3  . metoprolol tartrate (LOPRESSOR) 50 MG tablet TAKE 1 TABLET BY MOUTH TWICE DAILY 180 tablet 3  . Multiple Vitamin (MULTIVITAMIN WITH MINERALS) TABS Take 1 tablet by mouth daily. Reported on 12/17/2015    . Polyethyl Glycol-Propyl Glycol (SYSTANE OP) Apply 1 drop to eye 2 (two) times daily as needed (dry eyes). Reported on 09/05/2015    . glimepiride (AMARYL) 4 MG tablet Take 1 tablet (4 mg total) by mouth daily with breakfast. 90 tablet 3   No facility-administered medications prior to visit.     Allergies  Allergen Reactions  . Lisinopril     REACTION: causes cough  . Sulfa Antibiotics     Told as a child allergic    ROS Review of Systems    Objective:    Physical Exam  BP 102/60   Pulse 74   Temp 97.9 F (36.6 C) (Oral)   Ht 5\' 2"  (1.575 m)   Wt 163 lb 9.6 oz (74.2 kg)   SpO2 97%   BMI 29.92 kg/m  Wt Readings from Last 3 Encounters:  05/26/18 163 lb 9.6 oz (74.2 kg)  03/10/18 165 lb (74.8 kg)  08/12/17 166 lb (75.3 kg)   Lungs clear Cardiac RRR without m or g Abd benign.  No CVA tenderness.  Health Maintenance Due  Topic Date Due  . OPHTHALMOLOGY EXAM  10/28/2017    There are no preventive care reminders to display for this patient.  Lab Results  Component Value Date   TSH 1.230 10/16/2012   Lab Results  Component Value Date   WBC 6.5 09/09/2016   HGB 12.5 09/09/2016   HCT 37.4 09/09/2016   MCV 82.7 09/09/2016   PLT 150 09/09/2016   Lab Results  Component Value Date   NA 143 08/17/2017  K 4.0  08/17/2017   CO2 22 08/17/2017   GLUCOSE 124 (H) 08/17/2017   BUN 23 08/17/2017   CREATININE 0.80 08/17/2017   BILITOT 0.8 07/09/2016   ALKPHOS 41 07/09/2016   AST 25 07/09/2016   ALT 15 07/09/2016   PROT 5.4 (L) 07/09/2016   ALBUMIN 2.6 (L) 07/09/2016   CALCIUM 9.2 08/17/2017   ANIONGAP 8 09/09/2016   Lab Results  Component Value Date   CHOL 181 08/17/2017   Lab Results  Component Value Date   HDL 37 (L) 08/17/2017   Lab Results  Component Value Date   LDLCALC 81 08/17/2017   Lab Results  Component Value Date   TRIG 315 (H) 08/17/2017   Lab Results  Component Value Date   CHOLHDL 4.9 (H) 08/17/2017   Lab Results  Component Value Date   HGBA1C 6.2 05/26/2018      Assessment & Plan:   Problem List Items Addressed This Visit    UTI (urinary tract infection)   Relevant Medications   cephALEXin (KEFLEX) 500 MG capsule   Type 2 diabetes mellitus with peripheral neuropathy ([place]) - Primary    DC amaryl.  Doubt effective at 10 y DM duration      Relevant Medications   pravastatin (PRAVACHOL) 40 MG tablet   Other Relevant Orders   HgB A1c (Completed)    Other Visit Diagnoses    Dysuria       Relevant Orders   POCT urinalysis dipstick (Completed)      Meds ordered this encounter  Medications  . cephALEXin (KEFLEX) 500 MG capsule    Sig: Take 1 capsule (500 mg total) by mouth 2 (two) times daily.    Dispense:  14 capsule    Refill:  0  . pravastatin (PRAVACHOL) 40 MG tablet    Sig: Take 1 tablet (40 mg total) by mouth daily.    Dispense:  30 tablet    Refill:  3    Follow-up: No follow-ups on file.    Zenia Resides, MD

## 2018-06-30 ENCOUNTER — Encounter: Payer: Self-pay | Admitting: Family Medicine

## 2018-08-18 ENCOUNTER — Other Ambulatory Visit: Payer: Self-pay | Admitting: Family Medicine

## 2018-08-18 DIAGNOSIS — I10 Essential (primary) hypertension: Secondary | ICD-10-CM

## 2018-09-02 ENCOUNTER — Other Ambulatory Visit: Payer: Self-pay | Admitting: Family Medicine

## 2018-09-02 DIAGNOSIS — I10 Essential (primary) hypertension: Secondary | ICD-10-CM

## 2018-09-02 DIAGNOSIS — E1142 Type 2 diabetes mellitus with diabetic polyneuropathy: Secondary | ICD-10-CM

## 2018-09-02 MED ORDER — LOSARTAN POTASSIUM 100 MG PO TABS: 100.0000 mg | ORAL_TABLET | Freq: Every day | ORAL | 3 refills | Status: DC

## 2018-09-02 NOTE — Addendum Note (Signed)
Addended by: Esau Grew on: 09/02/2018 02:05 PM   Modules accepted: Orders

## 2018-09-05 MED ORDER — SITAGLIPTIN PHOSPHATE 100 MG PO TABS: 100.0000 mg | ORAL_TABLET | Freq: Every day | ORAL | 3 refills | Status: DC

## 2018-09-05 NOTE — Addendum Note (Signed)
Addended by: Zenia Resides on: 09/05/2018 03:32 PM   Modules accepted: Orders

## 2018-09-08 ENCOUNTER — Other Ambulatory Visit: Payer: Self-pay | Admitting: Family Medicine

## 2018-09-08 DIAGNOSIS — E1142 Type 2 diabetes mellitus with diabetic polyneuropathy: Secondary | ICD-10-CM

## 2018-09-27 ENCOUNTER — Other Ambulatory Visit: Payer: Self-pay | Admitting: Family Medicine

## 2018-09-27 DIAGNOSIS — E1142 Type 2 diabetes mellitus with diabetic polyneuropathy: Secondary | ICD-10-CM

## 2018-10-07 ENCOUNTER — Other Ambulatory Visit: Payer: Self-pay | Admitting: Family Medicine

## 2018-10-07 DIAGNOSIS — I10 Essential (primary) hypertension: Secondary | ICD-10-CM

## 2018-12-12 ENCOUNTER — Ambulatory Visit (INDEPENDENT_AMBULATORY_CARE_PROVIDER_SITE_OTHER): Payer: Medicare HMO | Admitting: Family Medicine

## 2018-12-12 ENCOUNTER — Other Ambulatory Visit: Payer: Self-pay

## 2018-12-12 ENCOUNTER — Encounter: Payer: Self-pay | Admitting: Family Medicine

## 2018-12-12 VITALS — BP 102/60 | HR 69 | Ht 62.0 in | Wt 158.4 lb

## 2018-12-12 DIAGNOSIS — S91209A Unspecified open wound of unspecified toe(s) with damage to nail, initial encounter: Secondary | ICD-10-CM | POA: Diagnosis not present

## 2018-12-12 MED ORDER — DOXYCYCLINE HYCLATE 100 MG PO TABS: 100.0000 mg | ORAL_TABLET | Freq: Two times a day (BID) | ORAL | 0 refills | Status: AC

## 2018-12-12 NOTE — Patient Instructions (Signed)
  Take antibiotic twice a day with food for 7 days Wash toe daily with soap and warm water, pat dry Put on antibiotic ointment and lightly cover with guaze and tape  Follow up if redness spreads, more pain, discharge, fevers  Lucila Maine, DO PGY-3, [facility] 12/12/2018 4:35 PM

## 2018-12-12 NOTE — Progress Notes (Signed)
    Subjective:    Patient ID: Becky Fields, female    DOB: [birth year], 73 y.o.   MRN: [id number]   CC: toenail   HPI: hit toenail on edge of a rug at home several days ago and immediately had pain but did not think much of it. Woke up next day with a lot of blood on her sheets and toenail was bent back. Seems to be hanging on by small piece of nail. Shooting pain in toe maybe every 5 minutes. She has been using hydrogen peroxide and alcohol to clean it. Toe seems swollen now. She is worried about infection as she has diabetes.    Smoking status reviewed - non-smoker  Review of Systems- no fevers or chills   Objective:  BP 102/60   Pulse 69   Ht 5\' 2"  (1.575 m)   Wt 158 lb 6 oz (71.8 kg)   SpO2 96%   BMI 28.97 kg/m  Vitals and nursing note reviewed  General: well nourished, in no acute distress HEENT: normocephalic, MMM Cardiac: regular rate Respiratory: no increased work of breathing Extremities: no edema or cyanosis. Right first toe with avulsed nail, toe appears swollen and erythematous compared to left.  Skin: warm and dry Neuro: alert and oriented, no focal deficits   Assessment & Plan:   1. Avulsion of toenail, initial encounter Toenail removed with hemostat, no lidocaine used, no bleeding afterward. Advised patient to clean toe with warm water and soap, no H2O2 or alcohol. Pat dry and apply triple abx ointment, lightly cover with gauze and secure with tape. Supplies given to patient. Will give 7 day course of doxycycline as there is redness and swelling of toe and patient is diabetic. She has appt w/ PCP in 10 days for wellness check, return sooner if toe worsens.   Return if symptoms worsen or fail to improve.   Lucila Maine, DO Family Medicine Resident PGY-3

## 2018-12-22 ENCOUNTER — Ambulatory Visit (INDEPENDENT_AMBULATORY_CARE_PROVIDER_SITE_OTHER): Payer: Medicare HMO | Admitting: Family Medicine

## 2018-12-22 ENCOUNTER — Encounter: Payer: Self-pay | Admitting: Family Medicine

## 2018-12-22 ENCOUNTER — Other Ambulatory Visit: Payer: Self-pay

## 2018-12-22 VITALS — BP 102/66 | HR 69 | Wt 155.8 lb

## 2018-12-22 DIAGNOSIS — I10 Essential (primary) hypertension: Secondary | ICD-10-CM | POA: Diagnosis not present

## 2018-12-22 DIAGNOSIS — B182 Chronic viral hepatitis C: Secondary | ICD-10-CM

## 2018-12-22 DIAGNOSIS — Z0000 Encounter for general adult medical examination without abnormal findings: Secondary | ICD-10-CM | POA: Diagnosis not present

## 2018-12-22 DIAGNOSIS — E1142 Type 2 diabetes mellitus with diabetic polyneuropathy: Secondary | ICD-10-CM

## 2018-12-22 DIAGNOSIS — E781 Pure hyperglyceridemia: Secondary | ICD-10-CM | POA: Diagnosis not present

## 2018-12-22 DIAGNOSIS — Z1239 Encounter for other screening for malignant neoplasm of breast: Secondary | ICD-10-CM | POA: Diagnosis not present

## 2018-12-22 LAB — POCT GLYCOSYLATED HEMOGLOBIN (HGB A1C): HbA1c, POC (controlled diabetic range): 6.3 % (ref 0.0–7.0)

## 2018-12-22 NOTE — Assessment & Plan Note (Signed)
Ordered screening mammo. 

## 2018-12-22 NOTE — Assessment & Plan Note (Signed)
Healthy with chronic problems well controled.

## 2018-12-22 NOTE — Progress Notes (Signed)
21 

## 2018-12-22 NOTE — Progress Notes (Signed)
Established Patient Office Visit  Subjective:  Patient ID: Becky Fields, female    DOB: 1945/10/20  Age: 73 y.o. MRN: [id number]  CC:  Chief Complaint  Patient presents with  . Annual Exam    HPI Becky Fields presents for annual exam.  Becky Fields feels good.  She is active, has lost weight and feels her medical problems are well managed.  She has no specific concerns this visit.  Issues 1. DM Continues on Metformin and Januvia.  Home blood sugars are running quite good.  A1C today is 6.3  She is on a statin for primary prevention.  2. HBP BPs have been consistently low.  She is on ARB, Beta blocker and HCTZ.  No lightheaded spells.  At one point she had CHF but that has resolved. 3. ASCVD risk.  Primary prevention.  Check cholesterol.  OK to DC ASA since primary prevention and she is now above 70. 4. HPDP.  Needs mammo and eye exam by my records.     Past Medical History:  Diagnosis Date  . Cataract   . Diabetes mellitus   . Ejection fraction    .  Marland Kitchen Hepatitis C   . Hypertension   . Hypertriglyceridemia   . Takotsubo syndrome    October 16, 2012    Past Surgical History:  Procedure Laterality Date  . CHOLECYSTECTOMY    . COLONOSCOPY    . EYE SURGERY  02/2012   bilateral cataract surg  . LEFT HEART CATHETERIZATION WITH CORONARY ANGIOGRAM N/A 10/17/2012   Procedure: LEFT HEART CATHETERIZATION WITH CORONARY ANGIOGRAM;  Surgeon: Sherren Mocha, MD;  Location: [facility] CATH LAB;  Service: Cardiovascular;  Laterality: N/A;    Family History  Problem Relation Age of Onset  . Aneurysm Father   . Hypertension Mother   . CAD Brother 47  . Hypertension Sister   . Heart attack Neg Hx   . Stroke Neg Hx   . Colon cancer Neg Hx     Social History   Socioeconomic History  . Marital status: Widowed    Spouse name: Becky Fields  . Number of children: 2  . Years of education: Bachelors  . Highest education level: Not on file  Occupational History  . Occupation: Control and instrumentation engineer:  [employer]  Social Needs  . Financial resource strain: Not on file  . Food insecurity    Worry: Not on file    Inability: Not on file  . Transportation needs    Medical: Not on file    Non-medical: Not on file  Tobacco Use  . Smoking status: Never Smoker  . Smokeless tobacco: Never Used  Substance and Sexual Activity  . Alcohol use: Yes    Alcohol/week: 6.0 standard drinks    Types: 6 Standard drinks or equivalent per week    Comment: Occasional glass of wine on the weekends  . Drug use: No  . Sexual activity: Not Currently  Lifestyle  . Physical activity    Days per week: Not on file    Minutes per session: Not on file  . Stress: Not on file  Relationships  . Social Herbalist on phone: Not on file    Gets together: Not on file    Attends religious service: Not on file    Active member of club or organization: Not on file    Attends meetings of clubs or organizations: Not on file    Relationship status: Not on file  .  Intimate partner violence    Fear of current or ex partner: Not on file    Emotionally abused: Not on file    Physically abused: Not on file    Forced sexual activity: Not on file  Other Topics Concern  . Not on file  Social History Narrative   Health Care POA:    Emergency Contact: husband, Becky Fields (c) [phone]   End of Life Plan:    Who lives with you: husband, Becky Fields, occasionally son who works out of town   Any pets: 2 dogs, 2 cats   Diet: Pt has a varied diet of protein, starch, vegetables.  Pt is not currently on any kind of diet modifications for DM control.   Exercise: Pt has not regular exercise routine.   Seatbelts: Pt reports wearing seatbelt when in vehicles.    Sun Exposure/Protection: Pt reports not wearing sun protection.   Hobbies: Spending time with granddaughter, reading          Outpatient Medications Prior to Visit  Medication Sig Dispense Refill  . fluticasone (FLONASE) 50 MCG/ACT nasal spray Place 2 sprays into both  nostrils daily. (Patient taking differently: Place 2 sprays into both nostrils 2 (two) times daily as needed for allergies. ) 16 g 12  . loratadine (CLARITIN) 10 MG tablet TAKE 1 TABLET BY MOUTH ONCE DAILY AS NEEDED FOR ALLERGY 90 tablet 3  . losartan (COZAAR) 100 MG tablet Take 1 tablet (100 mg total) by mouth daily. 90 tablet 3  . metFORMIN (GLUCOPHAGE) 500 MG tablet TAKE 1 TABLET BY MOUTH TWICE DAILY WITH A MEAL 180 tablet 3  . metoprolol tartrate (LOPRESSOR) 50 MG tablet Take 1 tablet by mouth twice daily 180 tablet 3  . Multiple Vitamin (MULTIVITAMIN WITH MINERALS) TABS Take 1 tablet by mouth daily. Reported on 12/17/2015    . Polyethyl Glycol-Propyl Glycol (SYSTANE OP) Apply 1 drop to eye 2 (two) times daily as needed (dry eyes). Reported on 09/05/2015    . pravastatin (PRAVACHOL) 40 MG tablet Take 1 tablet by mouth once daily 90 tablet 3  . sitaGLIPtin (JANUVIA) 100 MG tablet Take 1 tablet (100 mg total) by mouth daily. 90 tablet 3  . aspirin 81 MG tablet Take 81 mg by mouth daily.      . cephALEXin (KEFLEX) 500 MG capsule Take 1 capsule (500 mg total) by mouth 2 (two) times daily. (Patient not taking: Reported on 12/12/2018) 14 capsule 0  . hydrochlorothiazide (HYDRODIURIL) 12.5 MG tablet Take 1 tablet by mouth once daily 90 tablet 3   No facility-administered medications prior to visit.     Allergies  Allergen Reactions  . Lisinopril     REACTION: causes cough  . Sulfa Antibiotics     Told as a child allergic    ROS Review of Systems Denies CP, SOB, abd pain, headaches and depression.  Denies change in bowel, bladder, appetite or weight. Does have numbness both feet.  Known diabetic neuropathy.   Objective:    Physical Exam  BP 102/66   Pulse 69   Wt 155 lb 12.8 oz (70.7 kg)   SpO2 98%   BMI 28.50 kg/m  Wt Readings from Last 3 Encounters:  12/22/18 155 lb 12.8 oz (70.7 kg)  12/12/18 158 lb 6 oz (71.8 kg)  05/26/18 163 lb 9.6 oz (74.2 kg)  HEENT WNL Neck supple   Lungs clear Cardiac RRR without m or g Abd benign Ext WNL  Diabetic foot exam done.  + neuropathy.  Neuro Motor and snsory nromal.     Health Maintenance Due  Topic Date Due  . MAMMOGRAM  06/18/2018  . OPHTHALMOLOGY EXAM  10/30/2018  . HEMOGLOBIN A1C  11/25/2018    There are no preventive care reminders to display for this patient.  Lab Results  Component Value Date   TSH 1.230 10/16/2012   Lab Results  Component Value Date   WBC 6.5 09/09/2016   HGB 12.5 09/09/2016   HCT 37.4 09/09/2016   MCV 82.7 09/09/2016   PLT 150 09/09/2016   Lab Results  Component Value Date   NA 143 08/17/2017   K 4.0 08/17/2017   CO2 22 08/17/2017   GLUCOSE 124 (H) 08/17/2017   BUN 23 08/17/2017   CREATININE 0.80 08/17/2017   BILITOT 0.8 07/09/2016   ALKPHOS 41 07/09/2016   AST 25 07/09/2016   ALT 15 07/09/2016   PROT 5.4 (L) 07/09/2016   ALBUMIN 2.6 (L) 07/09/2016   CALCIUM 9.2 08/17/2017   ANIONGAP 8 09/09/2016   Lab Results  Component Value Date   CHOL 181 08/17/2017   Lab Results  Component Value Date   HDL 37 (L) 08/17/2017   Lab Results  Component Value Date   LDLCALC 81 08/17/2017   Lab Results  Component Value Date   TRIG 315 (H) 08/17/2017   Lab Results  Component Value Date   CHOLHDL 4.9 (H) 08/17/2017   Lab Results  Component Value Date   HGBA1C 6.3 12/22/2018      Assessment & Plan:   Problem List Items Addressed This Visit    Chronic hepatitis C without hepatic coma ([place])    Needs annual [place] screening      Relevant Orders   US Abdomen Limited RUQ   HYPERTENSION, BENIGN ESSENTIAL    Very well controled.  Never had CAD.  So ASA is primary prevention which ends at age 77.  Patient instructed to stop asa      Relevant Orders   Lipid panel   CMP14+EGFR   Screening for breast cancer   Relevant Orders   MM 3D SCREEN BREAST BILATERAL   Type 2 diabetes mellitus with peripheral neuropathy (HCC) - Primary   Relevant Orders   POCT glycosylated  hemoglobin (Hb A1C) (Completed)      No orders of the defined types were placed in this encounter.   Follow-up: No follow-ups on file.    Zenia Resides, MD

## 2018-12-22 NOTE — Assessment & Plan Note (Signed)
Needs annual [place] screening

## 2018-12-22 NOTE — Patient Instructions (Addendum)
Someone should call to schedule the annual liver ultrasound. Stop the Aspirin and the hydrochlorothiazide.   Check on your last eye exam.  By my records, the last one was 10/29/2017 You are due for a mammogram Great work with your diabetes and your [facility] See me in six months.  Maybe check your blood pressure several times.

## 2018-12-22 NOTE — Assessment & Plan Note (Signed)
Not due for FLP.  Will check direct LDL.

## 2018-12-22 NOTE — Assessment & Plan Note (Signed)
Stable and at goal on current meds.

## 2018-12-22 NOTE — Assessment & Plan Note (Addendum)
Very well controled.  Never had CAD.  So ASA is primary prevention which ends at age 73.  Patient instructed to stop asa Stop HCTZ

## 2018-12-23 LAB — CMP14+EGFR
ALT: 13 IU/L (ref 0–32)
AST: 15 IU/L (ref 0–40)
Albumin/Globulin Ratio: 1.6 (ref 1.2–2.2)
Albumin: 4.2 g/dL (ref 3.7–4.7)
Alkaline Phosphatase: 61 IU/L (ref 39–117)
BUN/Creatinine Ratio: 32 — ABNORMAL HIGH (ref 12–28)
BUN: 25 mg/dL (ref 8–27)
Bilirubin Total: 0.6 mg/dL (ref 0.0–1.2)
CO2: 22 mmol/L (ref 20–29)
Calcium: 9.7 mg/dL (ref 8.7–10.3)
Chloride: 101 mmol/L (ref 96–106)
Creatinine, Ser: 0.79 mg/dL (ref 0.57–1.00)
GFR calc Af Amer: 86 mL/min/{1.73_m2} (ref >59)
GFR calc non Af Amer: 75 mL/min/{1.73_m2} (ref >59)
Globulin, Total: 2.7 g/dL (ref 1.5–4.5)
Glucose: 98 mg/dL (ref 65–99)
Potassium: 4.2 mmol/L (ref 3.5–5.2)
Sodium: 140 mmol/L (ref 134–144)
Total Protein: 6.9 g/dL (ref 6.0–8.5)

## 2018-12-23 LAB — LIPID PANEL
Chol/HDL Ratio: 4.1 ratio (ref 0.0–4.4)
Cholesterol, Total: 145 mg/dL (ref 100–199)
HDL: 35 mg/dL — ABNORMAL LOW (ref >39)
LDL Calculated: 33 mg/dL (ref 0–99)
Triglycerides: 383 mg/dL — ABNORMAL HIGH (ref 0–149)
VLDL Cholesterol Cal: 77 mg/dL — ABNORMAL HIGH (ref 5–40)

## 2019-01-04 ENCOUNTER — Other Ambulatory Visit: Payer: Medicare HMO

## 2019-01-06 ENCOUNTER — Other Ambulatory Visit: Payer: Self-pay | Admitting: Family Medicine

## 2019-01-11 ENCOUNTER — Other Ambulatory Visit: Payer: Self-pay

## 2019-01-11 ENCOUNTER — Ambulatory Visit
Admission: RE | Admit: 2019-01-11 | Discharge: 2019-01-11 | Disposition: A | Discharge status: Home / Self Care | Payer: Medicare HMO | Source: Ambulatory Visit | Attending: Family Medicine | Admitting: Family Medicine

## 2019-01-11 DIAGNOSIS — B182 Chronic viral hepatitis C: Secondary | ICD-10-CM | POA: Diagnosis not present

## 2019-02-02 ENCOUNTER — Ambulatory Visit
Admission: RE | Admit: 2019-02-02 | Discharge: 2019-02-02 | Disposition: A | Discharge status: Home / Self Care | Payer: Medicare HMO | Source: Ambulatory Visit | Attending: Family Medicine | Admitting: Family Medicine

## 2019-02-02 ENCOUNTER — Other Ambulatory Visit: Payer: Self-pay

## 2019-02-02 DIAGNOSIS — Z1239 Encounter for other screening for malignant neoplasm of breast: Secondary | ICD-10-CM

## 2019-02-02 DIAGNOSIS — Z1231 Encounter for screening mammogram for malignant neoplasm of breast: Secondary | ICD-10-CM | POA: Diagnosis not present

## 2019-02-08 DIAGNOSIS — H43821 Vitreomacular adhesion, right eye: Secondary | ICD-10-CM | POA: Diagnosis not present

## 2019-02-08 DIAGNOSIS — H5203 Hypermetropia, bilateral: Secondary | ICD-10-CM | POA: Diagnosis not present

## 2019-02-08 DIAGNOSIS — E119 Type 2 diabetes mellitus without complications: Secondary | ICD-10-CM | POA: Diagnosis not present

## 2019-02-08 DIAGNOSIS — H43811 Vitreous degeneration, right eye: Secondary | ICD-10-CM | POA: Diagnosis not present

## 2019-02-08 DIAGNOSIS — Z961 Presence of intraocular lens: Secondary | ICD-10-CM | POA: Diagnosis not present

## 2019-02-08 DIAGNOSIS — H04123 Dry eye syndrome of bilateral lacrimal glands: Secondary | ICD-10-CM | POA: Diagnosis not present

## 2019-02-08 DIAGNOSIS — H11153 Pinguecula, bilateral: Secondary | ICD-10-CM | POA: Diagnosis not present

## 2019-02-08 LAB — HM DIABETES EYE EXAM

## 2019-03-03 ENCOUNTER — Other Ambulatory Visit: Payer: Self-pay

## 2019-03-03 DIAGNOSIS — Z20822 Contact with and (suspected) exposure to covid-19: Secondary | ICD-10-CM

## 2019-03-03 DIAGNOSIS — R6889 Other general symptoms and signs: Secondary | ICD-10-CM | POA: Diagnosis not present

## 2019-03-04 LAB — NOVEL CORONAVIRUS, NAA: SARS-CoV-2, NAA: NOT DETECTED

## 2019-03-30 ENCOUNTER — Encounter: Payer: Self-pay | Admitting: Family Medicine

## 2019-04-13 ENCOUNTER — Other Ambulatory Visit: Payer: Self-pay

## 2019-04-13 ENCOUNTER — Telehealth (INDEPENDENT_AMBULATORY_CARE_PROVIDER_SITE_OTHER): Payer: Medicare HMO | Admitting: Family Medicine

## 2019-04-13 DIAGNOSIS — J069 Acute upper respiratory infection, unspecified: Secondary | ICD-10-CM | POA: Diagnosis not present

## 2019-04-13 MED ORDER — ALBUTEROL SULFATE HFA 108 (90 BASE) MCG/ACT IN AERS: 2.0000 | INHALATION_SPRAY | RESPIRATORY_TRACT | 0 refills | Status: DC | PRN

## 2019-04-13 NOTE — Progress Notes (Signed)
[facility] Telemedicine Visit  Patient consented to have virtual visit. Method of visit: Telephone  Encounter participants: Patient: Becky Fields - located at home Provider: [place] Kamylle Axelson - located at [facility]  Others (if applicable): n/a  Chief Complaint: cold  HPI: She is having cold symtpoms. Laying down at night, she has noticed that she is wheezing. She has had bronchitis a couple of years. She has no history of asthma or COPD but has had some albuterol in the past. She had had some minimal shortness of breath, but has improved. Is reporting some wheezing at night.  Saturday she had a temperature. She has had some congestion. She has been taking mucinex. No known COVID contacts. Has been tested once before. 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  She is not interested in a test at this time as it would not change her behavior.   ROS: per HPI  Pertinent PMHx: HTN, chronic hep c, cirrhosis, T2DM, MR  Exam:  Respiratory: able to speak in complete sentences without issue  Assessment/Plan:  She will call if she desires testing. No hx of lung pathology.  -albuterol for wheezing, has required before, no red flags at this time -ED precautions discussed and patient expressed good understanding -Patient counseled on wearing a mask, washing hands -Patient instructed to avoid others until she meets criteria for ending isolation after any suspected COVID, which are:   3 days with no fever and   Respiratory symptoms have improved (e.g. cough, shortness of breath) or   10 days since symptoms first appeared   Time spent during visit with patient: 7 minutes  [place] Kendy Haston, DO PGY-3, [place]

## 2019-04-19 ENCOUNTER — Ambulatory Visit (INDEPENDENT_AMBULATORY_CARE_PROVIDER_SITE_OTHER): Payer: Medicare HMO

## 2019-04-19 ENCOUNTER — Other Ambulatory Visit: Payer: Self-pay

## 2019-04-19 DIAGNOSIS — Z23 Encounter for immunization: Secondary | ICD-10-CM

## 2019-04-19 NOTE — Progress Notes (Signed)
Patient presents in nurse clinic for flu vaccine. Vaccine given LD, site unremarkable.

## 2019-07-26 ENCOUNTER — Other Ambulatory Visit: Payer: Self-pay | Admitting: Family Medicine

## 2019-07-26 DIAGNOSIS — I10 Essential (primary) hypertension: Secondary | ICD-10-CM

## 2019-08-05 ENCOUNTER — Ambulatory Visit: Payer: Medicare HMO | Attending: Internal Medicine

## 2019-08-05 DIAGNOSIS — Z23 Encounter for immunization: Secondary | ICD-10-CM | POA: Insufficient documentation

## 2019-08-05 NOTE — Progress Notes (Signed)
   Covid-19 Vaccination Clinic  Name:  Becky Fields    MRN: [id number] DOB: 01-09-1946  08/05/2019  [name] was observed post Covid-19 immunization for 15 minutes without incidence. She was provided with Vaccine Information Sheet and instruction to access the V-Safe system.   [name] was instructed to call 911 with any severe reactions post vaccine: Marland Kitchen Difficulty breathing  . Swelling of your face and throat  . A fast heartbeat  . A bad rash all over your body  . Dizziness and weakness    Immunizations Administered    Name Date Dose VIS Date Route   Pfizer COVID-19 Vaccine 08/05/2019  9:08 AM 0.3 mL 06/02/2019 Intramuscular   Manufacturer: [place]   Lot: [id number]   [place]: [id number]

## 2019-08-27 ENCOUNTER — Ambulatory Visit: Payer: Medicare HMO | Attending: Internal Medicine

## 2019-08-27 DIAGNOSIS — Z23 Encounter for immunization: Secondary | ICD-10-CM

## 2019-08-27 NOTE — Progress Notes (Signed)
   Covid-19 Vaccination Clinic  Name:  Becky Fields    MRN: [id number] DOB: 1945-08-17  08/27/2019  Becky Fields was observed post Covid-19 immunization for 15 minutes without incident. She was provided with Vaccine Information Sheet and instruction to access the V-Safe system.   Becky Fields was instructed to call 911 with any severe reactions post vaccine: Marland Kitchen Difficulty breathing  . Swelling of face and throat  . A fast heartbeat  . A bad rash all over body  . Dizziness and weakness   Immunizations Administered    Name Date Dose VIS Date Route   Pfizer COVID-19 Vaccine 08/27/2019  1:05 PM 0.3 mL 06/02/2019 Intramuscular   Manufacturer: [place]   Lot: [id number]   [place]: [id number]

## 2019-09-05 ENCOUNTER — Other Ambulatory Visit: Payer: Self-pay | Admitting: Family Medicine

## 2019-09-05 DIAGNOSIS — E1142 Type 2 diabetes mellitus with diabetic polyneuropathy: Secondary | ICD-10-CM

## 2019-09-05 DIAGNOSIS — I10 Essential (primary) hypertension: Secondary | ICD-10-CM

## 2019-09-20 ENCOUNTER — Other Ambulatory Visit: Payer: Self-pay | Admitting: Family Medicine

## 2019-09-20 DIAGNOSIS — E1142 Type 2 diabetes mellitus with diabetic polyneuropathy: Secondary | ICD-10-CM

## 2019-10-04 ENCOUNTER — Other Ambulatory Visit: Payer: Self-pay | Admitting: Family Medicine

## 2019-10-04 DIAGNOSIS — E1142 Type 2 diabetes mellitus with diabetic polyneuropathy: Secondary | ICD-10-CM

## 2019-10-25 ENCOUNTER — Other Ambulatory Visit: Payer: Self-pay | Admitting: Family Medicine

## 2019-10-25 DIAGNOSIS — E1142 Type 2 diabetes mellitus with diabetic polyneuropathy: Secondary | ICD-10-CM

## 2019-11-27 ENCOUNTER — Encounter: Payer: Self-pay | Admitting: Family Medicine

## 2019-11-27 ENCOUNTER — Ambulatory Visit (INDEPENDENT_AMBULATORY_CARE_PROVIDER_SITE_OTHER): Payer: Medicare HMO | Admitting: Family Medicine

## 2019-11-27 ENCOUNTER — Other Ambulatory Visit: Payer: Self-pay

## 2019-11-27 VITALS — BP 118/62 | HR 59 | Ht 62.0 in | Wt 155.4 lb

## 2019-11-27 DIAGNOSIS — E781 Pure hyperglyceridemia: Secondary | ICD-10-CM | POA: Diagnosis not present

## 2019-11-27 DIAGNOSIS — I10 Essential (primary) hypertension: Secondary | ICD-10-CM | POA: Diagnosis not present

## 2019-11-27 DIAGNOSIS — E1142 Type 2 diabetes mellitus with diabetic polyneuropathy: Secondary | ICD-10-CM

## 2019-11-27 LAB — POCT GLYCOSYLATED HEMOGLOBIN (HGB A1C): HbA1c, POC (controlled diabetic range): 5.8 % (ref 0.0–7.0)

## 2019-11-27 MED ORDER — METOPROLOL SUCCINATE ER 50 MG PO TB24: 50.0000 mg | ORAL_TABLET | Freq: Every day | ORAL | 3 refills | Status: DC

## 2019-11-27 MED ORDER — ZOSTER VAC RECOMB ADJUVANTED 50 MCG/0.5ML IM SUSR: 0.5000 mL | Freq: Once | INTRAMUSCULAR | 1 refills | Status: AC

## 2019-11-27 NOTE — Assessment & Plan Note (Signed)
DC Becky Fields Shaved Right great toe.  FU one month.

## 2019-11-27 NOTE — Patient Instructions (Addendum)
Stop the Januvia.  See me in 3 months to check that your diabetes stays under control. I switched your metoprolol to a once-a-day dose that is smaller that the current metoprolol. Keep an eye on that toe.  I am happy to see you more frequently to trim it.   Get your fasting blood work done any time after 12/22/19. I sent in the new shingles vaccine.  Remember, side effects are commone .

## 2019-11-27 NOTE — Progress Notes (Signed)
    SUBJECTIVE:   CHIEF COMPLAINT / HPI:   FU multiple chronic issues.  Becky Fields's goal is to decrease the number of pills she needs to take.  Issues: 1. DM great control on current meds.  No hypoglycemic symptoms.   2. HBP.  Likely over treated.  She does have some fatigue.  No lightheaded spells.  No CP or SOB. 3. Diabetic neuropathy.  Has worsening callous of right great toe.  This is the area in which she had an ulcer that took 1 year to heal. 4. Has not yet had shingrix.  She is interested. 5. Due for lipids on or after 7/2    OBJECTIVE:   BP 118/62   Pulse (!) 59   Ht 5\' 2"  (1.575 m)   Wt 155 lb 6.4 oz (70.5 kg)   SpO2 98%   BMI 28.42 kg/m   Lungs clear Cardiac RRR without m or g Right great toe, callous on plantar surface. Shaved.  No central ulcer but does have some central bleeding within the callous suggestive of avulsion forces.    ASSESSMENT/PLAN:   Type 2 diabetes mellitus with peripheral neuropathy (HCC) DC januvia Shaved Right great toe.  FU one month.  HYPERTRIGLYCERIDEMIA Fasting lipid prior to next visit.  HYPERTENSION, BENIGN ESSENTIAL Likely over treated.  Decrease metoprolol daily dose from 100 to 50 mg.     Becky Resides, MD [place]

## 2019-11-27 NOTE — Assessment & Plan Note (Signed)
Likely over treated.  Decrease metoprolol daily dose from 100 to 50 mg.

## 2019-11-27 NOTE — Assessment & Plan Note (Signed)
Fasting lipid prior to next visit.

## 2020-02-01 ENCOUNTER — Other Ambulatory Visit: Payer: Self-pay

## 2020-02-01 ENCOUNTER — Other Ambulatory Visit: Payer: Medicare HMO

## 2020-02-01 DIAGNOSIS — E781 Pure hyperglyceridemia: Secondary | ICD-10-CM | POA: Diagnosis not present

## 2020-02-02 LAB — CMP14+EGFR
ALT: 11 IU/L (ref 0–32)
AST: 13 IU/L (ref 0–40)
Albumin/Globulin Ratio: 1.4 (ref 1.2–2.2)
Albumin: 4 g/dL (ref 3.7–4.7)
Alkaline Phosphatase: 68 IU/L (ref 48–121)
BUN/Creatinine Ratio: 31 — ABNORMAL HIGH (ref 12–28)
BUN: 22 mg/dL (ref 8–27)
Bilirubin Total: 0.6 mg/dL (ref 0.0–1.2)
CO2: 23 mmol/L (ref 20–29)
Calcium: 9.3 mg/dL (ref 8.7–10.3)
Chloride: 103 mmol/L (ref 96–106)
Creatinine, Ser: 0.7 mg/dL (ref 0.57–1.00)
GFR calc Af Amer: 99 mL/min/{1.73_m2} (ref >59)
GFR calc non Af Amer: 86 mL/min/{1.73_m2} (ref >59)
Globulin, Total: 2.8 g/dL (ref 1.5–4.5)
Glucose: 122 mg/dL — ABNORMAL HIGH (ref 65–99)
Potassium: 4.5 mmol/L (ref 3.5–5.2)
Sodium: 139 mmol/L (ref 134–144)
Total Protein: 6.8 g/dL (ref 6.0–8.5)

## 2020-02-02 LAB — LIPID PANEL
Chol/HDL Ratio: 3.9 ratio (ref 0.0–4.4)
Cholesterol, Total: 169 mg/dL (ref 100–199)
HDL: 43 mg/dL (ref >39)
LDL Chol Calc (NIH): 77 mg/dL (ref 0–99)
Triglycerides: 301 mg/dL — ABNORMAL HIGH (ref 0–149)
VLDL Cholesterol Cal: 49 mg/dL — ABNORMAL HIGH (ref 5–40)

## 2020-02-02 LAB — TSH: TSH: 1.78 u[IU]/mL (ref 0.450–4.500)

## 2020-02-03 ENCOUNTER — Telehealth: Payer: Self-pay | Admitting: Family Medicine

## 2020-02-03 NOTE — Telephone Encounter (Signed)
Called and LM that lab tests OK.  No changes.

## 2020-02-20 ENCOUNTER — Encounter: Payer: Self-pay | Admitting: Family Medicine

## 2020-02-20 ENCOUNTER — Telehealth: Payer: Self-pay

## 2020-02-20 DIAGNOSIS — H35362 Drusen (degenerative) of macula, left eye: Secondary | ICD-10-CM | POA: Diagnosis not present

## 2020-02-20 DIAGNOSIS — E1142 Type 2 diabetes mellitus with diabetic polyneuropathy: Secondary | ICD-10-CM

## 2020-02-20 DIAGNOSIS — H5203 Hypermetropia, bilateral: Secondary | ICD-10-CM | POA: Diagnosis not present

## 2020-02-20 DIAGNOSIS — E119 Type 2 diabetes mellitus without complications: Secondary | ICD-10-CM | POA: Diagnosis not present

## 2020-02-20 DIAGNOSIS — H52222 Regular astigmatism, left eye: Secondary | ICD-10-CM | POA: Diagnosis not present

## 2020-02-20 DIAGNOSIS — H04123 Dry eye syndrome of bilateral lacrimal glands: Secondary | ICD-10-CM | POA: Diagnosis not present

## 2020-02-20 DIAGNOSIS — H524 Presbyopia: Secondary | ICD-10-CM | POA: Diagnosis not present

## 2020-02-20 NOTE — Telephone Encounter (Signed)
Called and LM that I want to discuss.  Will call back later.

## 2020-02-20 NOTE — Telephone Encounter (Signed)
Patient calls nurse line reporting she has been taking OTC imodium for quite some time now. Patient reports taking metformin causes her to have a lot of diarrhea, hence the imodium. Patient reports she doesn't mind paying for it, however it does get expensive, and she is wondering if a prescription form would be more affordable. Please advise.

## 2020-02-22 MED ORDER — METFORMIN HCL ER 500 MG PO TB24: 1000.0000 mg | ORAL_TABLET | Freq: Every day | ORAL | 3 refills | Status: DC

## 2020-02-22 NOTE — Telephone Encounter (Signed)
Attemped to call again.  LM

## 2020-02-22 NOTE — Telephone Encounter (Signed)
Chronic diarrhea.  She knows metformin makes it worse.  Rather than Rx imodium, will try metformin XR to see if less diarrhea.

## 2020-02-28 DIAGNOSIS — H10812 Pingueculitis, left eye: Secondary | ICD-10-CM | POA: Diagnosis not present

## 2020-02-28 DIAGNOSIS — H524 Presbyopia: Secondary | ICD-10-CM | POA: Diagnosis not present

## 2020-02-28 DIAGNOSIS — H5203 Hypermetropia, bilateral: Secondary | ICD-10-CM | POA: Diagnosis not present

## 2020-02-28 DIAGNOSIS — H52222 Regular astigmatism, left eye: Secondary | ICD-10-CM | POA: Diagnosis not present

## 2020-02-28 LAB — HM DIABETES EYE EXAM

## 2020-03-20 ENCOUNTER — Other Ambulatory Visit: Payer: Self-pay

## 2020-03-20 ENCOUNTER — Ambulatory Visit (INDEPENDENT_AMBULATORY_CARE_PROVIDER_SITE_OTHER): Payer: Medicare HMO | Admitting: Family Medicine

## 2020-03-20 ENCOUNTER — Encounter: Payer: Self-pay | Admitting: Family Medicine

## 2020-03-20 VITALS — BP 128/68 | HR 69 | Ht 62.0 in | Wt 152.8 lb

## 2020-03-20 DIAGNOSIS — Z23 Encounter for immunization: Secondary | ICD-10-CM

## 2020-03-20 DIAGNOSIS — K7460 Unspecified cirrhosis of liver: Secondary | ICD-10-CM | POA: Diagnosis not present

## 2020-03-20 DIAGNOSIS — I5181 Takotsubo syndrome: Secondary | ICD-10-CM

## 2020-03-20 DIAGNOSIS — B182 Chronic viral hepatitis C: Secondary | ICD-10-CM | POA: Diagnosis not present

## 2020-03-20 DIAGNOSIS — E1142 Type 2 diabetes mellitus with diabetic polyneuropathy: Secondary | ICD-10-CM | POA: Diagnosis not present

## 2020-03-20 LAB — POCT GLYCOSYLATED HEMOGLOBIN (HGB A1C): HbA1c, POC (controlled diabetic range): 6 % (ref 0.0–7.0)

## 2020-03-20 NOTE — Assessment & Plan Note (Signed)
History of treated Hep C.  Needs annual RUQ ultrasound for hepatocellular carcinoma screening.

## 2020-03-20 NOTE — Patient Instructions (Signed)
Flu shot today COVID booster at your convenience.  Call here for an appointment. Remind me each year to put in a referral to your eye doctor. Remind me each year to order a liver ultrasound.   Keep an eye on your toe.  I am always happy to trim the callous. I think you can skip the cardiology follow up.

## 2020-03-21 ENCOUNTER — Encounter: Payer: Self-pay | Admitting: Family Medicine

## 2020-03-21 NOTE — Progress Notes (Signed)
    SUBJECTIVE:   CHIEF COMPLAINT / HPI:   FU multiple issues: 1. DM.  Great control per A1C.  Slow steady weight loss continues. Tolerating current dose of metformin well.  Just had diabetic eye exam.  Foot exam will be done as part of this visit. 2. Diabetic peripheral neuropathy with neuropathic callouses.  The callous on the problematic Right great toe is again bothersome. 3. Hx of broken heart syndrome.  Fully asymptomatic.  Echo x 2 post episode are normal.  Wants to avoid cardiology FU if possible to save money. 4. Hx of hep c and cirrhosis, successfully treated.  Needs annual RUQ ultrasound for hepatocellular carcinoma screening. 5. Just had eye exam.  Apparently insurance would not pay because I did not put in referral.  She will remind me to do so in the future. 6. Will get flu shot today.  Will get COVID booster at her convenience.     OBJECTIVE:   BP 128/68   Pulse 69   Ht 5\' 2"  (1.575 m)   Wt 152 lb 12.8 oz (69.3 kg)   SpO2 96%   BMI 27.95 kg/m   Lungs clear Cardiac RRR without m or g Abd benign Ext no edema Right great toe thick callous debrided.  No ulcer.  ASSESSMENT/PLAN:   Hepatic cirrhosis History of treated Hep C.  Needs annual RUQ ultrasound for hepatocellular carcinoma screening.  Type 2 diabetes mellitus with peripheral neuropathy (HCC) Neuropathic changes of right great toe.  Great dm control.  Takotsubo syndrome Remote history.  Cleared now and asymptomatic.  Will only refer back to cards if becomes symptomatic.     Zenia Resides, MD [place]

## 2020-03-21 NOTE — Assessment & Plan Note (Signed)
Neuropathic changes of right great toe.  Great dm control.

## 2020-03-21 NOTE — Assessment & Plan Note (Signed)
Remote history.  Cleared now and asymptomatic.  Will only refer back to cards if becomes symptomatic.

## 2020-03-25 ENCOUNTER — Ambulatory Visit
Admission: RE | Admit: 2020-03-25 | Discharge: 2020-03-25 | Disposition: A | Discharge status: Home / Self Care | Payer: Medicare HMO | Source: Ambulatory Visit | Attending: Family Medicine | Admitting: Family Medicine

## 2020-03-25 DIAGNOSIS — K7460 Unspecified cirrhosis of liver: Secondary | ICD-10-CM

## 2020-03-25 DIAGNOSIS — B1920 Unspecified viral hepatitis C without hepatic coma: Secondary | ICD-10-CM | POA: Diagnosis not present

## 2020-03-27 ENCOUNTER — Other Ambulatory Visit: Payer: Medicare HMO

## 2020-04-08 ENCOUNTER — Other Ambulatory Visit: Payer: Self-pay

## 2020-04-08 ENCOUNTER — Ambulatory Visit (INDEPENDENT_AMBULATORY_CARE_PROVIDER_SITE_OTHER): Payer: Medicare HMO

## 2020-04-08 DIAGNOSIS — Z23 Encounter for immunization: Secondary | ICD-10-CM

## 2020-04-08 NOTE — Progress Notes (Signed)
° °  Covid-19 Vaccination Clinic  Name:  Jamiyla Ishee    MRN: [id number] DOB: 23-Oct-1945  04/08/2020  Ms. Rumer was observed post Covid-19 immunization for 15 minutes without incident. She was provided with Vaccine Information Sheet and instruction to access the V-Safe system.   Ms. Kerce was instructed to call 911 with any severe reactions post vaccine:  Difficulty breathing   Swelling of face and throat   A fast heartbeat   A bad rash all over body   Dizziness and weakness   Covid Booster administered LD without complication.

## 2020-05-08 ENCOUNTER — Other Ambulatory Visit: Payer: Self-pay

## 2020-05-08 DIAGNOSIS — E1142 Type 2 diabetes mellitus with diabetic polyneuropathy: Secondary | ICD-10-CM

## 2020-05-08 NOTE — Telephone Encounter (Signed)
Patient calls nurse line requesting refill with 90 day supply. Patient states that medication is working well and has no other complaints.   Talbot Grumbling, RN

## 2020-05-09 MED ORDER — METFORMIN HCL ER 500 MG PO TB24: 1000.0000 mg | ORAL_TABLET | Freq: Every day | ORAL | 3 refills | Status: DC

## 2020-06-19 ENCOUNTER — Other Ambulatory Visit: Payer: Self-pay | Admitting: Family Medicine

## 2020-06-19 DIAGNOSIS — E1142 Type 2 diabetes mellitus with diabetic polyneuropathy: Secondary | ICD-10-CM

## 2020-06-27 ENCOUNTER — Other Ambulatory Visit: Payer: Self-pay

## 2020-06-27 ENCOUNTER — Encounter: Payer: Self-pay | Admitting: Family Medicine

## 2020-06-27 ENCOUNTER — Ambulatory Visit (INDEPENDENT_AMBULATORY_CARE_PROVIDER_SITE_OTHER): Payer: Medicare HMO | Admitting: Family Medicine

## 2020-06-27 VITALS — BP 122/60 | HR 62 | Ht 62.0 in | Wt 151.8 lb

## 2020-06-27 DIAGNOSIS — I10 Essential (primary) hypertension: Secondary | ICD-10-CM

## 2020-06-27 DIAGNOSIS — E1142 Type 2 diabetes mellitus with diabetic polyneuropathy: Secondary | ICD-10-CM

## 2020-06-27 DIAGNOSIS — L84 Corns and callosities: Secondary | ICD-10-CM | POA: Insufficient documentation

## 2020-06-27 DIAGNOSIS — K7460 Unspecified cirrhosis of liver: Secondary | ICD-10-CM | POA: Diagnosis not present

## 2020-06-27 LAB — POCT GLYCOSYLATED HEMOGLOBIN (HGB A1C): Hemoglobin A1C: 6.5 % — AB (ref 4.0–5.6)

## 2020-06-27 MED ORDER — METFORMIN HCL ER 500 MG PO TB24: ORAL_TABLET | ORAL | 3 refills | Status: DC

## 2020-06-27 NOTE — Patient Instructions (Addendum)
You will not be due for another colonoscopy until 2027. Ask your insurance company the cost of Jardiance or if they have a preferred SGLT2 drug (Flozin family) Your A1C is good but not quite as good as before the fat holidays. I put in the podiatry referral. See me in three months if I am trimming your callus. You can see me in 6 months if the podiatrist takes care of your feet.

## 2020-06-28 ENCOUNTER — Encounter: Payer: Self-pay | Admitting: Family Medicine

## 2020-06-28 NOTE — Assessment & Plan Note (Signed)
At goal on metformin alone.  Januvia stopped.  She will check insurance about SGLT2

## 2020-06-28 NOTE — Progress Notes (Signed)
    SUBJECTIVE:   CHIEF COMPLAINT / HPI:   FU diabetes and more.   1. On metformin XR and tolerating well.  Diet suffered during holidays.  She intends to get back on a stricter diet.  A1C still at goal of less than 7.  We discussed adding a newer agent for secondary protection benefits.  Has a needle phobia so would not like a GLP1.  Would consider an SGLT2 if insurance will cover. On a statin. 2. BP at goal on current meds.  No CP, SOB or med side effects. 3. Right foot callus is back. 4. HPDP Up to date with COVID and flu and other interventions.  She has had an eye exam.  We need records.    OBJECTIVE:   BP 122/60   Pulse 62   Ht 5\' 2"  (1.575 m)   Wt 151 lb 12.8 oz (68.9 kg)   SpO2 97%   BMI 27.76 kg/m   Lungs clear Cardiac RRR without m or g Diabetic foot exam done. Debrided callus of right great toe  ASSESSMENT/PLAN:   Type 2 diabetes mellitus with peripheral neuropathy ([place]) At goal on metformin alone.  Januvia stopped.  She will check insurance about SGLT2  HYPERTENSION, BENIGN ESSENTIAL Good control on current meds  Plantar callus We discussed seeing podiatrist.  She will see once or twice and then decide whether to continue.  Referral entered.     Zenia Resides, MD [place]

## 2020-06-28 NOTE — Assessment & Plan Note (Signed)
Good control on current meds. 

## 2020-06-28 NOTE — Assessment & Plan Note (Signed)
We discussed seeing podiatrist.  She will see once or twice and then decide whether to continue.  Referral entered.

## 2020-08-22 ENCOUNTER — Ambulatory Visit (INDEPENDENT_AMBULATORY_CARE_PROVIDER_SITE_OTHER): Payer: Medicare HMO | Admitting: Podiatry

## 2020-08-22 ENCOUNTER — Other Ambulatory Visit: Payer: Self-pay

## 2020-08-22 VITALS — BP 127/69 | HR 69 | Temp 98.3°F

## 2020-08-22 DIAGNOSIS — R52 Pain, unspecified: Secondary | ICD-10-CM

## 2020-08-22 DIAGNOSIS — E1142 Type 2 diabetes mellitus with diabetic polyneuropathy: Secondary | ICD-10-CM | POA: Diagnosis not present

## 2020-08-22 DIAGNOSIS — L84 Corns and callosities: Secondary | ICD-10-CM

## 2020-08-25 NOTE — Progress Notes (Signed)
  Subjective:  Patient ID: Becky Fields, female    DOB: 1945-11-20,  MRN: [id number]  Chief Complaint  Patient presents with  . Callouses    Callous noted to the right posterior great toe. Patient states that she has had this problem for about 2 years now and it is coming back more frequently now. Patient works long hours and is standing most of the time. Would like recommendation for a good supportive shoe. Patient is also diabetic.     75 y.o. female presents with the above complaint. History confirmed with patient.    Objective:  Physical Exam: warm, good capillary refill, no trophic changes or ulcerative lesions, normal DP and PT pulses and normal sensory exam.   Right Foot: Plantar IPJ callus hallux  Assessment:  No diagnosis found.   Plan:  Patient was evaluated and treated and all questions answered.  Patient educated on diabetes. Discussed proper diabetic foot care and discussed risks and complications of disease. Educated patient in depth on reasons to return to the office immediately should he/she discover anything concerning or new on the feet. All questions answered. Discussed proper shoes as well.   All symptomatic hyperkeratoses were safely debrided with a sterile #15 blade to patient's level of comfort without incident. We discussed preventative and palliative care of these lesions including supportive and accommodative shoegear, padding, prefabricated and custom molded accommodative orthoses, use of a pumice stone and lotions/creams daily.   Return in about 10 weeks (around 10/31/2020).

## 2020-08-26 ENCOUNTER — Other Ambulatory Visit: Payer: Self-pay | Admitting: Family Medicine

## 2020-08-26 DIAGNOSIS — E1142 Type 2 diabetes mellitus with diabetic polyneuropathy: Secondary | ICD-10-CM

## 2020-08-26 DIAGNOSIS — I10 Essential (primary) hypertension: Secondary | ICD-10-CM

## 2020-09-10 ENCOUNTER — Telehealth: Payer: Self-pay | Admitting: *Deleted

## 2020-09-10 NOTE — Telephone Encounter (Signed)
Patient stated that she was seen yesterday and that the doctor had recommended (not mentioned in his notes)she may purchase at [facility] for athlete's feet. Please advise.

## 2020-09-11 NOTE — Telephone Encounter (Signed)
Most likely it's tinactin spray in the foot care aisle

## 2020-09-11 NOTE — Telephone Encounter (Signed)
Called and gave message to patient per Dr Sherryle Lis, verbalized understanding.

## 2020-09-13 ENCOUNTER — Other Ambulatory Visit: Payer: Self-pay | Admitting: Family Medicine

## 2020-09-13 DIAGNOSIS — E1142 Type 2 diabetes mellitus with diabetic polyneuropathy: Secondary | ICD-10-CM

## 2020-11-09 ENCOUNTER — Other Ambulatory Visit: Payer: Self-pay | Admitting: Family Medicine

## 2020-11-09 DIAGNOSIS — E781 Pure hyperglyceridemia: Secondary | ICD-10-CM

## 2020-11-21 ENCOUNTER — Other Ambulatory Visit: Payer: Self-pay

## 2020-11-21 ENCOUNTER — Ambulatory Visit (INDEPENDENT_AMBULATORY_CARE_PROVIDER_SITE_OTHER): Payer: Medicare HMO | Admitting: Podiatry

## 2020-11-21 DIAGNOSIS — M2141 Flat foot [pes planus] (acquired), right foot: Secondary | ICD-10-CM

## 2020-11-21 DIAGNOSIS — L84 Corns and callosities: Secondary | ICD-10-CM

## 2020-11-21 DIAGNOSIS — E1142 Type 2 diabetes mellitus with diabetic polyneuropathy: Secondary | ICD-10-CM | POA: Diagnosis not present

## 2020-11-21 DIAGNOSIS — M2142 Flat foot [pes planus] (acquired), left foot: Secondary | ICD-10-CM

## 2020-11-23 IMAGING — US US ABDOMEN LIMITED
1 series · 14 of 25 positions shown · non-contrast
Comparison: 01/11/2019

CLINICAL DATA: Cirrhosis, hepatitis C

EXAM:
ULTRASOUND ABDOMEN LIMITED RIGHT UPPER QUADRANT

[Series 1: us abdomen limited · 0.23mm/px · 14 of 35 slices shown]
[im 1/35]
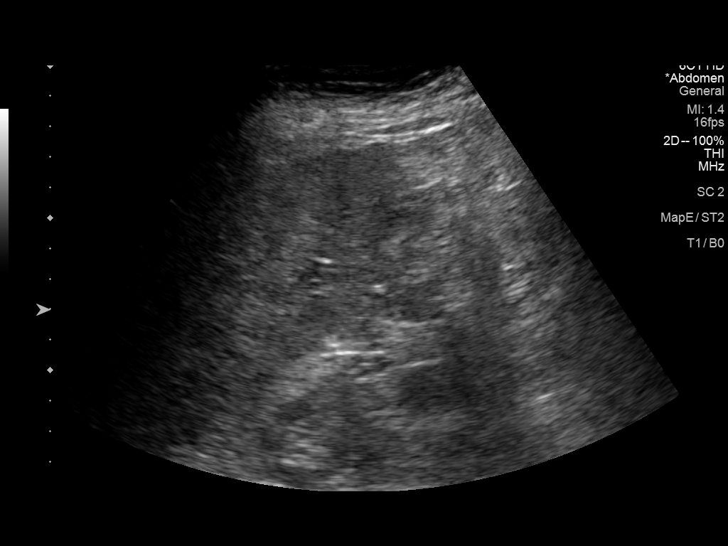
[im 3/35]
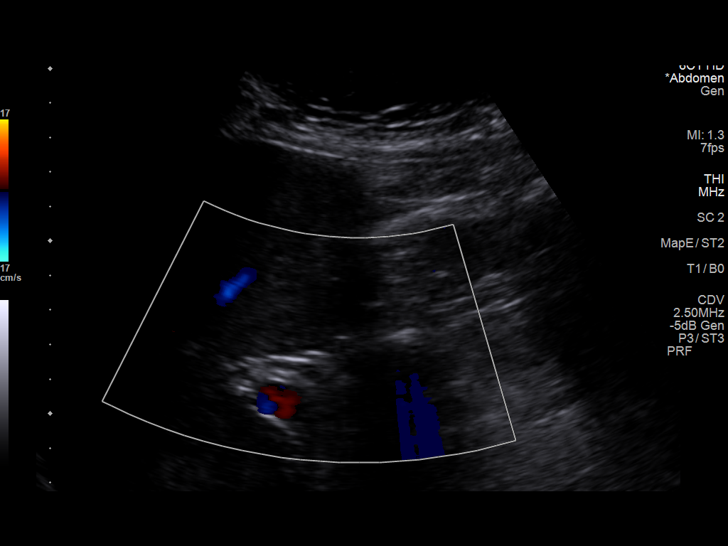
[im 6/35]
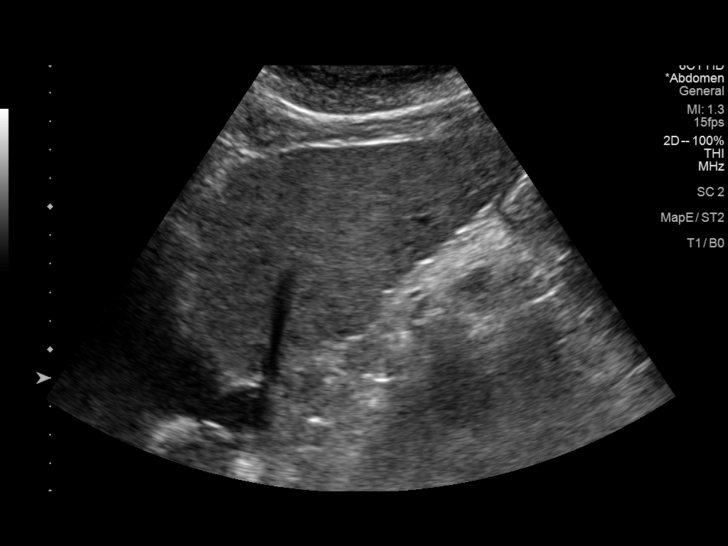
[im 9/35]
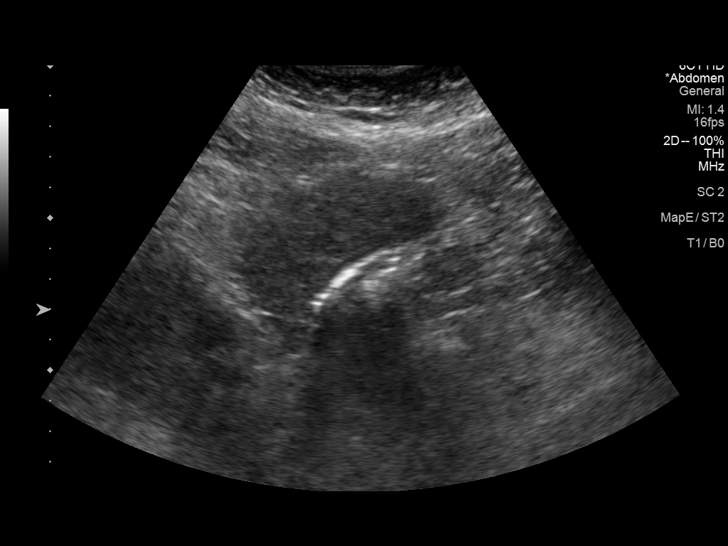
[im 12/35]
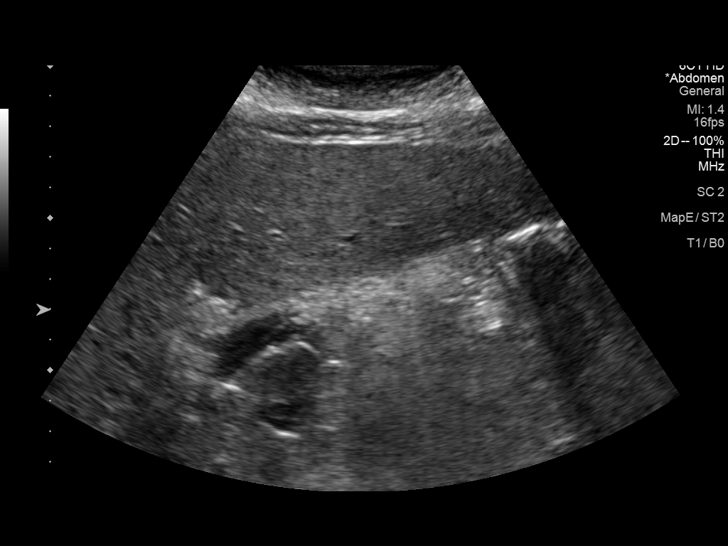
[im 13/35]
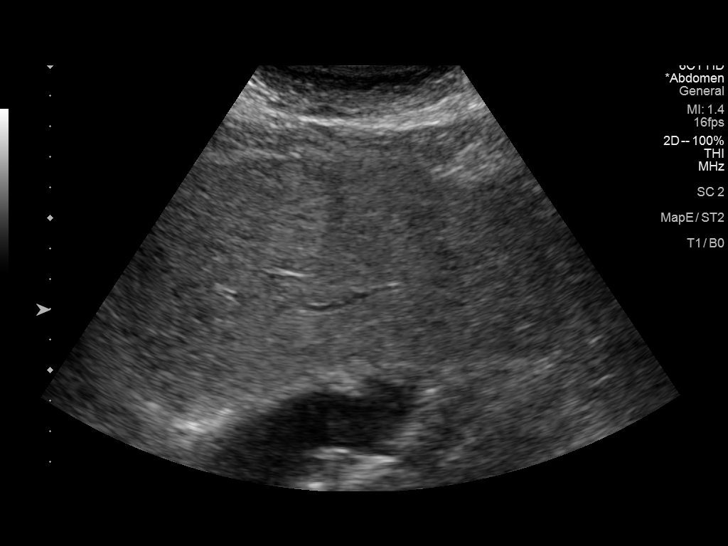
[im 16/35]
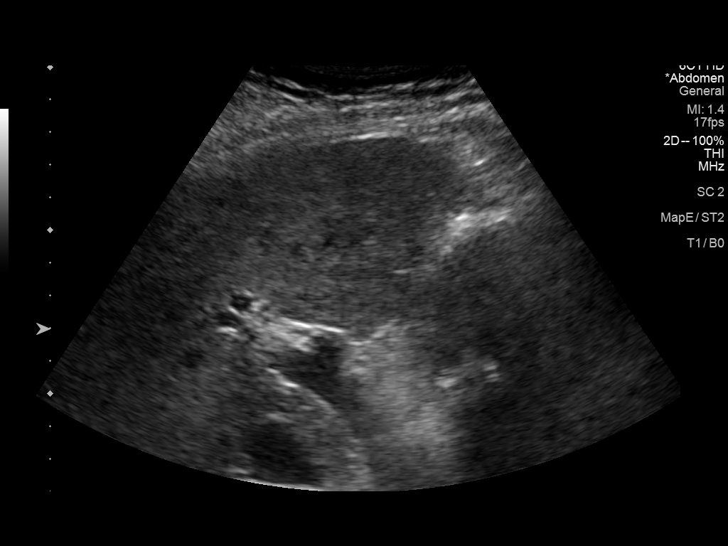
[im 19/35]
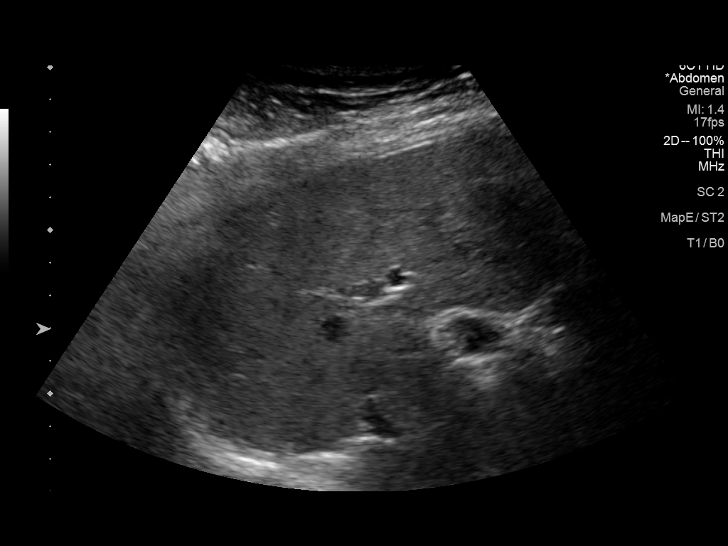
[im 22/35]
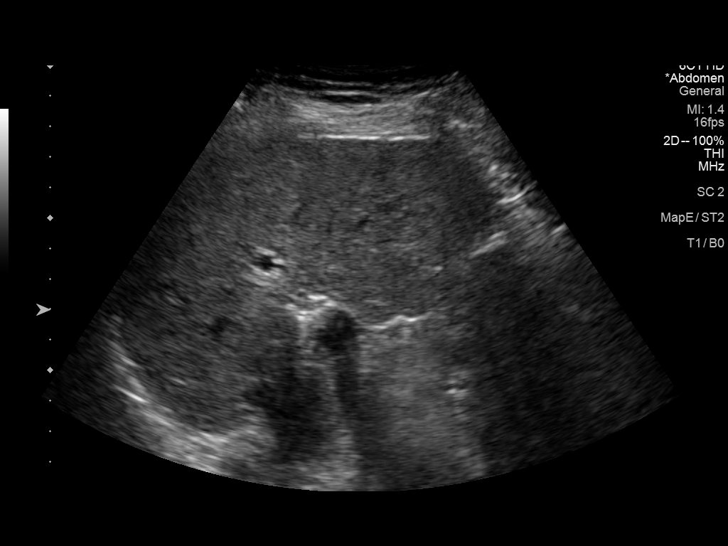
[im 23/35]
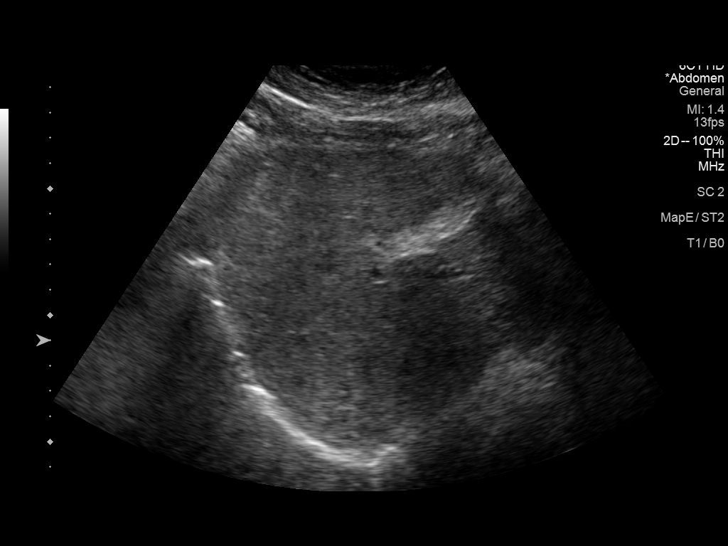
[im 26/35]
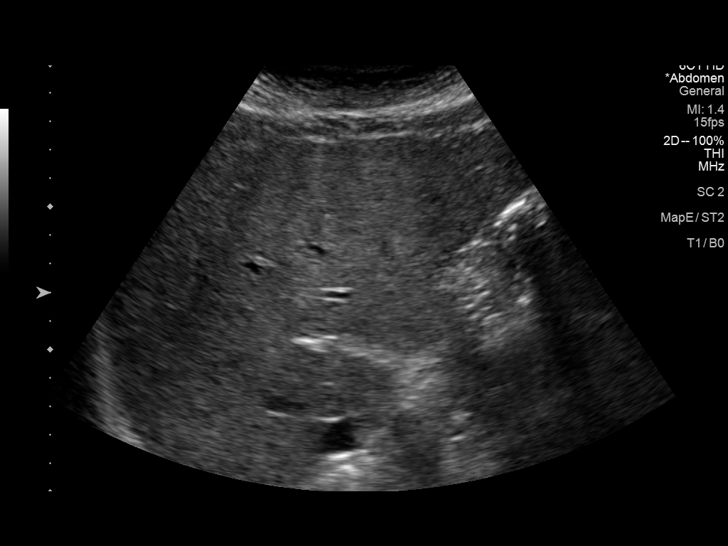
[im 29/35]
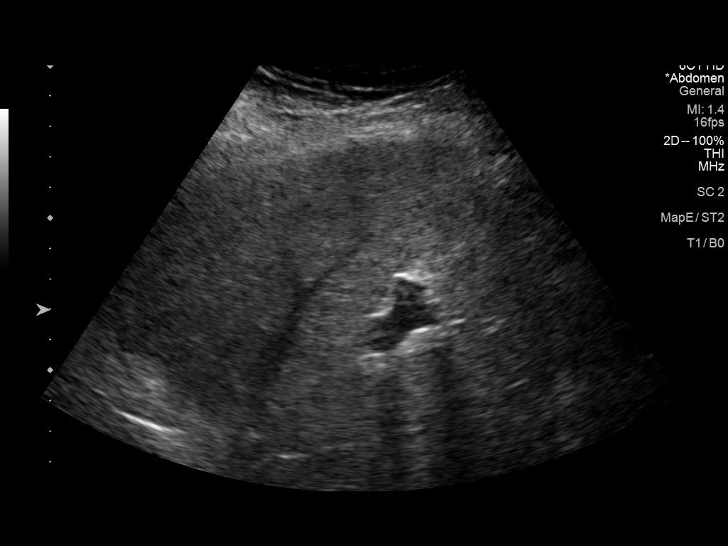
[im 32/35]
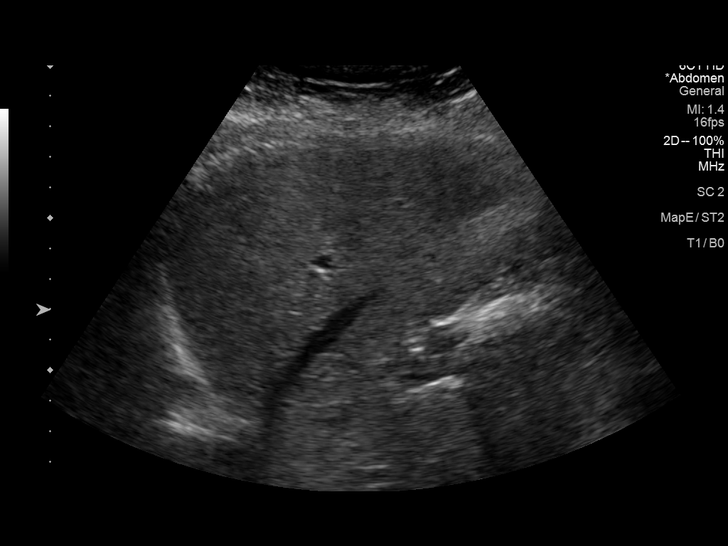
[im 35/35]
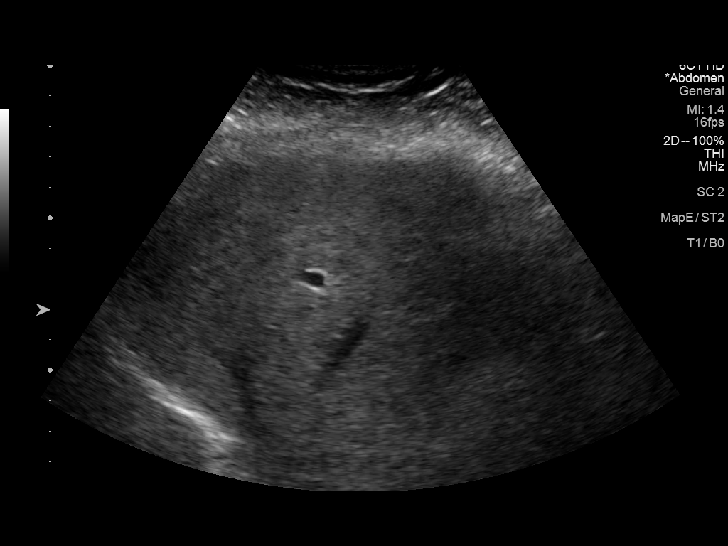

[14 of 25 positions shown; findings below may reference images not displayed]

FINDINGS: Gallbladder:

Surgically absent.

Common bile duct:

Diameter: 4 mm.

Liver:

No focal lesion identified. Slightly nodular hepatic surface contour
with coarsened, heterogeneous echotexture. Portal vein is patent on
color Doppler imaging with normal direction of blood flow towards
the liver.

Other: None.
IMPRESSION: Cirrhotic hepatic morphology.  No focal liver lesion identified.

## 2020-11-25 ENCOUNTER — Encounter: Payer: Self-pay | Admitting: Podiatry

## 2020-11-25 ENCOUNTER — Other Ambulatory Visit: Payer: Self-pay

## 2020-11-25 ENCOUNTER — Ambulatory Visit (INDEPENDENT_AMBULATORY_CARE_PROVIDER_SITE_OTHER): Payer: Medicare HMO | Admitting: Podiatry

## 2020-11-25 ENCOUNTER — Other Ambulatory Visit: Payer: Medicare HMO

## 2020-11-25 DIAGNOSIS — E1142 Type 2 diabetes mellitus with diabetic polyneuropathy: Secondary | ICD-10-CM

## 2020-11-25 DIAGNOSIS — L84 Corns and callosities: Secondary | ICD-10-CM

## 2020-11-25 NOTE — Progress Notes (Addendum)
  Subjective:  Patient ID: Becky Fields, female    DOB: 1946-05-07,  MRN: [id number]  Chief Complaint  Patient presents with   Callouses    Painful callus lesion, 3 month follow up    75 y.o. female presents with the above complaint. History confirmed with patient.    Objective:  Physical Exam: warm, good capillary refill, no trophic changes or ulcerative lesions, normal DP and PT pulses and normal sensory exam.   Right Foot: Plantar IPJ callus hallux  Assessment:   1. Callus of foot   2. Type 2 diabetes mellitus with peripheral neuropathy (HCC)   3. Pes planus of both feet      Plan:  Patient was evaluated and treated and all questions answered.  Patient educated on diabetes. Discussed proper diabetic foot care and discussed risks and complications of disease. Educated patient in depth on reasons to return to the office immediately should he/she discover anything concerning or new on the feet. All questions answered. Discussed proper shoes as well.   All symptomatic hyperkeratoses were safely debrided with a sterile #15 blade to patient's level of comfort without incident. We discussed preventative and palliative care of these lesions including supportive and accommodative shoegear, padding, prefabricated and custom molded accommodative orthoses, use of a pumice stone and lotions/creams daily.   Return in about 2 months (around 01/21/2021) for check pre ulcerative calluses.

## 2020-11-25 NOTE — Progress Notes (Signed)
Patient presented for foam casting for 3 pair custom diabetic shoe inserts. Patient is measured with a brannock device to be a size 8 wide  Diabetic shoes are chosen from the [employer] catalog.  The shoes chosen are 981  The patient will be contacted when the shoes and inserts are ready to be picked up

## 2020-12-05 ENCOUNTER — Other Ambulatory Visit: Payer: Self-pay

## 2020-12-05 ENCOUNTER — Encounter: Payer: Self-pay | Admitting: Family Medicine

## 2020-12-05 ENCOUNTER — Ambulatory Visit (INDEPENDENT_AMBULATORY_CARE_PROVIDER_SITE_OTHER): Payer: Medicare HMO | Admitting: Family Medicine

## 2020-12-05 ENCOUNTER — Ambulatory Visit (INDEPENDENT_AMBULATORY_CARE_PROVIDER_SITE_OTHER): Payer: Medicare HMO

## 2020-12-05 VITALS — BP 120/62 | HR 67 | Ht 62.0 in | Wt 150.0 lb

## 2020-12-05 DIAGNOSIS — H6122 Impacted cerumen, left ear: Secondary | ICD-10-CM | POA: Diagnosis not present

## 2020-12-05 DIAGNOSIS — E1142 Type 2 diabetes mellitus with diabetic polyneuropathy: Secondary | ICD-10-CM

## 2020-12-05 DIAGNOSIS — Z23 Encounter for immunization: Secondary | ICD-10-CM

## 2020-12-05 DIAGNOSIS — G8929 Other chronic pain: Secondary | ICD-10-CM | POA: Diagnosis not present

## 2020-12-05 DIAGNOSIS — M545 Low back pain, unspecified: Secondary | ICD-10-CM | POA: Diagnosis not present

## 2020-12-05 DIAGNOSIS — B182 Chronic viral hepatitis C: Secondary | ICD-10-CM

## 2020-12-05 DIAGNOSIS — M5440 Lumbago with sciatica, unspecified side: Secondary | ICD-10-CM | POA: Insufficient documentation

## 2020-12-05 DIAGNOSIS — E781 Pure hyperglyceridemia: Secondary | ICD-10-CM | POA: Diagnosis not present

## 2020-12-05 DIAGNOSIS — E2839 Other primary ovarian failure: Secondary | ICD-10-CM | POA: Insufficient documentation

## 2020-12-05 DIAGNOSIS — I10 Essential (primary) hypertension: Secondary | ICD-10-CM | POA: Diagnosis not present

## 2020-12-05 LAB — POCT GLYCOSYLATED HEMOGLOBIN (HGB A1C): HbA1c, POC (controlled diabetic range): 6.2 % (ref 0.0–7.0)

## 2020-12-05 NOTE — Patient Instructions (Signed)
Get the shingles vaccine when you can afford it. I put in future orders for blood work here.  Come in fasting.  Do anytime after 01/31/21 I put in a future order for your annual liver ultrasound.  It is due any time after 03/25/21 I put in orders for regular low back xrays - that does not need to be scheduled. I also put in orders for your bone density.  That will need scheduling. I hope fixed your ear problem

## 2020-12-09 ENCOUNTER — Encounter: Payer: Self-pay | Admitting: Family Medicine

## 2020-12-09 NOTE — Progress Notes (Signed)
    SUBJECTIVE:   CHIEF COMPLAINT / HPI:   Diabetes and more: DM.  Once required meds.  She has lost considerable wt and is now diet controled.  A1C remains nicely at goal.  6.2 today. Diabetic foot callous and previous ulceration with prolonged healing.  Now being followed by podiatry.   Hx of previous hep C.  Treated.  Due for annual screening in Oct. Acute injury to low back.  Minimal trauma.  No radiculopathy.  No recent bone density. No bowel or bladder problems. Left ear ringing.  Hx of previous cerumen impaction.     OBJECTIVE:   BP 120/62   Pulse 67   Ht 5\' 2"  (1.575 m)   Wt 150 lb (68 kg)   SpO2 97%   BMI 27.44 kg/m   Rt canal and TM normal.  Left cerumen impaction irrigated clear.  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  Gait is antalgic Normal LE strength and sensation. ASSESSMENT/PLAN:   Chronic hepatitis C without hepatic coma ([place]) Annual [place] screening in Oct  Hearing loss secondary to cerumen impaction, left Irrigated clear  Type 2 diabetes mellitus with peripheral neuropathy ([place]) Good control with diet alone.    Low back pain Acute injury.  Likely has underlying DDD or facet arthropathy.  R/O acute compression fracture.  Regular xrays and bone density rescreen.     Zenia Resides, MD [place]

## 2020-12-09 NOTE — Assessment & Plan Note (Signed)
Good control with diet alone.

## 2020-12-09 NOTE — Assessment & Plan Note (Signed)
Acute injury.  Likely has underlying DDD or facet arthropathy.  R/O acute compression fracture.  Regular xrays and bone density rescreen.

## 2020-12-09 NOTE — Assessment & Plan Note (Signed)
Irrigated clear 

## 2020-12-09 NOTE — Assessment & Plan Note (Signed)
Annual [place] screening in Oct

## 2020-12-18 ENCOUNTER — Other Ambulatory Visit: Payer: Medicare HMO

## 2020-12-18 ENCOUNTER — Encounter (HOSPITAL_COMMUNITY): Payer: Self-pay

## 2020-12-18 ENCOUNTER — Other Ambulatory Visit: Payer: Self-pay

## 2020-12-18 ENCOUNTER — Ambulatory Visit (INDEPENDENT_AMBULATORY_CARE_PROVIDER_SITE_OTHER): Payer: Medicare HMO | Admitting: Family Medicine

## 2020-12-18 ENCOUNTER — Emergency Department (HOSPITAL_COMMUNITY): Payer: Medicare HMO

## 2020-12-18 ENCOUNTER — Emergency Department (HOSPITAL_COMMUNITY)
Admission: EM | Admit: 2020-12-18 | Discharge: 2020-12-18 | Disposition: A | Discharge status: Home / Self Care | Payer: Medicare HMO | Attending: Emergency Medicine | Admitting: Emergency Medicine

## 2020-12-18 ENCOUNTER — Encounter: Payer: Self-pay | Admitting: Family Medicine

## 2020-12-18 VITALS — BP 125/87 | HR 104 | Ht 62.0 in | Wt 153.4 lb

## 2020-12-18 DIAGNOSIS — R339 Retention of urine, unspecified: Secondary | ICD-10-CM | POA: Diagnosis not present

## 2020-12-18 DIAGNOSIS — M549 Dorsalgia, unspecified: Secondary | ICD-10-CM

## 2020-12-18 DIAGNOSIS — N320 Bladder-neck obstruction: Secondary | ICD-10-CM | POA: Diagnosis not present

## 2020-12-18 DIAGNOSIS — I10 Essential (primary) hypertension: Secondary | ICD-10-CM | POA: Diagnosis not present

## 2020-12-18 DIAGNOSIS — R1900 Intra-abdominal and pelvic swelling, mass and lump, unspecified site: Secondary | ICD-10-CM

## 2020-12-18 DIAGNOSIS — Z79899 Other long term (current) drug therapy: Secondary | ICD-10-CM | POA: Diagnosis not present

## 2020-12-18 DIAGNOSIS — R1909 Other intra-abdominal and pelvic swelling, mass and lump: Secondary | ICD-10-CM | POA: Diagnosis not present

## 2020-12-18 DIAGNOSIS — M4800 Spinal stenosis, site unspecified: Secondary | ICD-10-CM

## 2020-12-18 DIAGNOSIS — K5730 Diverticulosis of large intestine without perforation or abscess without bleeding: Secondary | ICD-10-CM | POA: Diagnosis not present

## 2020-12-18 DIAGNOSIS — M545 Low back pain, unspecified: Secondary | ICD-10-CM | POA: Diagnosis not present

## 2020-12-18 DIAGNOSIS — Z7984 Long term (current) use of oral hypoglycemic drugs: Secondary | ICD-10-CM | POA: Insufficient documentation

## 2020-12-18 DIAGNOSIS — E1140 Type 2 diabetes mellitus with diabetic neuropathy, unspecified: Secondary | ICD-10-CM | POA: Insufficient documentation

## 2020-12-18 DIAGNOSIS — K449 Diaphragmatic hernia without obstruction or gangrene: Secondary | ICD-10-CM | POA: Diagnosis not present

## 2020-12-18 HISTORY — DX: Retention of urine, unspecified: R33.9

## 2020-12-18 LAB — CBC WITH DIFFERENTIAL/PLATELET
Abs Immature Granulocytes: 0.05 10*3/uL (ref 0.00–0.07)
Basophils Absolute: 0 10*3/uL (ref 0.0–0.1)
Basophils Relative: 0 %
Eosinophils Absolute: 0.1 10*3/uL (ref 0.0–0.5)
Eosinophils Relative: 1 %
HCT: 43.1 % (ref 36.0–46.0)
Hemoglobin: 14.3 g/dL (ref 12.0–15.0)
Immature Granulocytes: 0 %
Lymphocytes Relative: 12 %
Lymphs Abs: 1.3 10*3/uL (ref 0.7–4.0)
MCH: 30.6 pg (ref 26.0–34.0)
MCHC: 33.2 g/dL (ref 30.0–36.0)
MCV: 92.3 fL (ref 80.0–100.0)
Monocytes Absolute: 1.3 10*3/uL — ABNORMAL HIGH (ref 0.1–1.0)
Monocytes Relative: 12 %
Neutro Abs: 8.6 10*3/uL — ABNORMAL HIGH (ref 1.7–7.7)
Neutrophils Relative %: 75 %
Platelets: 219 10*3/uL (ref 150–400)
RBC: 4.67 MIL/uL (ref 3.87–5.11)
RDW: 12 % (ref 11.5–15.5)
WBC: 11.4 10*3/uL — ABNORMAL HIGH (ref 4.0–10.5)
nRBC: 0 % (ref 0.0–0.2)

## 2020-12-18 LAB — COMPREHENSIVE METABOLIC PANEL
ALT: 19 U/L (ref 0–44)
AST: 21 U/L (ref 15–41)
Albumin: 4.3 g/dL (ref 3.5–5.0)
Alkaline Phosphatase: 62 U/L (ref 38–126)
Anion gap: 10 (ref 5–15)
BUN: 15 mg/dL (ref 8–23)
CO2: 23 mmol/L (ref 22–32)
Calcium: 9.4 mg/dL (ref 8.9–10.3)
Chloride: 104 mmol/L (ref 98–111)
Creatinine, Ser: 0.81 mg/dL (ref 0.44–1.00)
GFR, Estimated: >60 mL/min (ref >60)
Glucose, Bld: 154 mg/dL — ABNORMAL HIGH (ref 70–99)
Potassium: 3.3 mmol/L — ABNORMAL LOW (ref 3.5–5.1)
Sodium: 137 mmol/L (ref 135–145)
Total Bilirubin: 1.3 mg/dL — ABNORMAL HIGH (ref 0.3–1.2)
Total Protein: 7.4 g/dL (ref 6.5–8.1)

## 2020-12-18 LAB — POCT URINALYSIS DIP (MANUAL ENTRY)
Bilirubin, UA: NEGATIVE
Glucose, UA: NEGATIVE mg/dL
Ketones, POC UA: NEGATIVE mg/dL
Nitrite, UA: NEGATIVE
Protein Ur, POC: 30 mg/dL — AB
Spec Grav, UA: 1.015 (ref 1.010–1.025)
Urobilinogen, UA: 0.2 E.U./dL
pH, UA: 5 (ref 5.0–8.0)

## 2020-12-18 LAB — URINALYSIS, ROUTINE W REFLEX MICROSCOPIC
Bilirubin Urine: NEGATIVE
Glucose, UA: NEGATIVE mg/dL
Ketones, ur: NEGATIVE mg/dL
Leukocytes,Ua: NEGATIVE
Nitrite: POSITIVE — AB
Protein, ur: NEGATIVE mg/dL
Specific Gravity, Urine: 1.01 (ref 1.005–1.030)
pH: 5 (ref 5.0–8.0)

## 2020-12-18 LAB — CBG MONITORING, ED: Glucose-Capillary: 120 mg/dL — ABNORMAL HIGH (ref 70–99)

## 2020-12-18 MED ORDER — IOHEXOL 300 MG/ML  SOLN
100.0000 mL | Freq: Once | INTRAMUSCULAR | Status: AC | PRN
  Administered 2020-12-18: 100 mL via INTRAVENOUS

## 2020-12-18 NOTE — ED Provider Notes (Signed)
I personally evaluated the patient during the encounter and completed a history, physical, procedures, medical decision making to contribute to the overall care of the patient and decision making for the patient briefly, the patient is a 75 y.o. female here with urinary retention.  States that she has not been able to empty her bladder since last night.  Denies any history of the same.  No history of stroke or neurogenic bladder.  She has had some low back pain for about a month.  No recent specific back trauma.  She is not having any loss of bowels, no numbness or weakness in her lower extremities.  She is actually walking around the room.  History and physical is not really consistent with cauda equina but will pursue CT scan abdomen pelvis and lumbar CT to look for obstructive process.  Could be a urine infection causing a bladder spasm.  Lab work thus far is unremarkable.  No significant anemia, electrolyte abnormality, kidney injury.  We will get CT imaging.  We will place a Foley catheter as she had over 600 cc of urine in her bladder.  Will reevaluate after imaging and labs and consider any further work-up.  Please see my PAs note for further results, evaluation, disposition of the patient.  I will continue to follow along medical decision making.  This chart was dictated using voice recognition software.  Despite best efforts to proofread,  errors can occur which can change the documentation meaning.    EKG Interpretation None             Lennice Sites, DO 12/18/20 1614

## 2020-12-18 NOTE — ED Notes (Signed)
Patient transported to CT 

## 2020-12-18 NOTE — Consult Note (Signed)
Urology Consult  Referring physician: A Curatolo Reason for referral: bladder mass  Chief Complaint: bladder mass  History of Present Illness: retention with bladder mass and foley possibly side of the bladder.  Patient said for 12 hours or longer she was not voiding.  She came in with abdominal discomfort and low back pain.  Foley catheter was inserted with several 100 cc of clear urine.  She had immediate relief.  Normally voids every hour and gets up a few times at night.  No bladder surgery.  No infection history.  Flow normally good.  Has not had a hysterectomy  Past Medical History:  Diagnosis Date   Cataract    Diabetes mellitus    Ejection fraction    .   Hepatitis C    Hypertension    Hypertriglyceridemia    Takotsubo syndrome    October 16, 2012   Past Surgical History:  Procedure Laterality Date   CHOLECYSTECTOMY     COLONOSCOPY     EYE SURGERY  02/2012   bilateral cataract surg   LEFT HEART CATHETERIZATION WITH CORONARY ANGIOGRAM N/A 10/17/2012   Procedure: LEFT HEART CATHETERIZATION WITH CORONARY ANGIOGRAM;  Surgeon: Sherren Mocha, MD;  Location: [facility] CATH LAB;  Service: Cardiovascular;  Laterality: N/A;    Medications: I have reviewed the patient's current medications. Allergies:  Allergies  Allergen Reactions   Lisinopril     REACTION: causes cough   Sulfa Antibiotics     Told as a child allergic    Family History  Problem Relation Age of Onset   Aneurysm Father    Hypertension Mother    CAD Brother 55   Hypertension Sister    Heart attack Neg Hx    Stroke Neg Hx    Colon cancer Neg Hx    Social History:  reports that she has never smoked. She has never used smokeless tobacco. She reports current alcohol use of about 6.0 standard drinks of alcohol per week. She reports that she does not use drugs.  ROS: All systems are reviewed and negative except as noted. Rest negative  Physical Exam:  Vital signs in last 24 hours: Temp:  [98.2 F (36.8 C)]  98.2 F (36.8 C) (06/29 1256) Pulse Rate:  [72-104] 88 (06/29 2000) Resp:  [9-18] 11 (06/29 2000) BP: (114-146)/(55-87) 146/73 (06/29 2000) SpO2:  [91 %-100 %] 96 % (06/29 2000) Weight:  [69.6 kg] 69.6 kg (06/29 1058)  Cardiovascular: Skin warm; not flushed Respiratory: Breaths quiet; no shortness of breath Abdomen: No masses Neurological: Normal sensation to touch Musculoskeletal: Normal motor function arms and legs Lymphatics: No inguinal adenopathy Skin: No rashes Genitourinary: Foley in the bladder.  Draining 500 to 600 mL of clear urine  Laboratory Data:  Results for orders placed or performed during the hospital encounter of 12/18/20 (from the past 72 hour(s))  CBG monitoring, ED     Status: Abnormal   Collection Time: 12/18/20  2:15 PM  Result Value Ref Range   Glucose-Capillary 120 (H) 70 - 99 mg/dL    Comment: Glucose reference range applies only to samples taken after fasting for at least 8 hours.  CBC with Differential     Status: Abnormal   Collection Time: 12/18/20  2:30 PM  Result Value Ref Range   WBC 11.4 (H) 4.0 - 10.5 K/uL   RBC 4.67 3.87 - 5.11 MIL/uL   Hemoglobin 14.3 12.0 - 15.0 g/dL   HCT 43.1 36.0 - 46.0 %   MCV 92.3 80.0 -  100.0 fL   MCH 30.6 26.0 - 34.0 pg   MCHC 33.2 30.0 - 36.0 g/dL   RDW 12.0 11.5 - 15.5 %   Platelets 219 150 - 400 K/uL   nRBC 0.0 0.0 - 0.2 %   Neutrophils Relative % 75 %   Neutro Abs 8.6 (H) 1.7 - 7.7 K/uL   Lymphocytes Relative 12 %   Lymphs Abs 1.3 0.7 - 4.0 K/uL   Monocytes Relative 12 %   Monocytes Absolute 1.3 (H) 0.1 - 1.0 K/uL   Eosinophils Relative 1 %   Eosinophils Absolute 0.1 0.0 - 0.5 K/uL   Basophils Relative 0 %   Basophils Absolute 0.0 0.0 - 0.1 K/uL   Immature Granulocytes 0 %   Abs Immature Granulocytes 0.05 0.00 - 0.07 K/uL    Comment: Performed at [facility] [address], [place], [place] [address]  Comprehensive metabolic panel     Status: Abnormal   Collection Time: 12/18/20  2:30 PM   Result Value Ref Range   Sodium 137 135 - 145 mmol/L   Potassium 3.3 (L) 3.5 - 5.1 mmol/L   Chloride 104 98 - 111 mmol/L   CO2 23 22 - 32 mmol/L   Glucose, Bld 154 (H) 70 - 99 mg/dL    Comment: Glucose reference range applies only to samples taken after fasting for at least 8 hours.   BUN 15 8 - 23 mg/dL   Creatinine, Ser 0.81 0.44 - 1.00 mg/dL   Calcium 9.4 8.9 - 10.3 mg/dL   Total Protein 7.4 6.5 - 8.1 g/dL   Albumin 4.3 3.5 - 5.0 g/dL   AST 21 15 - 41 U/L   ALT 19 0 - 44 U/L   Alkaline Phosphatase 62 38 - 126 U/L   Total Bilirubin 1.3 (H) 0.3 - 1.2 mg/dL   GFR, Estimated >60 >60 mL/min    Comment: (NOTE) Calculated using the CKD-EPI Creatinine Equation (2021)    Anion gap 10 5 - 15    Comment: Performed at [facility] [address], [place], [place] [address]  Urinalysis, Routine w reflex microscopic Urine, Catheterized     Status: Abnormal   Collection Time: 12/18/20  4:37 PM  Result Value Ref Range   Color, Urine AMBER (A) YELLOW    Comment: BIOCHEMICALS MAY BE AFFECTED BY COLOR   APPearance CLEAR CLEAR   Specific Gravity, Urine 1.010 1.005 - 1.030   pH 5.0 5.0 - 8.0   Glucose, UA NEGATIVE NEGATIVE mg/dL   Hgb urine dipstick SMALL (A) NEGATIVE   Bilirubin Urine NEGATIVE NEGATIVE   Ketones, ur NEGATIVE NEGATIVE mg/dL   Protein, ur NEGATIVE NEGATIVE mg/dL   Nitrite POSITIVE (A) NEGATIVE   Leukocytes,Ua NEGATIVE NEGATIVE   RBC / HPF 0-5 0 - 5 RBC/hpf   WBC, UA 0-5 0 - 5 WBC/hpf   Bacteria, UA RARE (A) NONE SEEN   Squamous Epithelial / LPF 0-5 0 - 5   Hyaline Casts, UA PRESENT     Comment: Performed at [facility], [address] [place], [place] [address]   No results found for this or any previous visit (from the past 240 hour(s)). Creatinine: Recent Labs    12/18/20 1430  CREATININE 0.81    Xrays: See report/chart I reviewed the x-ray with the radiologist and they will change the report.  It appears that the patient has a large cystic mass  in the pelvis compressing the bladder and the Foley  catheter is in a compressed bladder pushed anteriorly.  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 Ovarian or uterine mass/carcinoma  Jasai Sorg A Sierah Lacewell 12/18/2020, 8:19 PM

## 2020-12-18 NOTE — ED Notes (Signed)
Bladder scanner in triage [phone]

## 2020-12-18 NOTE — Assessment & Plan Note (Signed)
75 year old with abdominal discomfort for 1 day.  She does not have any history of urinary retention but states that she felt like she needed to urinate last night and was unable to go.  She has tried multiple times to go to the bathroom but at most only has a small trickle.  Ultrasound was performed in office today which showed a grossly enlarged bladder.  The patient was able to give a small amount of urine for urinalysis which was not suggestive of any infection.  She has not tried any new medications or over-the-counter medications.  She has not used any anticholinergics that could explain her symptoms.  She did state that she was planning to be worked up by her PCP for low back pain which may be related.  The back pain is been going on for several months and is not waking her up from sleep.  Did discuss with the patient that I recommend she go to the emergency department as we do not have Foley catheter supplies here in the office.  I did reach out to the charge nurse to let him know that she would be coming over for Foley catheter in for further work-up for her urinary retention.

## 2020-12-18 NOTE — ED Provider Notes (Signed)
[facility] EMERGENCY DEPARTMENT Provider Note   CSN: [id number] Arrival date & time: 12/18/20  1234     History Chief Complaint  Patient presents with   Urinary Retention    Becky Fields is a 75 y.o. female.  Patient with history of diabetes, hypertension, high cholesterol presents to the emergency department today for evaluation of urinary retention.  Patient began having urinary symptoms last night.  She thought that she could be getting a urinary tract infection.  She noted increased urinary frequency but only producing small amounts of urine.  She went to her primary care today who found significant urinary retention and patient was referred to the emergency department.  Patient denies similar symptoms in the past.  She denies antihistamine medications.  She has been having normal bowel movements.  Of note, patient has been having back pain over the past month or so.  She has a lower back x-ray and DEXA scan ordered, to be done next week.  She states that the pain is worse with movement, standing up and sitting down. Patient denies other warning symptoms of back pain including: fecal incontinence, night sweats, waking from sleep with back pain, unexplained fevers or weight loss, h/o cancer, recent trauma.  Her back pain started several weeks after a fall, during which she fell forward.  She denies significant weight changes.  No history of back problems.  She denies saddle anesthesias but does have pain in her rectal area when she tries to have a bowel movement.       Past Medical History:  Diagnosis Date   Cataract    Diabetes mellitus    Ejection fraction    .   Hepatitis C    Hypertension    Hypertriglyceridemia    Takotsubo syndrome    October 16, 2012    Patient Active Problem List   Diagnosis Date Noted   Urinary retention 12/18/2020   Low back pain 12/05/2020   Estrogen deficiency 12/05/2020   Plantar callus 06/27/2020   Preventative health care  12/22/2018   Trigger finger, acquired 11/18/2016   Hearing loss secondary to cerumen impaction, left 11/18/2016   Hepatic cirrhosis ([place]) 05/23/2014   Mitral regurgitation 10/18/2012   Takotsubo syndrome 10/18/2012   HYPERTRIGLYCERIDEMIA 01/25/2009   INSOMNIA, PERSISTENT 01/25/2009   Type 2 diabetes mellitus with peripheral neuropathy ([place]) 04/13/2008   OBESITY 02/21/2008   History of colonic polyps 05/02/2007   Chronic hepatitis C without hepatic coma ([place]) 03/07/2007   HYPERTENSION, BENIGN ESSENTIAL 01/28/2007    Past Surgical History:  Procedure Laterality Date   CHOLECYSTECTOMY     COLONOSCOPY     EYE SURGERY  02/2012   bilateral cataract surg   LEFT HEART CATHETERIZATION WITH CORONARY ANGIOGRAM N/A 10/17/2012   Procedure: LEFT HEART CATHETERIZATION WITH CORONARY ANGIOGRAM;  Surgeon: Sherren Mocha, MD;  Location: [facility] CATH LAB;  Service: Cardiovascular;  Laterality: N/A;     OB History   No obstetric history on file.     Family History  Problem Relation Age of Onset   Aneurysm Father    Hypertension Mother    CAD Brother 30   Hypertension Sister    Heart attack Neg Hx    Stroke Neg Hx    Colon cancer Neg Hx     Social History   Tobacco Use   Smoking status: Never   Smokeless tobacco: Never  Substance Use Topics   Alcohol use: Yes    Alcohol/week: 6.0 standard drinks  Types: 6 Standard drinks or equivalent per week    Comment: Occasional glass of wine on the weekends   Drug use: No    Home Medications Prior to Admission medications   Medication Sig Start Date End Date Taking? Authorizing Provider  losartan (COZAAR) 100 MG tablet Take 1 tablet by mouth once daily 08/26/20   Zenia Resides, MD  metoprolol succinate (TOPROL-XL) 50 MG 24 hr tablet TAKE 1 TABLET BY MOUTH AT BEDTIME WITH A MEAL OR  IMMEDIATELY  FOLLOWING  A  MEAL 11/11/20   Hensel, Jamal Collin, MD  albuterol (VENTOLIN HFA) 108 (90 Base) MCG/ACT inhaler Inhale 2 puffs into the lungs every 4 (four)  hours as needed for wheezing or shortness of breath. 04/13/19   Shirley, [place], DO  fluticasone (FLONASE) 50 MCG/ACT nasal spray Place 2 sprays into both nostrils daily. Patient taking differently: Place 2 sprays into both nostrils 2 (two) times daily as needed for allergies.  11/06/13   Zenia Resides, MD  loratadine (CLARITIN) 10 MG tablet TAKE 1 TABLET BY MOUTH ONCE DAILY AS NEEDED FOR ALLERGIES 01/06/19   Zenia Resides, MD  metFORMIN (GLUCOPHAGE-XR) 500 MG 24 hr tablet TAKE 2 TABLETS BY MOUTH ONCE DAILY WITH BREAKFAST 06/27/20   Zenia Resides, MD  Multiple Vitamin (MULTIVITAMIN WITH MINERALS) TABS Take 1 tablet by mouth daily. Reported on 12/17/2015    [provider]  Polyethyl Glycol-Propyl Glycol (SYSTANE OP) Apply 1 drop to eye 2 (two) times daily as needed (dry eyes). Reported on 09/05/2015    [provider]  pravastatin (PRAVACHOL) 40 MG tablet Take 1 tablet by mouth once daily 09/13/20   Zenia Resides, MD    Allergies    Lisinopril and Sulfa antibiotics  Review of Systems   Review of Systems  Constitutional:  Negative for fever and unexpected weight change.  HENT:  Negative for rhinorrhea and sore throat.   Eyes:  Negative for redness.  Respiratory:  Negative for cough.   Cardiovascular:  Negative for chest pain.  Gastrointestinal:  Positive for rectal pain. Negative for abdominal pain, diarrhea, nausea and vomiting.  Genitourinary:  Positive for difficulty urinating and frequency. Negative for dysuria, flank pain, hematuria and urgency.  Musculoskeletal:  Negative for myalgias.  Skin:  Negative for rash.  Neurological:  Negative for weakness.   Physical Exam Updated Vital Signs BP 124/69 (BP Location: Right Arm)   Pulse 100   Temp 98.2 F (36.8 C) (Oral)   Resp 13   SpO2 97%   Physical Exam Vitals and nursing note reviewed.  Constitutional:      General: She is not in acute distress.    Appearance: She is well-developed.  HENT:      Head: Normocephalic and atraumatic.     Right Ear: External ear normal.     Left Ear: External ear normal.     Nose: Nose normal.  Eyes:     Conjunctiva/sclera: Conjunctivae normal.  Cardiovascular:     Rate and Rhythm: Normal rate and regular rhythm.     Heart sounds: No murmur heard. Pulmonary:     Effort: No respiratory distress.     Breath sounds: No wheezing, rhonchi or rales.  Abdominal:     Palpations: Abdomen is soft.     Tenderness: There is abdominal tenderness (suprapubic). There is no guarding or rebound.  Musculoskeletal:     Cervical back: Normal range of motion and neck supple.     Right lower leg: No  edema.     Left lower leg: No edema.  Skin:    General: Skin is warm and dry.     Findings: No rash.  Neurological:     General: No focal deficit present.     Mental Status: She is alert. Mental status is at baseline.     Motor: No weakness.  Psychiatric:        Mood and Affect: Mood normal.    ED Results / Procedures / Treatments   Labs (all labs ordered are listed, but only abnormal results are displayed) Labs Reviewed  URINALYSIS, ROUTINE W REFLEX MICROSCOPIC - Abnormal; Notable for the following components:      Result Value   Color, Urine AMBER (*)    Hgb urine dipstick SMALL (*)    Nitrite POSITIVE (*)    Bacteria, UA RARE (*)    All other components within normal limits  CBC WITH DIFFERENTIAL/PLATELET - Abnormal; Notable for the following components:   WBC 11.4 (*)    Neutro Abs 8.6 (*)    Monocytes Absolute 1.3 (*)    All other components within normal limits  COMPREHENSIVE METABOLIC PANEL - Abnormal; Notable for the following components:   Potassium 3.3 (*)    Glucose, Bld 154 (*)    Total Bilirubin 1.3 (*)    All other components within normal limits  CBG MONITORING, ED - Abnormal; Notable for the following components:   Glucose-Capillary 120 (*)    All other components within normal limits    EKG None  Radiology CT ABDOMEN PELVIS W  CONTRAST  Result Date: 12/18/2020 CLINICAL DATA:  Urinary urgency and retention. EXAM: CT ABDOMEN AND PELVIS WITH CONTRAST TECHNIQUE: Multidetector CT imaging of the abdomen and pelvis was performed using the standard protocol following bolus administration of intravenous contrast. CONTRAST:  124mL OMNIPAQUE IOHEXOL 300 MG/ML  SOLN COMPARISON:  None. FINDINGS: Lower chest: A 6 mm noncalcified lung nodule is seen within the posteromedial aspect of the right lower lobe (axial CT image 10, CT series 5). A 4 mm noncalcified lung nodule is seen within the anterolateral aspect of the left lower lobe (axial CT image 10, CT series 5). Hepatobiliary: No focal liver abnormality is seen. Status post cholecystectomy. No biliary dilatation. Pancreas: Unremarkable. No pancreatic ductal dilatation or surrounding inflammatory changes. Spleen: Normal in size without focal abnormality. Adrenals/Urinary Tract: Adrenal glands are unremarkable. Kidneys are normal in size, without obstructing renal calculi or focal lesions. Bilateral hydronephrosis is seen. The urinary bladder is moderately distended with thin septations noted within the inferior aspect of the bladder lumen on the right. A 5.3 cm x 2.1 cm well-defined area of heterogeneous increased attenuation is also seen within the inferolateral aspect of the bladder lumen on the right (approximately 36.82 Hounsfield units). This extends along the base of the bladder. An adjacent cluster of subcentimeter calcifications is also noted (axial CT images 60 through 63, CT series 3). A Foley catheter is seen with its distal tip and insufflator bulb seen below the anterior aspect of the urinary bladder, external to the bladder lumen (axial CT images 67 through 71, CT series 3). A mild-to-moderate amount of surrounding inflammatory fat stranding is seen. Stomach/Bowel: There is a small to moderate sized hiatal hernia. The appendix is not clearly identified. No evidence of bowel wall  thickening, distention, or inflammatory changes. Noninflamed diverticula are seen within the proximal sigmoid colon. Vascular/Lymphatic: Aortic atherosclerosis. No enlarged abdominal or pelvic lymph nodes. Reproductive: The uterus is poorly identified. The  bilateral adnexa are unremarkable. Other: No abdominal wall hernia or abnormality. No abdominopelvic ascites. Musculoskeletal: Degenerative changes seen throughout the lumbar spine. IMPRESSION: 1. Malpositioned Foley catheter which may be located within the uterus. 2. Heterogeneous area within the bladder lumen, as described above, which may represent sequelae associated with a urinary bladder mass. Given the position of this area, subsequent bladder outlet obstruction is of concern. Additional evaluation with MRI or cystography is recommended, as an underlying neoplasm cannot be excluded. 3. Small to moderate sized hiatal hernia. 4. Sigmoid diverticulosis. 5. Evidence of prior cholecystectomy. Electronically Signed   By: Virgina Norfolk M.D.   On: 12/18/2020 18:44   CT L-SPINE NO CHARGE  Result Date: 12/18/2020 CLINICAL DATA:  Back pain.  Urinary retention. EXAM: CT LUMBAR SPINE WITH CONTRAST TECHNIQUE: Technique: Multiplanar CT images of the lumbar spine were reconstructed from contemporary CT of the Abdomen and Pelvis. CONTRAST:  100 mL Omnipaque 300 given for concurrent CT of the abdomen and pelvis COMPARISON:  None FINDINGS: Segmentation: 5 lumbar type vertebrae. Alignment: Facet mediated anterolisthesis of L4 on L5 measuring 4 mm. Vertebrae: No acute fracture or destructive osseous process. Prominent Schmorl's node involving the L3 superior endplate on the left. 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 Paraspinal and other soft tissues: No acute abnormality identified in the paraspinal soft tissues.  Intra-abdominal and pelvic contents reported separately. Disc levels: T12-L1 and L1-2: Negative. L2-3: Disc bulging and a right foraminal disc protrusion result in mild right neural foraminal stenosis without evidence of significant spinal stenosis. L3-4: Minimal disc bulging and asymmetric mild right facet arthrosis without evidence of significant stenosis. L4-5: Moderate disc space narrowing. Anterolisthesis with bulging uncovered disc and severe facet arthrosis result in severe spinal stenosis and severe left greater than right neural foraminal stenosis. L5-S1: Disc bulging and severe right and mild left facet arthrosis result in mild bilateral neural foraminal stenosis. A small left paracentral disc protrusion is questioned. No significant spinal stenosis. IMPRESSION: 1. Severe L4-5 facet arthrosis with grade 1 anterolisthesis, severe spinal stenosis, and severe left greater than right neural foraminal stenosis. 2. Mild neural foraminal stenosis at L2-3 and L5-S1. Electronically Signed   By: Logan Bores M.D.   On: 12/18/2020 18:32    Procedures Procedures   Medications Ordered in ED Medications  iohexol (OMNIPAQUE) 300 MG/ML solution 100 mL (100 mLs Intravenous Contrast Given 12/18/20 1735)    ED Course  I have reviewed the triage vital signs and the nursing notes.  Pertinent labs & imaging results that were available during my care of the patient were reviewed by me and considered in my medical decision making (see chart for details).  Patient seen and examined. Work-up initiated.  Will need Foley placement.  Lumbar spine films ordered.  Vital signs reviewed and are as follows: BP (!) 133/57   Pulse 89   Temp 98.2 F (36.8 C) (Oral)   Resp 17   SpO2 99%   Labs with normal white blood cell count, normal renal function.  Will obtain CT abdomen and pelvis to evaluate for any abnormality which could cause patient's urinary retention.  UA without definite infection.  CT imaging  demonstrates pulmonary nodules and bladder mass.  It does not appear that the Foley is properly positioned.  The balloon is definitely outside of the bladder lumen.  On exam, patient with good fluid output  through the catheter.  Pt states she feels more comfortable.  Nurse does not report any significant resistance and patient does not report any pain with placement.  Given urine return, expect that the catheter is in the bladder but the balloon may not have made it all the way due to the mass.  Bedside US appears similar to CT imaging. Dr. Ronnald Nian at bedside during imaging. Will discuss with urology.   8:49 PM Patient seen by Dr. Matilde Sprang who has seen patient.  After further discussion with radiology, patient felt to have a decompressed bladder and a pelvic mass.  She is aware of results.  She will need GYN follow-up for this.  She will need urology follow-up in regards to the outlet obstruction.  Plan for discharged home with leg bag.  Referral given.  I have sent a message to the patient's primary provider so they are aware with going on with her.  Patient urged to return with worsening symptoms or other concerns. Patient verbalized understanding and agrees with plan. Patient is anxious to go home to take care of her pets.    MDM Rules/Calculators/A&P                          Patient with bladder outlet obstruction secondary to pelvic mass.  No AKI.  Patient will be discharged home with Foley catheter.  Will need GYN, urology, PCP follow-up as above.   Final Clinical Impression(s) / ED Diagnoses Final diagnoses:  Urinary retention  Back pain  Pelvic mass    Rx / DC Orders ED Discharge Orders     None        Carlisle Cater, PA-C 12/18/20 2051    Lennice Sites, DO 12/18/20 [place], [place], DO 12/18/20 2324

## 2020-12-18 NOTE — ED Notes (Signed)
Leg bag applied to foley catheter.  

## 2020-12-18 NOTE — ED Triage Notes (Signed)
Pt presents with urinary urgency and retention since last night. Pt has an increase urge to urinate but is only passing small amounts of urine. Denies any blood in her urine. Pt also reports lower back pain a few weeks ago.  Pt seen at family practice prior to coming here, physician over there told her the bladder scan showed her "bladder was full" pt reports she is leaking when she walks but unable to fully empty her bladder

## 2020-12-18 NOTE — Progress Notes (Signed)
    SUBJECTIVE:   CHIEF COMPLAINT / HPI:   Abdominal discomfort: 75 year old female presenting with abdominal discomfort for 1 day.  She states that she has had some difficulty urinating during this time and keeps feeling like she needs to pee. She denies having any trouble urinated normally. She only has a small trickle of urine and has trouble urinating. She states around 7:30pm she felt like she really needed to urinate but couldn't. She denies any OTC medication or any new medications. Last bowel movement was last night and this morning. States she tends to stay more on the loose side rather than constipated.  She does have some back pain has been going on for several months.  Denies any saddle paresthesias or urinary or stool incontinence.  She states that she does have a small trickle of urine sometimes when she walks but is unable to void when attempting over the past 18 hours or so.  PERTINENT  PMH / PSH: None relevant  OBJECTIVE:   BP 125/87   Pulse (!) 104   Ht 5\' 2"  (1.575 m)   Wt 153 lb 6.4 oz (69.6 kg)   SpO2 98%   BMI 28.06 kg/m   HR recheck 88  General: NAD, pleasant, able to participate in exam Cardiac: RRR, no murmurs. Respiratory: CTAB, normal effort Abdomen: Bowel sounds present, mild suprapubic discomfort on palpation, she does have a palpable bladder on physical exam. Neuro: alert, no obvious focal deficits Psych: Normal affect and mood  ASSESSMENT/PLAN:   Urinary retention 75 year old with abdominal discomfort for 1 day.  She does not have any history of urinary retention but states that she felt like she needed to urinate last night and was unable to go.  She has tried multiple times to go to the bathroom but at most only has a small trickle.  Ultrasound was performed in office today which showed a grossly enlarged bladder.  The patient was able to give a small amount of urine for urinalysis which was not suggestive of any infection.  She has not tried any new  medications or over-the-counter medications.  She has not used any anticholinergics that could explain her symptoms.  She did state that she was planning to be worked up by her PCP for low back pain which may be related.  The back pain is been going on for several months and is not waking her up from sleep.  Did discuss with the patient that I recommend she go to the emergency department as we do not have Foley catheter supplies here in the office.  I did reach out to the charge nurse to let him know that she would be coming over for Foley catheter in for further work-up for her urinary retention.   Lurline Del, [place]

## 2020-12-18 NOTE — ED Notes (Signed)
Paged Urology

## 2020-12-18 NOTE — ED Provider Notes (Signed)
Emergency Medicine Provider Triage Evaluation Note  Becky Fields , a 75 y.o. female  was evaluated in triage.  Pt complains of inability to empty her bladder.  She states that since last night she has had an increased urge to urinate however is only passing small amounts of urine and feels like she cannot empty her bladder.  She denies any blood in her urine.  She did have lower back pain about a week ago, and had outpatient x-ray and bone density scan ordered per her report which have not been done yet.  She does not have a history of similar.  She denies any medication changes.  No dysuria. In triage her bladder scan shows over 643 mL of urine.  She reports she has had pain in her rectum additionally..  Review of Systems  Positive: Urinary retention, pelvic pressure Negative: Fevers  Physical Exam  BP 114/72 (BP Location: Left Arm)   Pulse 93   Temp 98.2 F (36.8 C) (Oral)   Resp 16   SpO2 98%  Gen:   Awake, appears uncomfortable Resp:  Normal effort  MSK:   Moves extremities without difficulty  Other:  Standing per preference.   Medical Decision Making  Medically screening exam initiated at 2:46 PM.  Appropriate orders placed.  [name] was informed that the remainder of the evaluation will be completed by another provider, this initial triage assessment does not replace that evaluation, and the importance of remaining in the ED until their evaluation is complete.  Patient is over 623 mL of urine on her bladder on bladder scan.  She also has symptoms consistent with incomplete bladder emptying.  I discussed with her risks and benefits of Foley catheter placement, and patient gives a verbal consent.  Orders placed for Foley catheter placement.   Lorin Glass, PA-C 12/18/20 1448    Sherwood Gambler, MD 12/21/20 [phone]

## 2020-12-18 NOTE — Patient Instructions (Signed)
I recommend that you go to the emergency department to be evaluated.  On ultrasound we saw a lot of urinary retention.  Because you have had back pain lately they want to do more imaging for the back pain.  Let us know if you have any other questions or concerns and will be on the look out for the note from the emergency department.

## 2020-12-18 NOTE — Discharge Instructions (Addendum)
Please read and follow all provided instructions.  Your diagnoses today include:  1. Pelvic mass   2. Urinary retention   3. Back pain   4. Spinal stenosis, unspecified spinal region     Tests performed today include: Blood cell counts (white, red, and platelets) Electrolytes  Kidney function test Urine test to check for infection CT of your abdomen pelvis -shows signs of a pelvic mass which will need to be evaluated by a gynecologist.  Also shows signs of spinal stenosis which may be the cause of your back pain. Vital signs. See below for your results today.   Medications prescribed:  None  Take any prescribed medications only as directed.  Home care instructions:  Follow any educational materials contained in this packet.  BE VERY CAREFUL not to take multiple medicines containing Tylenol (also called acetaminophen). Doing so can lead to an overdose which can damage your liver and cause liver failure and possibly death.   Follow-up instructions: Please follow-up with your PCP in the gynecology referral listed tomorrow for further instructions.  Return instructions:  Please return to the Emergency Department if you experience worsening symptoms.  Return with worsening abdominal pain, difficulty with urination. Please return if you have any other emergent concerns.  Additional Information:  Your vital signs today were: BP (!) 155/79   Pulse 90   Temp 98.8 F (37.1 C) (Oral)   Resp 16   SpO2 95%  If your blood pressure (BP) was elevated above 135/85 this visit, please have this repeated by your doctor within one month. --------------

## 2020-12-18 NOTE — Progress Notes (Signed)
u

## 2020-12-19 ENCOUNTER — Telehealth: Payer: Self-pay

## 2020-12-19 NOTE — Telephone Encounter (Signed)
Patient returns call to nurse line requesting to speak with Dr. Vanessa [place] regarding results from yesterday's visit.   Will forward to provider.   Talbot Grumbling, RN

## 2020-12-19 NOTE — Telephone Encounter (Signed)
Patient LVM on nurse line stating she has an apt for back xray on 7/5, however was seen in ED and had a CT done yesterday. Patient does not feel she needs to proceed with xrays, however wants PCP opinion. Will forward to PCP.

## 2020-12-20 ENCOUNTER — Telehealth: Payer: Self-pay | Admitting: *Deleted

## 2020-12-20 ENCOUNTER — Emergency Department (HOSPITAL_BASED_OUTPATIENT_CLINIC_OR_DEPARTMENT_OTHER)
Admission: EM | Admit: 2020-12-20 | Discharge: 2020-12-20 | Disposition: A | Discharge status: Home / Self Care | Payer: Medicare HMO | Attending: Emergency Medicine | Admitting: Emergency Medicine

## 2020-12-20 ENCOUNTER — Encounter (HOSPITAL_BASED_OUTPATIENT_CLINIC_OR_DEPARTMENT_OTHER): Payer: Self-pay | Admitting: Emergency Medicine

## 2020-12-20 ENCOUNTER — Other Ambulatory Visit: Payer: Self-pay

## 2020-12-20 ENCOUNTER — Emergency Department (HOSPITAL_BASED_OUTPATIENT_CLINIC_OR_DEPARTMENT_OTHER): Payer: Medicare HMO

## 2020-12-20 DIAGNOSIS — Z7984 Long term (current) use of oral hypoglycemic drugs: Secondary | ICD-10-CM | POA: Insufficient documentation

## 2020-12-20 DIAGNOSIS — R102 Pelvic and perineal pain: Secondary | ICD-10-CM | POA: Insufficient documentation

## 2020-12-20 DIAGNOSIS — Z79899 Other long term (current) drug therapy: Secondary | ICD-10-CM | POA: Diagnosis not present

## 2020-12-20 DIAGNOSIS — E1140 Type 2 diabetes mellitus with diabetic neuropathy, unspecified: Secondary | ICD-10-CM | POA: Diagnosis not present

## 2020-12-20 DIAGNOSIS — R319 Hematuria, unspecified: Secondary | ICD-10-CM | POA: Insufficient documentation

## 2020-12-20 DIAGNOSIS — I10 Essential (primary) hypertension: Secondary | ICD-10-CM | POA: Diagnosis not present

## 2020-12-20 LAB — URINALYSIS, ROUTINE W REFLEX MICROSCOPIC
Bilirubin Urine: NEGATIVE
Glucose, UA: NEGATIVE mg/dL
Ketones, ur: NEGATIVE mg/dL
Nitrite: NEGATIVE
Protein, ur: 30 mg/dL — AB
RBC / HPF: >50 RBC/hpf — ABNORMAL HIGH (ref 0–5)
Specific Gravity, Urine: 1.017 (ref 1.005–1.030)
pH: 5 (ref 5.0–8.0)

## 2020-12-20 LAB — BASIC METABOLIC PANEL
Anion gap: 15 (ref 5–15)
BUN: 20 mg/dL (ref 8–23)
CO2: 16 mmol/L — ABNORMAL LOW (ref 22–32)
Calcium: 9.6 mg/dL (ref 8.9–10.3)
Chloride: 108 mmol/L (ref 98–111)
Creatinine, Ser: 0.75 mg/dL (ref 0.44–1.00)
GFR, Estimated: >60 mL/min (ref >60)
Glucose, Bld: 107 mg/dL — ABNORMAL HIGH (ref 70–99)
Potassium: 3.8 mmol/L (ref 3.5–5.1)
Sodium: 139 mmol/L (ref 135–145)

## 2020-12-20 LAB — CBC WITH DIFFERENTIAL/PLATELET
Abs Immature Granulocytes: 0.03 10*3/uL (ref 0.00–0.07)
Basophils Absolute: 0 10*3/uL (ref 0.0–0.1)
Basophils Relative: 1 %
Eosinophils Absolute: 0.3 10*3/uL (ref 0.0–0.5)
Eosinophils Relative: 3 %
HCT: 43.5 % (ref 36.0–46.0)
Hemoglobin: 14.7 g/dL (ref 12.0–15.0)
Immature Granulocytes: 0 %
Lymphocytes Relative: 19 %
Lymphs Abs: 1.6 10*3/uL (ref 0.7–4.0)
MCH: 31.1 pg (ref 26.0–34.0)
MCHC: 33.8 g/dL (ref 30.0–36.0)
MCV: 92.2 fL (ref 80.0–100.0)
Monocytes Absolute: 0.9 10*3/uL (ref 0.1–1.0)
Monocytes Relative: 10 %
Neutro Abs: 5.7 10*3/uL (ref 1.7–7.7)
Neutrophils Relative %: 67 %
Platelets: 153 10*3/uL (ref 150–400)
RBC: 4.72 MIL/uL (ref 3.87–5.11)
RDW: 12.1 % (ref 11.5–15.5)
WBC: 8.5 10*3/uL (ref 4.0–10.5)
nRBC: 0 % (ref 0.0–0.2)

## 2020-12-20 LAB — URINALYSIS, MICROSCOPIC (REFLEX): RBC / HPF: >50 RBC/hpf (ref 0–5)

## 2020-12-20 NOTE — Discharge Instructions (Addendum)
Call your primary care doctor or specialist as discussed in the next 2-3 days.   Return immediately back to the ER if:  Your symptoms worsen within the next 12-24 hours. You develop new symptoms such as new fevers, persistent vomiting, new pain, shortness of breath, or new weakness or numbness, or if you have any other concerns.  

## 2020-12-20 NOTE — Telephone Encounter (Signed)
Patient returns call to nurse line regarding care for foley catheter. Patient has concerns as she is not going to see specialist until 01/07/21. Patient has questions on further catheter care and if she is supposed to keep the same bag until follow up appointment.   Patient is also requesting to speak with PCP over the phone when he is back in the office.   Talbot Grumbling, RN

## 2020-12-20 NOTE — ED Triage Notes (Signed)
Pt has a urinary catheter. She has a mass on her bladder. C/o hematuria today.

## 2020-12-20 NOTE — ED Provider Notes (Signed)
[facility] EMERGENCY DEPT Provider Note   CSN: [id number] Arrival date & time: 12/20/20  1407     History Chief Complaint  Patient presents with   Hematuria    Becky Fields is a 75 y.o. female.  Patient presents with hematuria.  She states that she was evaluated just a few days ago at [facility] and told that she had a pelvic mass.  She has outpatient follow-up with gynecologist set up within the next several days, however she also had a Foley catheter placed.  She states that she had normal urine output until earlier today noticed a gush of blood from the catheter.  This appears to have resolved and now she is putting out normal appearing urine but she is concerned that she may be obstructed and urinating.  Denies fevers or cough or vomiting or diarrhea.  Denies abdominal pain, but describes as a fullness sensation.      Past Medical History:  Diagnosis Date   Cataract    Diabetes mellitus    Ejection fraction    .   Hepatitis C    Hypertension    Hypertriglyceridemia    Takotsubo syndrome    October 16, 2012    Patient Active Problem List   Diagnosis Date Noted   Urinary retention 12/18/2020   Low back pain 12/05/2020   Estrogen deficiency 12/05/2020   Plantar callus 06/27/2020   Preventative health care 12/22/2018   Trigger finger, acquired 11/18/2016   Hearing loss secondary to cerumen impaction, left 11/18/2016   Hepatic cirrhosis ([place]) 05/23/2014   Mitral regurgitation 10/18/2012   Takotsubo syndrome 10/18/2012   HYPERTRIGLYCERIDEMIA 01/25/2009   INSOMNIA, PERSISTENT 01/25/2009   Type 2 diabetes mellitus with peripheral neuropathy ([place]) 04/13/2008   OBESITY 02/21/2008   History of colonic polyps 05/02/2007   Chronic hepatitis C without hepatic coma ([place]) 03/07/2007   HYPERTENSION, BENIGN ESSENTIAL 01/28/2007    Past Surgical History:  Procedure Laterality Date   CHOLECYSTECTOMY     COLONOSCOPY     EYE SURGERY  02/2012   bilateral cataract  surg   LEFT HEART CATHETERIZATION WITH CORONARY ANGIOGRAM N/A 10/17/2012   Procedure: LEFT HEART CATHETERIZATION WITH CORONARY ANGIOGRAM;  Surgeon: Sherren Mocha, MD;  Location: [facility] CATH LAB;  Service: Cardiovascular;  Laterality: N/A;     OB History   No obstetric history on file.     Family History  Problem Relation Age of Onset   Aneurysm Father    Hypertension Mother    CAD Brother 93   Hypertension Sister    Heart attack Neg Hx    Stroke Neg Hx    Colon cancer Neg Hx     Social History   Tobacco Use   Smoking status: Never   Smokeless tobacco: Never  Substance Use Topics   Alcohol use: Yes    Alcohol/week: 6.0 standard drinks    Types: 6 Standard drinks or equivalent per week    Comment: Occasional glass of wine on the weekends   Drug use: No    Home Medications Prior to Admission medications   Medication Sig Start Date End Date Taking? Authorizing Provider  albuterol (VENTOLIN HFA) 108 (90 Base) MCG/ACT inhaler Inhale 2 puffs into the lungs every 4 (four) hours as needed for wheezing or shortness of breath. 04/13/19   Shirley, [place], DO  losartan (COZAAR) 100 MG tablet Take 1 tablet by mouth once daily Patient taking differently: Take 100 mg by mouth every evening. 08/26/20   Madison Hickman  A, MD  metFORMIN (GLUCOPHAGE-XR) 500 MG 24 hr tablet TAKE 2 TABLETS BY MOUTH ONCE DAILY WITH BREAKFAST Patient taking differently: Take 1,000 mg by mouth daily with breakfast. 06/27/20   Hensel, Jamal Collin, MD  metoprolol succinate (TOPROL-XL) 50 MG 24 hr tablet TAKE 1 TABLET BY MOUTH AT BEDTIME WITH A MEAL OR  IMMEDIATELY  FOLLOWING  A  MEAL Patient taking differently: Take 50 mg by mouth every evening. 11/11/20   Hensel, Jamal Collin, MD  Polyethyl Glycol-Propyl Glycol (SYSTANE OP) Apply 1 drop to eye 2 (two) times daily as needed (dry eyes). Reported on 09/05/2015    [provider]  pravastatin (PRAVACHOL) 40 MG tablet Take 1 tablet by mouth once daily Patient taking  differently: Take 40 mg by mouth daily. 09/13/20   Zenia Resides, MD    Allergies    Lisinopril and Sulfa antibiotics  Review of Systems   Review of Systems  Constitutional:  Negative for fever.  HENT:  Negative for ear pain.   Eyes:  Negative for pain.  Respiratory:  Negative for cough.   Cardiovascular:  Negative for chest pain.  Gastrointestinal:  Negative for abdominal pain.  Genitourinary:  Negative for flank pain.  Musculoskeletal:  Negative for back pain.  Skin:  Negative for rash.  Neurological:  Negative for headaches.   Physical Exam Updated Vital Signs BP (!) 117/107 (BP Location: Right Arm)   Pulse 71   Temp 98.5 F (36.9 C)   Resp 17   Ht 5\' 2"  (1.575 m)   Wt 69.4 kg   SpO2 99%   BMI 27.98 kg/m   Physical Exam Constitutional:      General: She is not in acute distress.    Appearance: Normal appearance.  HENT:     Head: Normocephalic.     Nose: Nose normal.  Eyes:     Extraocular Movements: Extraocular movements intact.  Cardiovascular:     Rate and Rhythm: Normal rate.  Pulmonary:     Effort: Pulmonary effort is normal.  Musculoskeletal:        General: Normal range of motion.     Cervical back: Normal range of motion.  Neurological:     General: No focal deficit present.     Mental Status: She is alert. Mental status is at baseline.    ED Results / Procedures / Treatments   Labs (all labs ordered are listed, but only abnormal results are displayed) Labs Reviewed  URINALYSIS, ROUTINE W REFLEX MICROSCOPIC - Abnormal; Notable for the following components:      Result Value   Color, Urine RED (*)    APPearance TURBID (*)    Glucose, UA   (*)    Value: TEST NOT REPORTED DUE TO COLOR INTERFERENCE OF URINE PIGMENT   Hgb urine dipstick   (*)    Value: TEST NOT REPORTED DUE TO COLOR INTERFERENCE OF URINE PIGMENT   Bilirubin Urine   (*)    Value: TEST NOT REPORTED DUE TO COLOR INTERFERENCE OF URINE PIGMENT   Ketones, ur   (*)    Value: TEST  NOT REPORTED DUE TO COLOR INTERFERENCE OF URINE PIGMENT   Protein, ur   (*)    Value: TEST NOT REPORTED DUE TO COLOR INTERFERENCE OF URINE PIGMENT   Nitrite   (*)    Value: TEST NOT REPORTED DUE TO COLOR INTERFERENCE OF URINE PIGMENT   Leukocytes,Ua   (*)    Value: TEST NOT REPORTED DUE TO COLOR INTERFERENCE OF  URINE PIGMENT   All other components within normal limits  URINALYSIS, MICROSCOPIC (REFLEX) - Abnormal; Notable for the following components:   Bacteria, UA RARE (*)    All other components within normal limits  BASIC METABOLIC PANEL - Abnormal; Notable for the following components:   CO2 16 (*)    Glucose, Bld 107 (*)    All other components within normal limits  URINALYSIS, ROUTINE W REFLEX MICROSCOPIC - Abnormal; Notable for the following components:   APPearance HAZY (*)    Hgb urine dipstick LARGE (*)    Protein, ur 30 (*)    Leukocytes,Ua TRACE (*)    RBC / HPF >50 (*)    All other components within normal limits  CBC WITH DIFFERENTIAL/PLATELET    EKG None  Radiology US PELVIC COMPLETE WITH TRANSVAGINAL  Result Date: 12/20/2020 CLINICAL DATA:  Hematuria pelvic pressure EXAM: TRANSABDOMINAL AND TRANSVAGINAL ULTRASOUND OF PELVIS TECHNIQUE: Both transabdominal and transvaginal ultrasound examinations of the pelvis were performed. Transabdominal technique was performed for global imaging of the pelvis including uterus, ovaries, adnexal regions, and pelvic cul-de-sac. It was necessary to proceed with endovaginal exam following the transabdominal exam to visualize the uterus and adnexa. COMPARISON:  CT 12/18/2020 FINDINGS: Uterus Measurements: 6.6 x 1.7 x 2.8 cm = volume: 16.5 mL. No fibroids or other mass visualized. Endometrium Unable to measure, poorly visualized Right ovary Unable to visualize Left ovary Unable to visualize Other findings Large complex cystic mass with thickened septations containing flow. This measures 12.8 x 8 x 10.9 cm and is seen posterior to the uterus.  Evaluation of patient's Foley catheter demonstrates Foley balloon within decompressed urinary bladder. IMPRESSION: 1. Foley catheter is present within decompressed urinary bladder. 2. Large 12.8 cm complex suspicious cystic mass posterior to the uterus, suspect that this is adnexal in origin though unable to visualize either ovary. Primary concern is for ovarian neoplasm. Surgical consultation is recommended. Electronically Signed   By: Donavan Foil M.D.   On: 12/20/2020 19:36    Procedures Procedures   Medications Ordered in ED Medications - No data to display  ED Course  I have reviewed the triage vital signs and the nursing notes.  Pertinent labs & imaging results that were available during my care of the patient were reviewed by me and considered in my medical decision making (see chart for details).    MDM Rules/Calculators/A&P                          Abdominal exam is relatively benign.  Some mild tenderness in the suprapubic region no guarding or rebound.  Labs unremarkable.  Initial Foley catheter bag had bright red blood, however this was emptied out and subsequent Foley catheter bags have shown yellow appearing urine.  Ultrasound shows no urine retention.  There are no clots seen in the UA at this time.  Ultrasound consistent with pelvic masses for which the patient has follow-up arranged.  Patient discharged home to follow-up on an outpatient basis with her specialist within the week.  Advised return if she has fevers or she is not putting out any urine to return immediately back to the ER.  Final Clinical Impression(s) / ED Diagnoses Final diagnoses:  Pelvic pain    Rx / DC Orders ED Discharge Orders     None        Luna Fuse, MD 12/20/20 [phone]

## 2020-12-20 NOTE — Telephone Encounter (Signed)
Patient called and stated "I need an appt for a pelvic mass that is pushing on my bladder." Explained that the office will review her records and call her next with a possible appt.

## 2020-12-24 ENCOUNTER — Other Ambulatory Visit: Payer: Self-pay | Admitting: Family Medicine

## 2020-12-24 ENCOUNTER — Other Ambulatory Visit: Payer: Medicare HMO

## 2020-12-24 ENCOUNTER — Telehealth: Payer: Self-pay | Admitting: *Deleted

## 2020-12-24 ENCOUNTER — Other Ambulatory Visit: Payer: Self-pay

## 2020-12-24 DIAGNOSIS — R1900 Intra-abdominal and pelvic swelling, mass and lump, unspecified site: Secondary | ICD-10-CM | POA: Diagnosis not present

## 2020-12-24 DIAGNOSIS — I10 Essential (primary) hypertension: Secondary | ICD-10-CM

## 2020-12-24 DIAGNOSIS — E781 Pure hyperglyceridemia: Secondary | ICD-10-CM | POA: Diagnosis not present

## 2020-12-24 NOTE — Telephone Encounter (Signed)
Spoke with the patient and scheduled a new patient appt with Dr Denman George on 7/7 at 12 pm; patient given an arrival time of 11:30 am. Patient aware of the address and phone number for the clinic

## 2020-12-24 NOTE — Assessment & Plan Note (Signed)
Called patient.  Still has foley.  Has gyn/onc appointment on 7/19.  I will order Ca 125

## 2020-12-25 ENCOUNTER — Telehealth: Payer: Self-pay | Admitting: Family Medicine

## 2020-12-25 ENCOUNTER — Encounter: Payer: Self-pay | Admitting: Gynecologic Oncology

## 2020-12-25 LAB — LIPID PANEL
Chol/HDL Ratio: 4.2 ratio (ref 0.0–4.4)
Cholesterol, Total: 169 mg/dL (ref 100–199)
HDL: 40 mg/dL (ref >39)
LDL Chol Calc (NIH): 72 mg/dL (ref 0–99)
Triglycerides: 362 mg/dL — ABNORMAL HIGH (ref 0–149)
VLDL Cholesterol Cal: 57 mg/dL — ABNORMAL HIGH (ref 5–40)

## 2020-12-25 LAB — CMP14+EGFR
ALT: 15 IU/L (ref 0–32)
AST: 12 IU/L (ref 0–40)
Albumin/Globulin Ratio: 1.6 (ref 1.2–2.2)
Albumin: 4.1 g/dL (ref 3.7–4.7)
Alkaline Phosphatase: 71 IU/L (ref 44–121)
BUN/Creatinine Ratio: 27 (ref 12–28)
BUN: 19 mg/dL (ref 8–27)
Bilirubin Total: 0.5 mg/dL (ref 0.0–1.2)
CO2: 22 mmol/L (ref 20–29)
Calcium: 9 mg/dL (ref 8.7–10.3)
Chloride: 102 mmol/L (ref 96–106)
Creatinine, Ser: 0.71 mg/dL (ref 0.57–1.00)
Globulin, Total: 2.5 g/dL (ref 1.5–4.5)
Glucose: 106 mg/dL — ABNORMAL HIGH (ref 65–99)
Potassium: 4.2 mmol/L (ref 3.5–5.2)
Sodium: 138 mmol/L (ref 134–144)
Total Protein: 6.6 g/dL (ref 6.0–8.5)
eGFR: 89 mL/min/{1.73_m2} (ref >59)

## 2020-12-25 LAB — CA 125: Cancer Antigen (CA) 125: 16.8 U/mL (ref 0.0–38.1)

## 2020-12-25 NOTE — Telephone Encounter (Signed)
Called and informed normal Ca 125.  Added proviso that this is an imperfect test so cancer is still a distinct possibility.  She has an appointment tomorrow with Dr. Denman George of Gyn/Onc.

## 2020-12-26 ENCOUNTER — Inpatient Hospital Stay: Payer: Medicare HMO | Attending: Gynecologic Oncology | Admitting: Gynecologic Oncology

## 2020-12-26 ENCOUNTER — Other Ambulatory Visit: Payer: Self-pay

## 2020-12-26 ENCOUNTER — Inpatient Hospital Stay (HOSPITAL_BASED_OUTPATIENT_CLINIC_OR_DEPARTMENT_OTHER): Payer: Medicare HMO | Admitting: Gynecologic Oncology

## 2020-12-26 ENCOUNTER — Encounter: Payer: Self-pay | Admitting: Gynecologic Oncology

## 2020-12-26 VITALS — BP 108/81 | HR 74 | Temp 98.7°F | Resp 20 | Ht 62.0 in | Wt 147.0 lb

## 2020-12-26 DIAGNOSIS — I10 Essential (primary) hypertension: Secondary | ICD-10-CM | POA: Diagnosis not present

## 2020-12-26 DIAGNOSIS — Z79899 Other long term (current) drug therapy: Secondary | ICD-10-CM | POA: Diagnosis not present

## 2020-12-26 DIAGNOSIS — R339 Retention of urine, unspecified: Secondary | ICD-10-CM

## 2020-12-26 DIAGNOSIS — R1900 Intra-abdominal and pelvic swelling, mass and lump, unspecified site: Secondary | ICD-10-CM

## 2020-12-26 DIAGNOSIS — E781 Pure hyperglyceridemia: Secondary | ICD-10-CM | POA: Insufficient documentation

## 2020-12-26 DIAGNOSIS — E1136 Type 2 diabetes mellitus with diabetic cataract: Secondary | ICD-10-CM | POA: Diagnosis not present

## 2020-12-26 DIAGNOSIS — Z7984 Long term (current) use of oral hypoglycemic drugs: Secondary | ICD-10-CM | POA: Insufficient documentation

## 2020-12-26 MED ORDER — SENNOSIDES-DOCUSATE SODIUM 8.6-50 MG PO TABS: 2.0000 | ORAL_TABLET | Freq: Every day | ORAL | 0 refills | Status: DC

## 2020-12-26 MED ORDER — TRAMADOL HCL 50 MG PO TABS: 50.0000 mg | ORAL_TABLET | Freq: Four times a day (QID) | ORAL | 0 refills | Status: DC | PRN

## 2020-12-26 MED ORDER — IBUPROFEN 800 MG PO TABS: 800.0000 mg | ORAL_TABLET | Freq: Three times a day (TID) | ORAL | 0 refills | Status: DC | PRN

## 2020-12-26 NOTE — Patient Instructions (Addendum)
Preparing for your Surgery  Plan for surgery on January 02, 2021 with Dr. Everitt Amber at [facility] will be scheduled for a robotic assisted total laparoscopic hysterectomy (removal of the uterus and cervix), bilateral salpingo-oophorectomy (removal of both ovaries and fallopian tubes), possible staging.    Pre-operative Testing -You will receive a phone call from presurgical testing at [facility] to arrange for a pre-operative appointment and lab work.  -Bring your insurance card, copy of an advanced directive if applicable, medication list  -At that visit, you will be asked to sign a consent for a possible blood transfusion in case a transfusion becomes necessary during surgery.  The need for a blood transfusion is rare but having consent is a necessary part of your care.     -You should not be taking blood thinners or aspirin at least ten days prior to surgery unless instructed by your surgeon.  -Do not take supplements such as fish oil (omega 3), red yeast rice, turmeric before your surgery. You want to avoid medications with aspirin in them including headache powders such as BC or Goody's), Excedrin migraine.  Day Before Surgery at [facility] will be asked to take in a light diet the day before surgery. You will be advised you can have clear liquids up until 3 hours before your surgery.    Eat a light diet the day before surgery.  Examples including soups, broths, toast, yogurt, mashed potatoes.  AVOID GAS PRODUCING FOODS. Things to avoid include carbonated beverages (fizzy beverages, sodas), raw fruits and raw vegetables (uncooked), or beans.   If your bowels are filled with gas, your surgeon will have difficulty visualizing your pelvic organs which increases your surgical risks.  Your role in recovery Your role is to become active as soon as directed by your doctor, while still giving yourself time to heal.  Rest when you feel tired. You will be asked to do the  following in order to speed your recovery:  - Cough and breathe deeply. This helps to clear and expand your lungs and can prevent pneumonia after surgery.  - Elaine. Do mild physical activity. Walking or moving your legs help your circulation and body functions return to normal. Do not try to get up or walk alone the first time after surgery.   -If you develop swelling on one leg or the other, pain in the back of your leg, redness/warmth in one of your legs, please call the office or go to the Emergency Room to have a doppler to rule out a blood clot. For shortness of breath, chest pain-seek care in the Emergency Room as soon as possible. - Actively manage your pain. Managing your pain lets you move in comfort. We will ask you to rate your pain on a scale of zero to 10. It is your responsibility to tell your doctor or nurse where and how much you hurt so your pain can be treated.  Special Considerations -If you are diabetic, you may be placed on insulin after surgery to have closer control over your blood sugars to promote healing and recovery.  This does not mean that you will be discharged on insulin.  If applicable, your oral antidiabetics will be resumed when you are tolerating a solid diet.  -Your final pathology results from surgery should be available around one week after surgery and the results will be relayed to you when available.  -FMLA forms can be faxed to  [phone] and please allow 5-7 business days for completion.  Pain Management After Surgery -You have been prescribed your pain medication and bowel regimen medications before surgery so that you can have these available when you are discharged from the hospital. The pain medication is for use ONLY AFTER surgery and a new prescription will not be given.   -Make sure that you have Tylenol and Ibuprofen at home to use on a regular basis after surgery for pain control. We recommend alternating the medications  every hour to six hours since they work differently and are processed in the body differently for pain relief.  -Review the attached handout on narcotic use and their risks and side effects.   Bowel Regimen -You have been prescribed Sennakot-S to take nightly to prevent constipation especially if you are taking the narcotic pain medication intermittently.  It is important to prevent constipation and drink adequate amounts of liquids. You can stop taking this medication when you are not taking pain medication and you are back on your normal bowel routine.  Risks of Surgery Risks of surgery are low but include bleeding, infection, damage to surrounding structures, re-operation, blood clots, and very rarely death.   Blood Transfusion Information (For the consent to be signed before surgery)  We will be checking your blood type before surgery so in case of emergencies, we will know what type of blood you would need.                                            WHAT IS A BLOOD TRANSFUSION?  A transfusion is the replacement of blood or some of its parts. Blood is made up of multiple cells which provide different functions. Red blood cells carry oxygen and are used for blood loss replacement. White blood cells fight against infection. Platelets control bleeding. Plasma helps clot blood. Other blood products are available for specialized needs, such as hemophilia or other clotting disorders. BEFORE THE TRANSFUSION  Who gives blood for transfusions?  You may be able to donate blood to be used at a later date on yourself (autologous donation). Relatives can be asked to donate blood. This is generally not any safer than if you have received blood from a stranger. The same precautions are taken to ensure safety when a relative's blood is donated. Healthy volunteers who are fully evaluated to make sure their blood is safe. This is blood bank blood. Transfusion therapy is the safest it has ever been in  the practice of medicine. Before blood is taken from a donor, a complete history is taken to make sure that person has no history of diseases nor engages in risky social behavior (examples are intravenous drug use or sexual activity with multiple partners). The donor's travel history is screened to minimize risk of transmitting infections, such as malaria. The donated blood is tested for signs of infectious diseases, such as HIV and hepatitis. The blood is then tested to be sure it is compatible with you in order to minimize the chance of a transfusion reaction. If you or a relative donates blood, this is often done in anticipation of surgery and is not appropriate for emergency situations. It takes many days to process the donated blood. RISKS AND COMPLICATIONS Although transfusion therapy is very safe and saves many lives, the main dangers of transfusion include:  Getting an infectious disease. Developing a  transfusion reaction. This is an allergic reaction to something in the blood you were given. Every precaution is taken to prevent this. The decision to have a blood transfusion has been considered carefully by your caregiver before blood is given. Blood is not given unless the benefits outweigh the risks.  AFTER SURGERY INSTRUCTIONS  Return to work: 4 weeks if applicable  Activity: 1. Be up and out of the bed during the day.  Take a nap if needed.  You may walk up steps but be careful and use the hand rail.  Stair climbing will tire you more than you think, you may need to stop part way and rest.   2. No lifting or straining for 6 weeks over 10 pounds. No pushing, pulling, straining for 6 weeks.  3. No driving for 1 week(s).  Do not drive if you are taking narcotic pain medicine and make sure that your reaction time has returned.   4. You can shower as soon as the next day after surgery. Shower daily.  Use your regular soap and water (not directly on the incision) and pat your incision(s) dry  afterwards; don't rub.  No tub baths or submerging your body in water until cleared by your surgeon. If you have the soap that was given to you by pre-surgical testing that was used before surgery, you do not need to use it afterwards because this can irritate your incisions.   5. No sexual activity and nothing in the vagina for 8 weeks.  6. You may experience a small amount of clear drainage from your incisions, which is normal.  If the drainage persists, increases, or changes color please call the office.  7. Do not use creams, lotions, or ointments such as neosporin on your incisions after surgery until advised by your surgeon because they can cause removal of the dermabond glue on your incisions.    8. You may experience vaginal spotting after surgery or around the 6-8 week mark from surgery when the stitches at the top of the vagina begin to dissolve.  The spotting is normal but if you experience heavy bleeding, call our office.  9. Take Tylenol or ibuprofen first for pain and only use narcotic pain medication for severe pain not relieved by the Tylenol or Ibuprofen.  Monitor your Tylenol intake to a max of 4,000 mg in a 24 hour period. You can alternate these medications after surgery.  Diet: 1. Low sodium Heart Healthy Diet is recommended but you are cleared to resume your normal (before surgery) diet after your procedure.  2. It is safe to use a laxative, such as Miralax or Colace, if you have difficulty moving your bowels. You have been prescribed Sennakot at bedtime every evening to keep bowel movements regular and to prevent constipation.    Wound Care: 1. Keep clean and dry.  Shower daily.  Reasons to call the Doctor: Fever - Oral temperature greater than 100.4 degrees Fahrenheit Foul-smelling vaginal discharge Difficulty urinating Nausea and vomiting Increased pain at the site of the incision that is unrelieved with pain medicine. Difficulty breathing with or without chest  pain New calf pain especially if only on one side Sudden, continuing increased vaginal bleeding with or without clots.   Contacts: For questions or concerns you should contact:  Dr. Everitt Amber at [phone]  [name], NP at [phone]  After Hours: call [phone] and have the GYN Oncologist paged/contacted (after 5 pm or on the weekends).  Messages sent via mychart  are for non-urgent matters and are not responded to after hours so for urgent needs, please call the after hours number.

## 2020-12-26 NOTE — H&P (View-Only) (Signed)
Consult Note: Gyn-Onc  Consult was requested by Dr. McDiarmid for the evaluation of Becky Fields 75 y.o. female  CC:  Chief Complaint  Patient presents with   pelvic mass   Urinary Retention    Assessment/Plan:  Becky Fields  is a 75 y.o.  year old with a 12cm cystic pelvis mass causing urinary retention.  The mass is associated with a normal CA 125.   I reviewed the CT images personally. While the cystic mass is somewhat irregular with increased vascularity within the solid areas, it is mostly cystic and without findings of obvious metastatic disease. Additionally, a normal tumor marker is reassuring.  I am recommending robotic assisted total hysterectomy with BSO, frozen section, and possible staging. We will place the ovary in an endocatch bag and drain/decompress vaginally prior to removal.   I explained the procedure, its risks including  bleeding, infection, damage to internal organs (such as bladder,ureters, bowels), blood clot, reoperation and rehospitalization.  I explained the possibility of staging procedures being necessary.    HPI: [name] is a 75 year old P2 who was seen in consultation at the request of Dr Matilde Sprang for evaluation of a posterior pelvic mass likely arising from an ovary that was associated with urinary retention.  The patient reported acute onset urinary retention and increasing pelvic plush pressure on December 18, 2020.  She presented to the emergency department with a symptom and a Foley was placed.  A CT scan was performed on 12/18/2020 which revealed a 12 cm complex cystic mass posterior to the uterus with a decompressed bladder.  There was no lymphadenopathy, no hydronephrosis.  Dr. Matilde Sprang consulted from urology and felt that the cystic mass likely represented adnexal mass and recommended gynecologic oncology consultation.  A Ca1 25 was drawn on 12/24/20 which was normal at 16.8.   The patient returned to the ED for discomfort on 12/20/20 and  a pelvic US was performed which demonstrated a 6.6 x 1.7 x 2.8 cm uterus with a large complex cystic mass with thickened septations containing flow measuring 12.8 x 8 x 10.9 cm posterior to the uterus.  Her medical history is most significant for diabetes mellitus (most recent HbA1c was 6.5 in June, 2022).  Her surgical history is most significant for a laparoscopic cholecystectomy.  Her gynecologic history is remarkable for SVD x 2.  Her family cancer history is unremarkable.  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   Current Meds:  Outpatient Encounter Medications as of 12/26/2020  Medication Sig   losartan (COZAAR) 100 MG tablet Take 1 tablet by mouth once daily (Patient taking differently: Take 100 mg by mouth every evening.)   metFORMIN (GLUCOPHAGE-XR) 500 MG 24 hr tablet TAKE 2 TABLETS BY MOUTH ONCE DAILY WITH BREAKFAST (Patient taking differently: Take 1,000 mg by mouth daily with breakfast.)   metoprolol succinate (TOPROL-XL) 50 MG 24 hr tablet TAKE 1 TABLET BY MOUTH AT BEDTIME WITH A MEAL OR  IMMEDIATELY  FOLLOWING  A  MEAL (Patient taking differently: Take 50 mg by mouth every evening.)   pravastatin (PRAVACHOL) 40 MG tablet Take 1 tablet by mouth once daily (Patient taking differently: Take 40 mg by mouth daily.)   albuterol (VENTOLIN HFA) 108 (90 Base) MCG/ACT inhaler Inhale 2 puffs into the lungs every 4 (four) hours as needed for wheezing or shortness of breath. (Patient not taking: Reported on 12/25/2020)   Polyethyl Glycol-Propyl Glycol (SYSTANE OP) Apply 1 drop to eye  2 (two) times daily as needed (dry eyes). Reported on 09/05/2015 (Patient not taking: Reported on 12/25/2020)   No facility-administered encounter medications on file as of 12/26/2020.    Allergy:  Allergies  Allergen Reactions   Lisinopril     REACTION: causes cough   Sulfa Antibiotics     Told as a child allergic    Social Hx:   Social History   Socioeconomic History   Marital status:  Widowed    Spouse name: Ron   Number of children: 2   Years of education: Bachelors   Highest education level: Not on file  Occupational History   Occupation: Surveyor, minerals    Employer: [employer]  Tobacco Use   Smoking status: Never   Smokeless tobacco: Never  Vaping Use   Vaping Use: Never used  Substance and Sexual Activity   Alcohol use: Yes    Alcohol/week: 6.0 standard drinks    Types: 6 Standard drinks or equivalent per week    Comment: Occasional glass of wine on the weekends   Drug use: No   Sexual activity: Not Currently  Other Topics Concern   Not on file  Social History Narrative   Health Care POA:    Emergency Contact: husband, Alois Mincer (c) [phone]   End of Life Plan:    Who lives with you: husband, Ron, occasionally son who works out of town   Any pets: 2 dogs, 2 cats   Diet: Pt has a varied diet of protein, starch, vegetables.  Pt is not currently on any kind of diet modifications for DM control.   Exercise: Pt has not regular exercise routine.   Seatbelts: Pt reports wearing seatbelt when in vehicles.    Sun Exposure/Protection: Pt reports not wearing sun protection.   Hobbies: Spending time with granddaughter, reading         Social Determinants of Health   Financial Resource Strain: Not on file  Food Insecurity: Not on file  Transportation Needs: Not on file  Physical Activity: Not on file  Stress: Not on file  Social Connections: Not on file  Intimate Partner Violence: Not on file    Past Surgical Hx:  Past Surgical History:  Procedure Laterality Date   CHOLECYSTECTOMY     COLONOSCOPY     EYE SURGERY  02/2012   bilateral cataract surg   LEFT HEART CATHETERIZATION WITH CORONARY ANGIOGRAM N/A 10/17/2012   Procedure: LEFT HEART CATHETERIZATION WITH CORONARY ANGIOGRAM;  Surgeon: Sherren Mocha, MD;  Location: [facility] CATH LAB;  Service: Cardiovascular;  Laterality: N/A;    Past Medical Hx:  Past Medical History:  Diagnosis Date   Cataract     Diabetes mellitus    Ejection fraction    .   Hepatitis C    Hypertension    Hypertriglyceridemia    Takotsubo syndrome    October 16, 2012    Past Gynecological History:  SVD x 2 No LMP recorded. Patient is postmenopausal.  Family Hx:  Family History  Problem Relation Age of Onset   Hypertension Mother    Aneurysm Father    Hypertension Sister    CAD Brother 57   Heart attack Neg Hx    Stroke Neg Hx    Colon cancer Neg Hx    Breast cancer Neg Hx    Ovarian cancer Neg Hx    Endometrial cancer Neg Hx    Pancreatic cancer Neg Hx    Prostate cancer Neg Hx     Review of  Systems:  Constitutional  Feels well,    ENT Normal appearing ears and nares bilaterally Skin/Breast  No rash, sores, jaundice, itching, dryness Cardiovascular  No chest pain, shortness of breath, or edema  Pulmonary  No cough or wheeze.  Gastro Intestinal  No nausea, vomitting, or diarrhoea. No bright red blood per rectum, no abdominal pain, change in bowel movement, or constipation.  Genito Urinary  No frequency, urgency, dysuria, + urinary retention Musculo Skeletal  No myalgia, arthralgia, joint swelling or pain  Neurologic  No weakness, numbness, change in gait,  Psychology  No depression, anxiety, insomnia.   Vitals:  Blood pressure 108/81, pulse 74, temperature 98.7 F (37.1 C), temperature source Oral, resp. rate 20, height 5\' 2"  (1.575 m), weight 147 lb (66.7 kg), SpO2 99 %.  Physical Exam: WD in NAD Neck  Supple NROM, without any enlargements.  Lymph Node Survey No cervical supraclavicular or inguinal adenopathy Cardiovascular  Well perfused peripheries Lungs  No increased WOB Skin  No rash/lesions/breakdown  Psychiatry  Alert and oriented to person, place, and time  Abdomen  Normoactive bowel sounds, abdomen soft, non-tender and nonobese (BMI 26) without evidence of hernia.  Back No CVA tenderness Genito Urinary  Vulva/vagina: No lesions or masses  Bladder/urethra:  inflamed urethra from foley trauma    Vagina: normal  Cervix: Normal appearing, no lesions.  Uterus:  Small, mobile, no parametrial involvement or nodularity.  Adnexa: cystic, smooth, tense, immobile posterior cul de sac mass, separate from uterus (displaced anteriorly) Rectal  Smooth cystic immobile posterior mass, no obvious rectal infiltration. Extremities  No bilateral cyanosis, clubbing or edema.  60 minutes of total time was spent for this patient encounter, including preparation, face-to-face counseling with the patient and coordination of care, review of imaging (results and images), communication with the referring provider and documentation of the encounter.   Thereasa Solo, MD  12/26/2020, 12:15 PM

## 2020-12-26 NOTE — Progress Notes (Signed)
Consult Note: Gyn-Onc  Consult was requested by Dr. McDiarmid for the evaluation of Becky Fields 75 y.o. female  CC:  Chief Complaint  Patient presents with   pelvic mass   Urinary Retention    Assessment/Plan:  Ms. Becky Fields  is a 75 y.o.  year old with a 12cm cystic pelvis mass causing urinary retention.  The mass is associated with a normal CA 125.   I reviewed the CT images personally. While the cystic mass is somewhat irregular with increased vascularity within the solid areas, it is mostly cystic and without findings of obvious metastatic disease. Additionally, a normal tumor marker is reassuring.  I am recommending robotic assisted total hysterectomy with BSO, frozen section, and possible staging. We will place the ovary in an endocatch bag and drain/decompress vaginally prior to removal.   I explained the procedure, its risks including  bleeding, infection, damage to internal organs (such as bladder,ureters, bowels), blood clot, reoperation and rehospitalization.  I explained the possibility of staging procedures being necessary.    HPI: [name] is a 75 year old P2 who was seen in consultation at the request of Dr Becky Fields for evaluation of a posterior pelvic mass likely arising from an ovary that was associated with urinary retention.  The patient reported acute onset urinary retention and increasing pelvic plush pressure on December 18, 2020.  She presented to the emergency department with a symptom and a Foley was placed.  A CT scan was performed on 12/18/2020 which revealed a 12 cm complex cystic mass posterior to the uterus with a decompressed bladder.  There was no lymphadenopathy, no hydronephrosis.  Dr. Matilde Fields consulted from urology and felt that the cystic mass likely represented adnexal mass and recommended gynecologic oncology consultation.  A Ca1 25 was drawn on 12/24/20 which was normal at 16.8.   The patient returned to the ED for discomfort on 12/20/20 and  a pelvic US was performed which demonstrated a 6.6 x 1.7 x 2.8 cm uterus with a large complex cystic mass with thickened septations containing flow measuring 12.8 x 8 x 10.9 cm posterior to the uterus.  Her medical history is most significant for diabetes mellitus (most recent HbA1c was 6.5 in June, 2022).  Her surgical history is most significant for a laparoscopic cholecystectomy.  Her gynecologic history is remarkable for SVD x 2.  Her family cancer history is unremarkable.  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   Current Meds:  Outpatient Encounter Medications as of 12/26/2020  Medication Sig   losartan (COZAAR) 100 MG tablet Take 1 tablet by mouth once daily (Patient taking differently: Take 100 mg by mouth every evening.)   metFORMIN (GLUCOPHAGE-XR) 500 MG 24 hr tablet TAKE 2 TABLETS BY MOUTH ONCE DAILY WITH BREAKFAST (Patient taking differently: Take 1,000 mg by mouth daily with breakfast.)   metoprolol succinate (TOPROL-XL) 50 MG 24 hr tablet TAKE 1 TABLET BY MOUTH AT BEDTIME WITH A MEAL OR  IMMEDIATELY  FOLLOWING  A  MEAL (Patient taking differently: Take 50 mg by mouth every evening.)   pravastatin (PRAVACHOL) 40 MG tablet Take 1 tablet by mouth once daily (Patient taking differently: Take 40 mg by mouth daily.)   albuterol (VENTOLIN HFA) 108 (90 Base) MCG/ACT inhaler Inhale 2 puffs into the lungs every 4 (four) hours as needed for wheezing or shortness of breath. (Patient not taking: Reported on 12/25/2020)   Polyethyl Glycol-Propyl Glycol (SYSTANE OP) Apply 1 drop to eye  2 (two) times daily as needed (dry eyes). Reported on 09/05/2015 (Patient not taking: Reported on 12/25/2020)   No facility-administered encounter medications on file as of 12/26/2020.    Allergy:  Allergies  Allergen Reactions   Lisinopril     REACTION: causes cough   Sulfa Antibiotics     Told as a child allergic    Social Hx:   Social History   Socioeconomic History   Marital status:  Widowed    Spouse name: Becky Fields   Number of children: 2   Years of education: Bachelors   Highest education level: Not on file  Occupational History   Occupation: Surveyor, minerals    Employer: [employer]  Tobacco Use   Smoking status: Never   Smokeless tobacco: Never  Vaping Use   Vaping Use: Never used  Substance and Sexual Activity   Alcohol use: Yes    Alcohol/week: 6.0 standard drinks    Types: 6 Standard drinks or equivalent per week    Comment: Occasional glass of wine on the weekends   Drug use: No   Sexual activity: Not Currently  Other Topics Concern   Not on file  Social History Narrative   Health Care POA:    Emergency Contact: husband, Becky Fields (c) [phone]   End of Life Plan:    Who lives with you: husband, Becky Fields, occasionally son who works out of town   Any pets: 2 dogs, 2 cats   Diet: Pt has a varied diet of protein, starch, vegetables.  Pt is not currently on any kind of diet modifications for DM control.   Exercise: Pt has not regular exercise routine.   Seatbelts: Pt reports wearing seatbelt when in vehicles.    Sun Exposure/Protection: Pt reports not wearing sun protection.   Hobbies: Spending time with granddaughter, reading         Social Determinants of Health   Financial Resource Strain: Not on file  Food Insecurity: Not on file  Transportation Needs: Not on file  Physical Activity: Not on file  Stress: Not on file  Social Connections: Not on file  Intimate Partner Violence: Not on file    Past Surgical Hx:  Past Surgical History:  Procedure Laterality Date   CHOLECYSTECTOMY     COLONOSCOPY     EYE SURGERY  02/2012   bilateral cataract surg   LEFT HEART CATHETERIZATION WITH CORONARY ANGIOGRAM N/A 10/17/2012   Procedure: LEFT HEART CATHETERIZATION WITH CORONARY ANGIOGRAM;  Surgeon: Sherren Mocha, MD;  Location: [facility] CATH LAB;  Service: Cardiovascular;  Laterality: N/A;    Past Medical Hx:  Past Medical History:  Diagnosis Date   Cataract     Diabetes mellitus    Ejection fraction    .   Hepatitis C    Hypertension    Hypertriglyceridemia    Takotsubo syndrome    October 16, 2012    Past Gynecological History:  SVD x 2 No LMP recorded. Patient is postmenopausal.  Family Hx:  Family History  Problem Relation Age of Onset   Hypertension Mother    Aneurysm Father    Hypertension Sister    CAD Brother 57   Heart attack Neg Hx    Stroke Neg Hx    Colon cancer Neg Hx    Breast cancer Neg Hx    Ovarian cancer Neg Hx    Endometrial cancer Neg Hx    Pancreatic cancer Neg Hx    Prostate cancer Neg Hx     Review of  Systems:  Constitutional  Feels well,    ENT Normal appearing ears and nares bilaterally Skin/Breast  No rash, sores, jaundice, itching, dryness Cardiovascular  No chest pain, shortness of breath, or edema  Pulmonary  No cough or wheeze.  Gastro Intestinal  No nausea, vomitting, or diarrhoea. No bright red blood per rectum, no abdominal pain, change in bowel movement, or constipation.  Genito Urinary  No frequency, urgency, dysuria, + urinary retention Musculo Skeletal  No myalgia, arthralgia, joint swelling or pain  Neurologic  No weakness, numbness, change in gait,  Psychology  No depression, anxiety, insomnia.   Vitals:  Blood pressure 108/81, pulse 74, temperature 98.7 F (37.1 C), temperature source Oral, resp. rate 20, height 5\' 2"  (1.575 m), weight 147 lb (66.7 kg), SpO2 99 %.  Physical Exam: WD in NAD Neck  Supple NROM, without any enlargements.  Lymph Node Survey No cervical supraclavicular or inguinal adenopathy Cardiovascular  Well perfused peripheries Lungs  No increased WOB Skin  No rash/lesions/breakdown  Psychiatry  Alert and oriented to person, place, and time  Abdomen  Normoactive bowel sounds, abdomen soft, non-tender and nonobese (BMI 26) without evidence of hernia.  Back No CVA tenderness Genito Urinary  Vulva/vagina: No lesions or masses  Bladder/urethra:  inflamed urethra from foley trauma    Vagina: normal  Cervix: Normal appearing, no lesions.  Uterus:  Small, mobile, no parametrial involvement or nodularity.  Adnexa: cystic, smooth, tense, immobile posterior cul de sac mass, separate from uterus (displaced anteriorly) Rectal  Smooth cystic immobile posterior mass, no obvious rectal infiltration. Extremities  No bilateral cyanosis, clubbing or edema.  60 minutes of total time was spent for this patient encounter, including preparation, face-to-face counseling with the patient and coordination of care, review of imaging (results and images), communication with the referring provider and documentation of the encounter.   Thereasa Solo, MD  12/26/2020, 12:15 PM

## 2020-12-27 ENCOUNTER — Telehealth: Payer: Self-pay

## 2020-12-27 NOTE — Patient Instructions (Signed)
DUE TO COVID-19 ONLY ONE VISITOR IS ALLOWED TO COME WITH YOU AND STAY IN THE WAITING ROOM ONLY DURING PRE OP AND PROCEDURE DAY OF SURGERY. THE 1 VISITOR  MAY VISIT WITH YOU AFTER SURGERY IN YOUR PRIVATE ROOM DURING VISITING HOURS ONLY!               Becky Fields   Your procedure is scheduled on: 01/02/21   Report to [facility] Main  Entrance   Report to admitting at : 12:00 PM     Call this number if you have problems the morning of surgery [phone]    Remember: Eat a light diet the day before surgery.  Examples including soups, broths, toast, yogurt, mashed potatoes.  Things to avoid include carbonated beverages (fizzy beverages), raw fruits and raw vegetables, or beans.   If your bowels are filled with gas, your surgeon will have difficulty visualizing your pelvic organs which increases your surgical risks.  NO SOLID FOOD AFTER MIDNIGHT THE NIGHT PRIOR TO SURGERY. NOTHING BY MOUTH EXCEPT CLEAR LIQUIDS UNTIL: 11:00 AM .   CLEAR LIQUID DIET  Foods Allowed                                                                     Foods Excluded  Coffee and tea, regular and decaf                             liquids that you cannot  Plain Jell-O any favor except red or purple                                           see through such as: Fruit ices (not with fruit pulp)                                     milk, soups, orange juice  Iced Popsicles                                    All solid food Carbonated beverages, regular and diet                                    Cranberry, grape and apple juices Sports drinks like Gatorade Lightly seasoned clear broth or consume(fat free) Sugar, honey syrup  Sample Menu Breakfast                                Lunch                                     Supper Cranberry juice                    Beef broth  Chicken broth Jell-O                                     Grape juice                           Apple  juice Coffee or tea                        Jell-O                                      Popsicle                                                Coffee or tea                        Coffee or tea  _____________________________________________________________________   BRUSH YOUR TEETH MORNING OF SURGERY AND RINSE YOUR MOUTH OUT, NO CHEWING GUM CANDY OR MINTS.    Take these medicines the morning of surgery with A SIP OF WATER: metoprolol.Use inhalers as usual.  How to Manage Your Diabetes Before and After Surgery  Why is it important to control my blood sugar before and after surgery? Improving blood sugar levels before and after surgery helps healing and can limit problems. A way of improving blood sugar control is eating a healthy diet by:  Eating less sugar and carbohydrates  Increasing activity/exercise  Talking with your doctor about reaching your blood sugar goals High blood sugars (greater than 180 mg/dL) can raise your risk of infections and slow your recovery, so you will need to focus on controlling your diabetes during the weeks before surgery. Make sure that the doctor who takes care of your diabetes knows about your planned surgery including the date and location.  How do I manage my blood sugar before surgery? Check your blood sugar at least 4 times a day, starting 2 days before surgery, to make sure that the level is not too high or low. Check your blood sugar the morning of your surgery when you wake up and every 2 hours until you get to the Short Stay unit. If your blood sugar is less than 70 mg/dL, you will need to treat for low blood sugar: Do not take insulin. Treat a low blood sugar (less than 70 mg/dL) with  cup of clear juice (cranberry or apple), 4 glucose tablets, OR glucose gel. Recheck blood sugar in 15 minutes after treatment (to make sure it is greater than 70 mg/dL). If your blood sugar is not greater than 70 mg/dL on recheck, call [phone] for further  instructions. Report your blood sugar to the short stay nurse when you get to Short Stay.  If you are admitted to the hospital after surgery: Your blood sugar will be checked by the staff and you will probably be given insulin after surgery (instead of oral diabetes medicines) to make sure you have good blood sugar levels. The goal for blood sugar control after surgery is 80-180 mg/dL.   WHAT DO I DO ABOUT MY DIABETES MEDICATION?  Do not take  oral diabetes medicines (pills) the morning of surgery.  THE DAY BEFORE SURGERY, take Metformin as usual.       THE MORNING OF SURGERY, DO NOT TAKE ANY DIABETIC MEDICATIONS DAY OF YOUR SURGERY                               You may not have any metal on your body including hair pins and              piercings  Do not wear jewelry, make-up, lotions, powders or perfumes, deodorant             Do not wear nail polish on your fingernails.  Do not shave  48 hours prior to surgery.    Do not bring valuables to the hospital. [place].  Contacts, dentures or bridgework may not be worn into surgery.  Leave suitcase in the car. After surgery it may be brought to your room.     Patients discharged the day of surgery will not be allowed to drive home. IF YOU ARE HAVING SURGERY AND GOING HOME THE SAME DAY, YOU MUST HAVE AN ADULT TO DRIVE YOU HOME AND BE WITH YOU FOR 24 HOURS. YOU MAY GO HOME BY TAXI OR UBER OR ORTHERWISE, BUT AN ADULT MUST ACCOMPANY YOU HOME AND STAY WITH YOU FOR 24 HOURS.  Name and phone number of your driver:  Special Instructions: N/A              Please read over the following fact sheets you were given: _____________________________________________________________________           [facility] - Preparing for Surgery Before surgery, you can play an important role.  Because skin is not sterile, your skin needs to be as free of germs as possible.  You can reduce the number of germs on  your skin by washing with CHG (chlorahexidine gluconate) soap before surgery.  CHG is an antiseptic cleaner which kills germs and bonds with the skin to continue killing germs even after washing. Please DO NOT use if you have an allergy to CHG or antibacterial soaps.  If your skin becomes reddened/irritated stop using the CHG and inform your nurse when you arrive at Short Stay. Do not shave (including legs and underarms) for at least 48 hours prior to the first CHG shower.  You may shave your face/neck. Please follow these instructions carefully:  1.  Shower with CHG Soap the night before surgery and the  morning of Surgery.  2.  If you choose to wash your hair, wash your hair first as usual with your  normal  shampoo.  3.  After you shampoo, rinse your hair and body thoroughly to remove the  shampoo.                           4.  Use CHG as you would any other liquid soap.  You can apply chg directly  to the skin and wash                       Gently with a scrungie or clean washcloth.  5.  Apply the CHG Soap to your body ONLY FROM THE NECK DOWN.   Do not use on face/ open  Wound or open sores. Avoid contact with eyes, ears mouth and genitals (private parts).                       Wash face,  Genitals (private parts) with your normal soap.             6.  Wash thoroughly, paying special attention to the area where your surgery  will be performed.  7.  Thoroughly rinse your body with warm water from the neck down.  8.  DO NOT shower/wash with your normal soap after using and rinsing off  the CHG Soap.                9.  Pat yourself dry with a clean towel.            10.  Wear clean pajamas.            11.  Place clean sheets on your bed the night of your first shower and do not  sleep with pets. Day of Surgery : Do not apply any lotions/deodorants the morning of surgery.  Please wear clean clothes to the hospital/surgery center.  FAILURE TO FOLLOW THESE INSTRUCTIONS MAY  RESULT IN THE CANCELLATION OF YOUR SURGERY PATIENT SIGNATURE_________________________________  NURSE SIGNATURE__________________________________  ________________________________________________________________________  WHAT IS A BLOOD TRANSFUSION? Blood Transfusion Information  A transfusion is the replacement of blood or some of its parts. Blood is made up of multiple cells which provide different functions. Red blood cells carry oxygen and are used for blood loss replacement. White blood cells fight against infection. Platelets control bleeding. Plasma helps clot blood. Other blood products are available for specialized needs, such as hemophilia or other clotting disorders. BEFORE THE TRANSFUSION  Who gives blood for transfusions?  Healthy volunteers who are fully evaluated to make sure their blood is safe. This is blood bank blood. Transfusion therapy is the safest it has ever been in the practice of medicine. Before blood is taken from a donor, a complete history is taken to make sure that person has no history of diseases nor engages in risky social behavior (examples are intravenous drug use or sexual activity with multiple partners). The donor's travel history is screened to minimize risk of transmitting infections, such as malaria. The donated blood is tested for signs of infectious diseases, such as HIV and hepatitis. The blood is then tested to be sure it is compatible with you in order to minimize the chance of a transfusion reaction. If you or a relative donates blood, this is often done in anticipation of surgery and is not appropriate for emergency situations. It takes many days to process the donated blood. RISKS AND COMPLICATIONS Although transfusion therapy is very safe and saves many lives, the main dangers of transfusion include:  Getting an infectious disease. Developing a transfusion reaction. This is an allergic reaction to something in the blood you were given. Every  precaution is taken to prevent this. The decision to have a blood transfusion has been considered carefully by your caregiver before blood is given. Blood is not given unless the benefits outweigh the risks. AFTER THE TRANSFUSION Right after receiving a blood transfusion, you will usually feel much better and more energetic. This is especially true if your red blood cells have gotten low (anemic). The transfusion raises the level of the red blood cells which carry oxygen, and this usually causes an energy increase. The nurse administering the transfusion will monitor you carefully for  complications. HOME CARE INSTRUCTIONS  No special instructions are needed after a transfusion. You may find your energy is better. Speak with your caregiver about any limitations on activity for underlying diseases you may have. SEEK MEDICAL CARE IF:  Your condition is not improving after your transfusion. You develop redness or irritation at the intravenous (IV) site. SEEK IMMEDIATE MEDICAL CARE IF:  Any of the following symptoms occur over the next 12 hours: Shaking chills. You have a temperature by mouth above 102 F (38.9 C), not controlled by medicine. Chest, back, or muscle pain. People around you feel you are not acting correctly or are confused. Shortness of breath or difficulty breathing. Dizziness and fainting. You get a rash or develop hives. You have a decrease in urine output. Your urine turns a dark color or changes to pink, red, or brown. Any of the following symptoms occur over the next 10 days: You have a temperature by mouth above 102 F (38.9 C), not controlled by medicine. Shortness of breath. Weakness after normal activity. The white part of the eye turns yellow (jaundice). You have a decrease in the amount of urine or are urinating less often. Your urine turns a dark color or changes to pink, red, or brown. Document Released: 06/05/2000 Document Revised: 08/31/2011 Document Reviewed:  01/23/2008 [facility] Patient Information 2014 [place], [place].  _______________________________________________________________________

## 2020-12-27 NOTE — Telephone Encounter (Signed)
Pt left message stating that she has been trying to call and cancel her appt and has not been able to get through.  I called pt and let her know we apologized for her not being able to get through however we will cancel her appt on 7/19 as she is being followed by another practice.  Pt stated thank you with no other questions.   Becky Fields  12/27/20

## 2020-12-27 NOTE — Progress Notes (Signed)
Patient here for consultation with Dr. Everitt Amber and for a pre-operative discussion prior to her scheduled surgery on January 02, 2021. She is scheduled for robotic assisted total laparoscopic hysterectomy (removal of the uterus and cervix), bilateral salpingo-oophorectomy (removal of both ovaries and fallopian tubes), possible staging. The surgery was discussed in detail.  See after visit summary for additional details. Visual aids used to discuss items related to surgery including sequential compression stockings, foley catheter, IV pump, multi-modal pain regimen including tylenol, photo of the surgical robot, female reproductive system to discuss surgery in detail.      Discussed post-op pain management in detail including the aspects of the enhanced recovery pathway.  Advised her that a new prescription would be sent in for tramadol and it is only to be used for after her upcoming surgery.  We discussed the use of tylenol post-op and to monitor for a maximum of 4,000 mg in a 24 hour period.  Also prescribed sennakot to be used after surgery and to hold if having loose stools.  Discussed bowel regimen in detail.     Discussed the use of SCDs and measures to take at home to prevent DVT including frequent mobility.  Reportable signs and symptoms of DVT discussed. Post-operative instructions discussed and expectations for after surgery. Incisional care discussed as well including reportable signs and symptoms including erythema, drainage, wound separation.     10 minutes spent with the patient.  Verbalizing understanding of material discussed. No needs or concerns voiced at the end of the visit.   Advised patient to call for any needs.  Advised that her post-operative medications had been prescribed and could be picked up at any time.    This appointment is included in the global surgical fee as pre-operative discussion and has no charge.

## 2020-12-27 NOTE — Patient Instructions (Signed)
Preparing for your Surgery   Plan for surgery on January 02, 2021 with Dr. Everitt Amber at [facility] will be scheduled for a robotic assisted total laparoscopic hysterectomy (removal of the uterus and cervix), bilateral salpingo-oophorectomy (removal of both ovaries and fallopian tubes), possible staging.     Pre-operative Testing -You will receive a phone call from presurgical testing at [facility] to arrange for a pre-operative appointment and lab work.   -Bring your insurance card, copy of an advanced directive if applicable, medication list   -At that visit, you will be asked to sign a consent for a possible blood transfusion in case a transfusion becomes necessary during surgery.  The need for a blood transfusion is rare but having consent is a necessary part of your care.      -You should not be taking blood thinners or aspirin at least ten days prior to surgery unless instructed by your surgeon.   -Do not take supplements such as fish oil (omega 3), red yeast rice, turmeric before your surgery. You want to avoid medications with aspirin in them including headache powders such as BC or Goody's), Excedrin migraine.   Day Before Surgery at [facility] will be asked to take in a light diet the day before surgery. You will be advised you can have clear liquids up until 3 hours before your surgery.     Eat a light diet the day before surgery.  Examples including soups, broths, toast, yogurt, mashed potatoes.  AVOID GAS PRODUCING FOODS. Things to avoid include carbonated beverages (fizzy beverages, sodas), raw fruits and raw vegetables (uncooked), or beans.   If your bowels are filled with gas, your surgeon will have difficulty visualizing your pelvic organs which increases your surgical risks.   Your role in recovery Your role is to become active as soon as directed by your doctor, while still giving yourself time to heal.  Rest when you feel tired. You will be asked to do  the following in order to speed your recovery:   - Cough and breathe deeply. This helps to clear and expand your lungs and can prevent pneumonia after surgery. - Simpson. Do mild physical activity. Walking or moving your legs help your circulation and body functions return to normal. Do not try to get up or walk alone the first time after surgery.   -If you develop swelling on one leg or the other, pain in the back of your leg, redness/warmth in one of your legs, please call the office or go to the Emergency Room to have a doppler to rule out a blood clot. For shortness of breath, chest pain-seek care in the Emergency Room as soon as possible. - Actively manage your pain. Managing your pain lets you move in comfort. We will ask you to rate your pain on a scale of zero to 10. It is your responsibility to tell your doctor or nurse where and how much you hurt so your pain can be treated.   Special Considerations -If you are diabetic, you may be placed on insulin after surgery to have closer control over your blood sugars to promote healing and recovery.  This does not mean that you will be discharged on insulin.  If applicable, your oral antidiabetics will be resumed when you are tolerating a solid diet.   -Your final pathology results from surgery should be available around one week after surgery and the results will be relayed  to you when available.   -FMLA forms can be faxed to [phone] and please allow 5-7 business days for completion.   Pain Management After Surgery -You have been prescribed your pain medication and bowel regimen medications before surgery so that you can have these available when you are discharged from the hospital. The pain medication is for use ONLY AFTER surgery and a new prescription will not be given.   -Make sure that you have Tylenol and Ibuprofen at home to use on a regular basis after surgery for pain control. We recommend alternating the  medications every hour to six hours since they work differently and are processed in the body differently for pain relief.   -Review the attached handout on narcotic use and their risks and side effects.   Bowel Regimen -You have been prescribed Sennakot-S to take nightly to prevent constipation especially if you are taking the narcotic pain medication intermittently.  It is important to prevent constipation and drink adequate amounts of liquids. You can stop taking this medication when you are not taking pain medication and you are back on your normal bowel routine.   Risks of Surgery Risks of surgery are low but include bleeding, infection, damage to surrounding structures, re-operation, blood clots, and very rarely death.     Blood Transfusion Information (For the consent to be signed before surgery)   We will be checking your blood type before surgery so in case of emergencies, we will know what type of blood you would need.                                             WHAT IS A BLOOD TRANSFUSION?   A transfusion is the replacement of blood or some of its parts. Blood is made up of multiple cells which provide different functions. Red blood cells carry oxygen and are used for blood loss replacement. White blood cells fight against infection. Platelets control bleeding. Plasma helps clot blood. Other blood products are available for specialized needs, such as hemophilia or other clotting disorders. BEFORE THE TRANSFUSION Who gives blood for transfusions? You may be able to donate blood to be used at a later date on yourself (autologous donation). Relatives can be asked to donate blood. This is generally not any safer than if you have received blood from a stranger. The same precautions are taken to ensure safety when a relative's blood is donated. Healthy volunteers who are fully evaluated to make sure their blood is safe. This is blood bank blood. Transfusion therapy is the safest it  has ever been in the practice of medicine. Before blood is taken from a donor, a complete history is taken to make sure that person has no history of diseases nor engages in risky social behavior (examples are intravenous drug use or sexual activity with multiple partners). The donor's travel history is screened to minimize risk of transmitting infections, such as malaria. The donated blood is tested for signs of infectious diseases, such as HIV and hepatitis. The blood is then tested to be sure it is compatible with you in order to minimize the chance of a transfusion reaction. If you or a relative donates blood, this is often done in anticipation of surgery and is not appropriate for emergency situations. It takes many days to process the donated blood. RISKS AND COMPLICATIONS Although transfusion therapy is very  safe and saves many lives, the main dangers of transfusion include: Getting an infectious disease. Developing a transfusion reaction. This is an allergic reaction to something in the blood you were given. Every precaution is taken to prevent this. The decision to have a blood transfusion has been considered carefully by your caregiver before blood is given. Blood is not given unless the benefits outweigh the risks.   AFTER SURGERY INSTRUCTIONS   Return to work: 4 weeks if applicable   Activity: 1. Be up and out of the bed during the day.  Take a nap if needed.  You may walk up steps but be careful and use the hand rail.  Stair climbing will tire you more than you think, you may need to stop part way and rest.   2. No lifting or straining for 6 weeks over 10 pounds. No pushing, pulling, straining for 6 weeks.   3. No driving for 1 week(s).  Do not drive if you are taking narcotic pain medicine and make sure that your reaction time has returned.   4. You can shower as soon as the next day after surgery. Shower daily.  Use your regular soap and water (not directly on the incision) and pat  your incision(s) dry afterwards; don't rub.  No tub baths or submerging your body in water until cleared by your surgeon. If you have the soap that was given to you by pre-surgical testing that was used before surgery, you do not need to use it afterwards because this can irritate your incisions.   5. No sexual activity and nothing in the vagina for 8 weeks.   6. You may experience a small amount of clear drainage from your incisions, which is normal.  If the drainage persists, increases, or changes color please call the office.   7. Do not use creams, lotions, or ointments such as neosporin on your incisions after surgery until advised by your surgeon because they can cause removal of the dermabond glue on your incisions.     8. You may experience vaginal spotting after surgery or around the 6-8 week mark from surgery when the stitches at the top of the vagina begin to dissolve.  The spotting is normal but if you experience heavy bleeding, call our office.   9. Take Tylenol or ibuprofen first for pain and only use narcotic pain medication for severe pain not relieved by the Tylenol or Ibuprofen.  Monitor your Tylenol intake to a max of 4,000 mg in a 24 hour period. You can alternate these medications after surgery.   Diet: 1. Low sodium Heart Healthy Diet is recommended but you are cleared to resume your normal (before surgery) diet after your procedure.   2. It is safe to use a laxative, such as Miralax or Colace, if you have difficulty moving your bowels. You have been prescribed Sennakot at bedtime every evening to keep bowel movements regular and to prevent constipation.     Wound Care: 1. Keep clean and dry.  Shower daily.   Reasons to call the Doctor: Fever - Oral temperature greater than 100.4 degrees Fahrenheit Foul-smelling vaginal discharge Difficulty urinating Nausea and vomiting Increased pain at the site of the incision that is unrelieved with pain medicine. Difficulty  breathing with or without chest pain New calf pain especially if only on one side Sudden, continuing increased vaginal bleeding with or without clots.   Contacts: For questions or concerns you should contact:   Dr. Everitt Amber at  [place], [place] at [phone]   After Hours: call [phone] and have the GYN Oncologist paged/contacted (after 5 pm or on the weekends).   Messages sent via mychart are for non-urgent matters and are not responded to after hours so for urgent needs, please call the after hours number.

## 2020-12-30 ENCOUNTER — Encounter (HOSPITAL_COMMUNITY): Payer: Self-pay

## 2020-12-30 ENCOUNTER — Other Ambulatory Visit: Payer: Self-pay

## 2020-12-30 ENCOUNTER — Encounter (HOSPITAL_COMMUNITY)
Admission: RE | Admit: 2020-12-30 | Discharge: 2020-12-30 | Disposition: A | Discharge status: Home / Self Care | Payer: Medicare HMO | Source: Ambulatory Visit | Attending: Gynecologic Oncology | Admitting: Gynecologic Oncology

## 2020-12-30 DIAGNOSIS — Z7984 Long term (current) use of oral hypoglycemic drugs: Secondary | ICD-10-CM | POA: Diagnosis not present

## 2020-12-30 DIAGNOSIS — E119 Type 2 diabetes mellitus without complications: Secondary | ICD-10-CM | POA: Diagnosis not present

## 2020-12-30 DIAGNOSIS — Z79899 Other long term (current) drug therapy: Secondary | ICD-10-CM | POA: Diagnosis not present

## 2020-12-30 DIAGNOSIS — Z882 Allergy status to sulfonamides status: Secondary | ICD-10-CM | POA: Diagnosis not present

## 2020-12-30 DIAGNOSIS — Z01818 Encounter for other preprocedural examination: Secondary | ICD-10-CM | POA: Insufficient documentation

## 2020-12-30 DIAGNOSIS — N838 Other noninflammatory disorders of ovary, fallopian tube and broad ligament: Secondary | ICD-10-CM | POA: Diagnosis not present

## 2020-12-30 DIAGNOSIS — R339 Retention of urine, unspecified: Secondary | ICD-10-CM | POA: Diagnosis present

## 2020-12-30 DIAGNOSIS — D271 Benign neoplasm of left ovary: Secondary | ICD-10-CM | POA: Diagnosis not present

## 2020-12-30 DIAGNOSIS — D270 Benign neoplasm of right ovary: Secondary | ICD-10-CM | POA: Diagnosis not present

## 2020-12-30 DIAGNOSIS — N800 Endometriosis of uterus: Secondary | ICD-10-CM | POA: Diagnosis not present

## 2020-12-30 DIAGNOSIS — Z888 Allergy status to other drugs, medicaments and biological substances status: Secondary | ICD-10-CM | POA: Diagnosis not present

## 2020-12-30 HISTORY — DX: Pneumonia, unspecified organism: J18.9

## 2020-12-30 LAB — URINALYSIS, ROUTINE W REFLEX MICROSCOPIC
Bacteria, UA: NONE SEEN
Bilirubin Urine: NEGATIVE
Glucose, UA: NEGATIVE mg/dL
Ketones, ur: NEGATIVE mg/dL
Nitrite: POSITIVE — AB
Protein, ur: 30 mg/dL — AB
Specific Gravity, Urine: 1.018 (ref 1.005–1.030)
pH: 5 (ref 5.0–8.0)

## 2020-12-30 LAB — CBC
HCT: 41 % (ref 36.0–46.0)
Hemoglobin: 13.8 g/dL (ref 12.0–15.0)
MCH: 31.4 pg (ref 26.0–34.0)
MCHC: 33.7 g/dL (ref 30.0–36.0)
MCV: 93.2 fL (ref 80.0–100.0)
Platelets: 202 10*3/uL (ref 150–400)
RBC: 4.4 MIL/uL (ref 3.87–5.11)
RDW: 11.9 % (ref 11.5–15.5)
WBC: 7.4 10*3/uL (ref 4.0–10.5)
nRBC: 0 % (ref 0.0–0.2)

## 2020-12-30 LAB — BASIC METABOLIC PANEL
Anion gap: 8 (ref 5–15)
BUN: 27 mg/dL — ABNORMAL HIGH (ref 8–23)
CO2: 24 mmol/L (ref 22–32)
Calcium: 9.4 mg/dL (ref 8.9–10.3)
Chloride: 105 mmol/L (ref 98–111)
Creatinine, Ser: 0.55 mg/dL (ref 0.44–1.00)
GFR, Estimated: >60 mL/min (ref >60)
Glucose, Bld: 122 mg/dL — ABNORMAL HIGH (ref 70–99)
Potassium: 4.4 mmol/L (ref 3.5–5.1)
Sodium: 137 mmol/L (ref 135–145)

## 2020-12-30 LAB — GLUCOSE, CAPILLARY: Glucose-Capillary: 101 mg/dL — ABNORMAL HIGH (ref 70–99)

## 2020-12-30 NOTE — Progress Notes (Signed)
COVID Vaccine Completed: Yes Date COVID Vaccine completed: 12/05/20 .2nd. [place] COVID vaccine manufacturer: Pfizer      PCP - Dr. Madison Hickman Cardiologist -   Chest x-ray -  EKG -  Stress Test -  ECHO - 07/04/15 Cardiac Cath - 2014 Pacemaker/ICD device last checked:  Sleep Study -  CPAP -   Fasting Blood Sugar - N/A Checks Blood Sugar ___0__ times a day  Blood Thinner Instructions: Aspirin Instructions: Last Dose:  Anesthesia review:   Patient denies shortness of breath, fever, cough and chest pain at PAT appointment   Patient verbalized understanding of instructions that were given to them at the PAT appointment. Patient was also instructed that they will need to review over the PAT instructions again at home before surgery.

## 2021-01-01 ENCOUNTER — Other Ambulatory Visit: Payer: Self-pay | Admitting: Gynecologic Oncology

## 2021-01-01 ENCOUNTER — Telehealth: Payer: Self-pay

## 2021-01-01 DIAGNOSIS — N390 Urinary tract infection, site not specified: Secondary | ICD-10-CM

## 2021-01-01 DIAGNOSIS — T83511A Infection and inflammatory reaction due to indwelling urethral catheter, initial encounter: Secondary | ICD-10-CM

## 2021-01-01 MED ORDER — NITROFURANTOIN MONOHYD MACRO 100 MG PO CAPS: 100.0000 mg | ORAL_CAPSULE | Freq: Two times a day (BID) | ORAL | 0 refills | Status: DC

## 2021-01-01 NOTE — Progress Notes (Signed)
Pre-op urine culture with >=100,000 gram negative rods. Patient had catheter placed in the ED for urinary retention related to pelvic mass. Macrobid sent in per Dr. Denman George pre-operatively.

## 2021-01-01 NOTE — Telephone Encounter (Signed)
Told Becky Fields that she has a UTI from pr-op labs. Macrobid was sent in for this. She needs to take ATB bid today and then resume when she gets home tomorrow after surgery and finish prescription. She states that she understands her written pre-op instructions from 12-30-20 and does not have any questions or concerns at this time.

## 2021-01-02 ENCOUNTER — Ambulatory Visit (HOSPITAL_COMMUNITY): Payer: Medicare HMO | Admitting: Anesthesiology

## 2021-01-02 ENCOUNTER — Ambulatory Visit (HOSPITAL_COMMUNITY)
Admission: RE | Admit: 2021-01-02 | Discharge: 2021-01-02 | Disposition: A | Discharge status: Home / Self Care | Payer: Medicare HMO | Attending: Gynecologic Oncology | Admitting: Gynecologic Oncology

## 2021-01-02 ENCOUNTER — Telehealth: Payer: Self-pay | Admitting: Podiatry

## 2021-01-02 ENCOUNTER — Encounter (HOSPITAL_COMMUNITY): Payer: Self-pay | Admitting: Gynecologic Oncology

## 2021-01-02 ENCOUNTER — Encounter (HOSPITAL_COMMUNITY)
Admission: RE | Disposition: A | Discharge status: Home / Self Care | Payer: Self-pay | Source: Home / Self Care | Attending: Gynecologic Oncology

## 2021-01-02 DIAGNOSIS — I340 Nonrheumatic mitral (valve) insufficiency: Secondary | ICD-10-CM | POA: Diagnosis not present

## 2021-01-02 DIAGNOSIS — N838 Other noninflammatory disorders of ovary, fallopian tube and broad ligament: Secondary | ICD-10-CM | POA: Insufficient documentation

## 2021-01-02 DIAGNOSIS — D271 Benign neoplasm of left ovary: Secondary | ICD-10-CM | POA: Diagnosis not present

## 2021-01-02 DIAGNOSIS — R339 Retention of urine, unspecified: Secondary | ICD-10-CM

## 2021-01-02 DIAGNOSIS — D270 Benign neoplasm of right ovary: Secondary | ICD-10-CM | POA: Insufficient documentation

## 2021-01-02 DIAGNOSIS — E119 Type 2 diabetes mellitus without complications: Secondary | ICD-10-CM | POA: Diagnosis not present

## 2021-01-02 DIAGNOSIS — N800 Endometriosis of uterus: Secondary | ICD-10-CM | POA: Diagnosis not present

## 2021-01-02 DIAGNOSIS — Z79899 Other long term (current) drug therapy: Secondary | ICD-10-CM | POA: Diagnosis not present

## 2021-01-02 DIAGNOSIS — E1142 Type 2 diabetes mellitus with diabetic polyneuropathy: Secondary | ICD-10-CM | POA: Diagnosis not present

## 2021-01-02 DIAGNOSIS — R1900 Intra-abdominal and pelvic swelling, mass and lump, unspecified site: Secondary | ICD-10-CM

## 2021-01-02 DIAGNOSIS — Z7984 Long term (current) use of oral hypoglycemic drugs: Secondary | ICD-10-CM | POA: Diagnosis not present

## 2021-01-02 DIAGNOSIS — Z888 Allergy status to other drugs, medicaments and biological substances status: Secondary | ICD-10-CM | POA: Insufficient documentation

## 2021-01-02 DIAGNOSIS — Z882 Allergy status to sulfonamides status: Secondary | ICD-10-CM | POA: Insufficient documentation

## 2021-01-02 HISTORY — PX: ROBOTIC ASSISTED TOTAL HYSTERECTOMY WITH BILATERAL SALPINGO OOPHERECTOMY: SHX6086

## 2021-01-02 LAB — GLUCOSE, CAPILLARY
Glucose-Capillary: 157 mg/dL — ABNORMAL HIGH (ref 70–99)
Glucose-Capillary: 113 mg/dL — ABNORMAL HIGH (ref 70–99)

## 2021-01-02 LAB — TYPE AND SCREEN
ABO/RH(D): A POS
Antibody Screen: NEGATIVE

## 2021-01-02 LAB — URINE CULTURE: Culture: >=100000 — AB

## 2021-01-02 LAB — ABO/RH: ABO/RH(D): A POS

## 2021-01-02 SURGERY — HYSTERECTOMY, TOTAL, ROBOT-ASSISTED, LAPAROSCOPIC, WITH BILATERAL SALPINGO-OOPHORECTOMY
Anesthesia: General | Site: Pelvis | Laterality: Bilateral

## 2021-01-02 MED ORDER — PHENYLEPHRINE 40 MCG/ML (10ML) SYRINGE FOR IV PUSH (FOR BLOOD PRESSURE SUPPORT)
PREFILLED_SYRINGE | INTRAVENOUS | Status: DC | PRN
  Administered 2021-01-02: 40 ug via INTRAVENOUS
  Administered 2021-01-02 (×3): 120 ug via INTRAVENOUS

## 2021-01-02 MED ORDER — LACTATED RINGERS IV SOLN: INTRAVENOUS | Status: DC

## 2021-01-02 MED ORDER — STERILE WATER FOR IRRIGATION IR SOLN
Status: DC | PRN
  Administered 2021-01-02: 1000 mL

## 2021-01-02 MED ORDER — PHENYLEPHRINE HCL-NACL 10-0.9 MG/250ML-% IV SOLN
INTRAVENOUS | Status: DC | PRN
  Administered 2021-01-02: 25 ug/min via INTRAVENOUS

## 2021-01-02 MED ORDER — LIDOCAINE 2% (20 MG/ML) 5 ML SYRINGE
INTRAMUSCULAR | Status: DC | PRN
  Administered 2021-01-02: 80 mg via INTRAVENOUS

## 2021-01-02 MED ORDER — LIDOCAINE 2% (20 MG/ML) 5 ML SYRINGE
INTRAMUSCULAR | Status: AC
  Filled 2021-01-02: qty 5

## 2021-01-02 MED ORDER — ONDANSETRON HCL 4 MG/2ML IJ SOLN
INTRAMUSCULAR | Status: DC | PRN
  Administered 2021-01-02: 4 mg via INTRAVENOUS

## 2021-01-02 MED ORDER — PROPOFOL 10 MG/ML IV BOLUS
INTRAVENOUS | Status: DC | PRN
  Administered 2021-01-02: 140 mg via INTRAVENOUS

## 2021-01-02 MED ORDER — DEXAMETHASONE SODIUM PHOSPHATE 4 MG/ML IJ SOLN
4.0000 mg | INTRAMUSCULAR | Status: DC
Start: 2021-01-02 — End: 2021-01-02

## 2021-01-02 MED ORDER — ONDANSETRON HCL 4 MG/2ML IJ SOLN: 4.0000 mg | Freq: Once | INTRAMUSCULAR | Status: DC | PRN

## 2021-01-02 MED ORDER — APREPITANT 40 MG PO CAPS
40.0000 mg | ORAL_CAPSULE | Freq: Once | ORAL | Status: AC
  Administered 2021-01-02: 40 mg via ORAL
  Filled 2021-01-02: qty 1

## 2021-01-02 MED ORDER — HYDROMORPHONE HCL 1 MG/ML IJ SOLN: 0.2500 mg | INTRAMUSCULAR | Status: DC | PRN

## 2021-01-02 MED ORDER — DEXAMETHASONE SODIUM PHOSPHATE 4 MG/ML IJ SOLN
INTRAMUSCULAR | Status: DC | PRN
  Administered 2021-01-02: 5 mg via INTRAVENOUS

## 2021-01-02 MED ORDER — BUPIVACAINE HCL 0.25 % IJ SOLN
INTRAMUSCULAR | Status: DC | PRN
  Administered 2021-01-02: 15 mL

## 2021-01-02 MED ORDER — BUPIVACAINE HCL (PF) 0.25 % IJ SOLN
INTRAMUSCULAR | Status: AC
  Filled 2021-01-02: qty 30

## 2021-01-02 MED ORDER — SUGAMMADEX SODIUM 200 MG/2ML IV SOLN
INTRAVENOUS | Status: DC | PRN
  Administered 2021-01-02: 200 mg via INTRAVENOUS

## 2021-01-02 MED ORDER — OXYCODONE HCL 5 MG PO TABS: 5.0000 mg | ORAL_TABLET | Freq: Once | ORAL | Status: DC | PRN

## 2021-01-02 MED ORDER — ROCURONIUM BROMIDE 10 MG/ML (PF) SYRINGE
PREFILLED_SYRINGE | INTRAVENOUS | Status: DC | PRN
  Administered 2021-01-02: 10 mg via INTRAVENOUS
  Administered 2021-01-02: 20 mg via INTRAVENOUS
  Administered 2021-01-02: 50 mg via INTRAVENOUS

## 2021-01-02 MED ORDER — ROCURONIUM BROMIDE 10 MG/ML (PF) SYRINGE
PREFILLED_SYRINGE | INTRAVENOUS | Status: AC
  Filled 2021-01-02: qty 10

## 2021-01-02 MED ORDER — OXYCODONE HCL 5 MG/5ML PO SOLN
5.0000 mg | Freq: Once | ORAL | Status: DC | PRN
Start: 2021-01-02 — End: 2021-01-02

## 2021-01-02 MED ORDER — LIDOCAINE 2% (20 MG/ML) 5 ML SYRINGE
INTRAMUSCULAR | Status: DC | PRN
  Administered 2021-01-02: 1 mg/kg/h via INTRAVENOUS

## 2021-01-02 MED ORDER — SODIUM CHLORIDE 0.9% FLUSH: 3.0000 mL | Freq: Two times a day (BID) | INTRAVENOUS | Status: DC

## 2021-01-02 MED ORDER — FENTANYL CITRATE (PF) 100 MCG/2ML IJ SOLN
INTRAMUSCULAR | Status: DC | PRN
  Administered 2021-01-02: 50 ug via INTRAVENOUS
  Administered 2021-01-02 (×2): 25 ug via INTRAVENOUS

## 2021-01-02 MED ORDER — LACTATED RINGERS IR SOLN
Status: DC | PRN
  Administered 2021-01-02: 1000 mL

## 2021-01-02 MED ORDER — SODIUM CHLORIDE 0.9 % IV SOLN
2.0000 g | INTRAVENOUS | Status: AC
  Administered 2021-01-02: 2 g via INTRAVENOUS
  Filled 2021-01-02: qty 2

## 2021-01-02 MED ORDER — ACETAMINOPHEN 500 MG PO TABS: 1000.0000 mg | ORAL_TABLET | ORAL | Status: DC

## 2021-01-02 MED ORDER — EPHEDRINE SULFATE-NACL 50-0.9 MG/10ML-% IV SOSY
PREFILLED_SYRINGE | INTRAVENOUS | Status: DC | PRN
  Administered 2021-01-02: 10 mg via INTRAVENOUS
  Administered 2021-01-02: 15 mg via INTRAVENOUS
  Administered 2021-01-02: 10 mg via INTRAVENOUS

## 2021-01-02 MED ORDER — GLYCOPYRROLATE 0.2 MG/ML IJ SOLN
INTRAMUSCULAR | Status: DC | PRN
  Administered 2021-01-02: .2 mg via INTRAVENOUS

## 2021-01-02 MED ORDER — STERILE WATER FOR INJECTION IJ SOLN
INTRAMUSCULAR | Status: AC
  Filled 2021-01-02: qty 10

## 2021-01-02 MED ORDER — EPHEDRINE SULFATE-NACL 50-0.9 MG/10ML-% IV SOSY: PREFILLED_SYRINGE | INTRAVENOUS | Status: DC | PRN

## 2021-01-02 MED ORDER — PROPOFOL 10 MG/ML IV BOLUS
INTRAVENOUS | Status: AC
  Filled 2021-01-02: qty 20

## 2021-01-02 MED ORDER — ACETAMINOPHEN 500 MG PO TABS
1000.0000 mg | ORAL_TABLET | Freq: Once | ORAL | Status: AC
  Administered 2021-01-02: 1000 mg via ORAL
  Filled 2021-01-02: qty 2

## 2021-01-02 MED ORDER — ENOXAPARIN SODIUM 40 MG/0.4ML IJ SOSY
40.0000 mg | PREFILLED_SYRINGE | INTRAMUSCULAR | Status: AC
  Administered 2021-01-02: 40 mg via SUBCUTANEOUS
  Filled 2021-01-02: qty 0.4

## 2021-01-02 MED ORDER — CHLORHEXIDINE GLUCONATE 0.12 % MT SOLN
15.0000 mL | Freq: Once | OROMUCOSAL | Status: AC
  Administered 2021-01-02: 15 mL via OROMUCOSAL

## 2021-01-02 MED ORDER — FENTANYL CITRATE (PF) 100 MCG/2ML IJ SOLN
INTRAMUSCULAR | Status: AC
  Filled 2021-01-02: qty 2

## 2021-01-02 SURGICAL SUPPLY — 79 items
ADH SKN CLS APL DERMABOND .7 (GAUZE/BANDAGES/DRESSINGS) ×1
AGENT HMST KT MTR STRL THRMB (HEMOSTASIS)
APL ESCP 34 STRL LF DISP (HEMOSTASIS)
APPLICATOR SURGIFLO ENDO (HEMOSTASIS) IMPLANT
BACTOSHIELD CHG 4% 4OZ (MISCELLANEOUS) ×1
BAG COUNTER SPONGE SURGICOUNT (BAG) IMPLANT
BAG LAPAROSCOPIC 12 15 PORT 16 (BASKET) IMPLANT
BAG RETRIEVAL 12/15 (BASKET) ×2
BAG SPEC RTRVL LRG 6X4 10 (ENDOMECHANICALS)
BAG SPNG CNTER NS LX DISP (BAG)
BLADE SURG SZ10 CARB STEEL (BLADE) IMPLANT
CELLS DAT CNTRL 66122 CELL SVR (MISCELLANEOUS) IMPLANT
COVER BACK TABLE 60X90IN (DRAPES) ×2 IMPLANT
COVER TIP SHEARS 8 DVNC (MISCELLANEOUS) ×1 IMPLANT
COVER TIP SHEARS 8MM DA VINCI (MISCELLANEOUS) ×2
DECANTER SPIKE VIAL GLASS SM (MISCELLANEOUS) IMPLANT
DERMABOND ADVANCED (GAUZE/BANDAGES/DRESSINGS) ×1
DERMABOND ADVANCED .7 DNX12 (GAUZE/BANDAGES/DRESSINGS) ×1 IMPLANT
DRAPE ARM DVNC X/XI (DISPOSABLE) ×4 IMPLANT
DRAPE COLUMN DVNC XI (DISPOSABLE) ×1 IMPLANT
DRAPE DA VINCI XI ARM (DISPOSABLE) ×8
DRAPE DA VINCI XI COLUMN (DISPOSABLE) ×2
DRAPE SHEET LG 3/4 BI-LAMINATE (DRAPES) ×2 IMPLANT
DRAPE SURG IRRIG POUCH 19X23 (DRAPES) ×2 IMPLANT
DRSG OPSITE POSTOP 4X6 (GAUZE/BANDAGES/DRESSINGS) IMPLANT
DRSG OPSITE POSTOP 4X8 (GAUZE/BANDAGES/DRESSINGS) IMPLANT
ELECT PENCIL ROCKER SW 15FT (MISCELLANEOUS) IMPLANT
ELECT REM PT RETURN 15FT ADLT (MISCELLANEOUS) ×2 IMPLANT
GLOVE SURG ENC MOIS LTX SZ6 (GLOVE) ×8 IMPLANT
GLOVE SURG ENC MOIS LTX SZ6.5 (GLOVE) ×4 IMPLANT
GOWN STRL REUS W/ TWL LRG LVL3 (GOWN DISPOSABLE) ×4 IMPLANT
GOWN STRL REUS W/TWL LRG LVL3 (GOWN DISPOSABLE) ×8
HOLDER FOLEY CATH W/STRAP (MISCELLANEOUS) IMPLANT
IRRIG SUCT STRYKERFLOW 2 WTIP (MISCELLANEOUS) ×2
IRRIGATION SUCT STRKRFLW 2 WTP (MISCELLANEOUS) ×1 IMPLANT
KIT PROCEDURE DA VINCI SI (MISCELLANEOUS)
KIT PROCEDURE DVNC SI (MISCELLANEOUS) IMPLANT
KIT TURNOVER KIT A (KITS) ×2 IMPLANT
MANIPULATOR UTERINE 4.5 ZUMI (MISCELLANEOUS) ×2 IMPLANT
NDL SPNL 18GX3.5 QUINCKE PK (NEEDLE) IMPLANT
NEEDLE HYPO 22GX1.5 SAFETY (NEEDLE) ×2 IMPLANT
NEEDLE SPNL 18GX3.5 QUINCKE PK (NEEDLE) IMPLANT
OBTURATOR OPTICAL STANDARD 8MM (TROCAR) ×2
OBTURATOR OPTICAL STND 8 DVNC (TROCAR) ×1
OBTURATOR OPTICALSTD 8 DVNC (TROCAR) ×1 IMPLANT
PACK ROBOT GYN CUSTOM WL (TRAY / TRAY PROCEDURE) ×2 IMPLANT
PAD POSITIONING PINK XL (MISCELLANEOUS) ×2 IMPLANT
PORT ACCESS TROCAR AIRSEAL 12 (TROCAR) ×1 IMPLANT
PORT ACCESS TROCAR AIRSEAL 5M (TROCAR) ×1
POUCH SPECIMEN RETRIEVAL 10MM (ENDOMECHANICALS) IMPLANT
RETRACTOR WND ALEXIS 18 MED (MISCELLANEOUS) IMPLANT
RETRACTOR WND ALEXIS 25 LRG (MISCELLANEOUS) IMPLANT
RTRCTR WOUND ALEXIS 18CM MED (MISCELLANEOUS)
RTRCTR WOUND ALEXIS 25CM LRG (MISCELLANEOUS)
SCRUB CHG 4% DYNA-HEX 4OZ (MISCELLANEOUS) ×1 IMPLANT
SEAL CANN UNIV 5-8 DVNC XI (MISCELLANEOUS) ×3 IMPLANT
SEAL XI 5MM-8MM UNIVERSAL (MISCELLANEOUS) ×6
SET TRI-LUMEN FLTR TB AIRSEAL (TUBING) ×2 IMPLANT
SPONGE T-LAP 18X18 ~~LOC~~+RFID (SPONGE) IMPLANT
SURGIFLO W/THROMBIN 8M KIT (HEMOSTASIS) IMPLANT
SUT MNCRL AB 4-0 PS2 18 (SUTURE) IMPLANT
SUT PDS AB 1 TP1 96 (SUTURE) IMPLANT
SUT VIC AB 0 CT1 27 (SUTURE) ×2
SUT VIC AB 0 CT1 27XBRD ANTBC (SUTURE) ×1 IMPLANT
SUT VIC AB 2-0 CT1 27 (SUTURE)
SUT VIC AB 2-0 CT1 TAPERPNT 27 (SUTURE) IMPLANT
SUT VIC AB 4-0 PS2 18 (SUTURE) ×4 IMPLANT
SUT VLOC 180 0 9IN  GS21 (SUTURE) ×2
SUT VLOC 180 0 9IN GS21 (SUTURE) ×1 IMPLANT
SYR 10ML LL (SYRINGE) IMPLANT
SYR 20ML LL LF (SYRINGE) IMPLANT
SYR 50ML LL SCALE MARK (SYRINGE) IMPLANT
TOWEL OR NON WOVEN STRL DISP B (DISPOSABLE) ×2 IMPLANT
TRAP SPECIMEN MUCUS 40CC (MISCELLANEOUS) IMPLANT
TRAY FOLEY MTR SLVR 16FR STAT (SET/KITS/TRAYS/PACK) ×2 IMPLANT
TROCAR XCEL NON-BLD 5MMX100MML (ENDOMECHANICALS) IMPLANT
UNDERPAD 30X36 HEAVY ABSORB (UNDERPADS AND DIAPERS) ×2 IMPLANT
WATER STERILE IRR 1000ML POUR (IV SOLUTION) ×2 IMPLANT
YANKAUER SUCT BULB TIP 10FT TU (MISCELLANEOUS) IMPLANT

## 2021-01-02 NOTE — Anesthesia Procedure Notes (Signed)
Procedure Name: Intubation Date/Time: 01/02/2021 2:04 PM Performed by: West Pugh, CRNA Pre-anesthesia Checklist: Patient identified, Emergency Drugs available, Suction available, Patient being monitored and Timeout performed Patient Re-evaluated:Patient Re-evaluated prior to induction Oxygen Delivery Method: Circle system utilized Preoxygenation: Pre-oxygenation with 100% oxygen Induction Type: IV induction Ventilation: Mask ventilation without difficulty Laryngoscope Size: Mac and 3 Grade View: Grade II Tube type: Oral Tube size: 7.0 mm Airway Equipment and Method: Stylet Placement Confirmation: ETT inserted through vocal cords under direct vision, positive ETCO2, CO2 detector and breath sounds checked- equal and bilateral Tube secured with: Tape Dental Injury: Teeth and Oropharynx as per pre-operative assessment

## 2021-01-02 NOTE — Op Note (Signed)
OPERATIVE NOTE 01/02/21  Surgeon: Donaciano Eva   Assistants: Dr Lahoma Crocker (an MD assistant was necessary for tissue manipulation, management of robotic instrumentation, retraction and positioning due to the complexity of the case and hospital policies).   Anesthesia: General endotracheal anesthesia  ASA Class: 3   Pre-operative Diagnosis: adnexal mass, urinary retention.  Post-operative Diagnosis:  benign left ovarian mass  Operation: Robotic-assisted laparoscopic total hysterectomy with bilateral salpingoophorectomy   Surgeon: Donaciano Eva  Assistant Surgeon: Lahoma Crocker MD  Anesthesia: GET  Urine Output: 100cc  Operative Findings:  : 15cm cystic left ovarian mass, normal appearing uterus and right tube and ovary. Normal upper abdomen and diaphragms. Normal appendix.  Estimated Blood Loss:   20cc       Total IV Fluids: 800 ml         Specimens:  washings, left tube and ovary, uterus with cervix and right tube and ovary.         Complications:  None; patient tolerated the procedure well.         Disposition: PACU - hemodynamically stable.  Procedure Details  The patient was seen in the Holding Room. The risks, benefits, complications, treatment options, and expected outcomes were discussed with the patient.  The patient concurred with the proposed plan, giving informed consent.  The site of surgery properly noted/marked. The patient was identified as Becky Fields and the procedure verified as a Robotic-assisted hysterectomy with bilateral salpingo oophorectomy. A Time Out was held and the above information confirmed.  After induction of anesthesia, the patient was draped and prepped in the usual sterile manner. Pt was placed in supine position after anesthesia and draped and prepped in the usual sterile manner. The abdominal drape was placed after the CholoraPrep had been allowed to dry for 3 minutes.  Her arms were tucked to her side with all  appropriate precautions.  The shoulders were stabilized with padded shoulder blocks applied to the acromium processes.  The patient was placed in the semi-lithotomy position in [place].  The perineum was prepped with Betadine. The patient was then prepped. Foley catheter was placed.  A sterile speculum was placed in the vagina.  The cervix was grasped with a single-tooth tenaculum and dilated with Kennon Rounds dilators.  The ZUMI uterine manipulator with a medium colpotomizer ring was placed without difficulty.  A pneum occluder balloon was placed over the manipulator.  OG tube placement was confirmed and to suction.   Next, a 5 mm skin incision was made 1 cm below the subcostal margin in the midclavicular line.  The 5 mm Optiview port and scope was used for direct entry.  Opening pressure was under 25mm CO2.  The abdomen was insufflated and the findings were noted as above.   At this point and all points during the procedure, the patient's intra-abdominal pressure did not exceed 15 mmHg. Next, a 8 mm skin incision was made a handspan above the umbilicus and a right and left port was placed about 10 cm lateral to the robot port on the right and left side.  A fourth arm was placed in the left lower quadrant 2 cm above and superior and medial to the anterior superior iliac spine.  All ports were placed under direct visualization.  The patient was placed in steep Trendelenburg.  Bowel was folded away into the upper abdomen.  The robot was docked in the normal manner.  The hysterectomy was started after the round ligament on the right side  was incised and the retroperitoneum was entered and the pararectal space was developed.  The ureter was noted to be on the medial leaf of the broad ligament.  The peritoneum above the ureter was incised and stretched and the infundibulopelvic ligament was skeletonized, cauterized and cut.  The posterior peritoneum was taken down to the level of the KOH ring.  The anterior  peritoneum was also taken down.  The bladder flap was created to the level of the KOH ring.  The uterine artery on the right side was skeletonized, cauterized and cut in the normal manner.  A similar procedure was performed on the left.  The left ovary was separated from the uterine cornua and placed in an endocatch bag for separate retrieval. The colpotomy was made and the uterus, cervix, right tube and ovary were amputated and delivered through the vagina.  The endocatch bag was delivered to the vagina and opened. The ovarian cyst was drained of its fluid in a controllled and contained fashion. The ovary was removed from the vagina within the bag and sent for frozen section which was benign. Pedicles were inspected and excellent hemostasis was achieved.    The colpotomy at the vaginal cuff was closed with Vicryl on a CT1 needle in a running manner.  Irrigation was used and excellent hemostasis was achieved.  At this point in the procedure was completed.  Robotic instruments were removed under direct visulaization.  The robot was undocked. The 10 mm ports were closed with Vicryl on a UR-5 needle and the fascia was closed with 0 Vicryl on a UR-5 needle.  The skin was closed with 4-0 Vicryl in a subcuticular manner.  Dermabond was applied.  Sponge, lap and needle counts correct x 2.  The patient was taken to the recovery room in stable condition.  The vagina was swabbed with  minimal bleeding noted.   All instrument and needle counts were correct x  3.   The patient was transferred to the recovery room in a stable condition.  Donaciano Eva, MD

## 2021-01-02 NOTE — Transfer of Care (Signed)
Immediate Anesthesia Transfer of Care Note  Patient: Becky Fields  Procedure(s) Performed: XI ROBOTIC ASSISTED TOTAL HYSTERECTOMY WITH BILATERAL SALPINGO OOPHORECTOMY AND PELVIC WASHINGS (Bilateral: Pelvis)  Patient Location: PACU  Anesthesia Type:General  Level of Consciousness: awake, drowsy and patient cooperative  Airway & Oxygen Therapy: Patient Spontanous Breathing and Patient connected to face mask oxygen  Post-op Assessment: Report given to RN and Post -op Vital signs reviewed and stable  Post vital signs: Reviewed and stable  Last Vitals:  Vitals Value Taken Time  BP 121/57 01/02/21 1555  Temp    Pulse 75 01/02/21 1558  Resp 19 01/02/21 1558  SpO2 100 % 01/02/21 1558  Vitals shown include unvalidated device data.  Last Pain:  Vitals:   01/02/21 1219  TempSrc:   PainSc: 0-No pain         Complications: No notable events documented.

## 2021-01-02 NOTE — Anesthesia Preprocedure Evaluation (Addendum)
Anesthesia Evaluation  Patient identified by MRN, date of birth, ID band Patient awake    Reviewed: Allergy & Precautions, NPO status , Patient's Chart, lab work & pertinent test results, reviewed documented beta blocker date and time   Airway Mallampati: II  TM Distance: >3 FB Neck ROM: Full    Dental no notable dental hx. (+) Teeth Intact, Dental Advisory Given   Pulmonary neg pulmonary ROS,    Pulmonary exam normal breath sounds clear to auscultation       Cardiovascular hypertension, Pt. on medications and Pt. 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 There was mild focal  basal hypertrophy of the septum. Systolic function was normal.  The estimated ejection fraction was in the range of 60% to 65%.  Wall motion was normal; there were no regional wall motion  abnormalities. Doppler parameters are consistent with abnormal  left ventricular relaxation (grade 1 diastolic dysfunction).  - Mitral valve: Calcified annulus. There was mild regurgitation.  - Atrial septum: There was an atrial septal aneurysm.    Neuro/Psych negative neurological ROS  negative psych ROS   GI/Hepatic negative GI ROS, (+)     substance abuse  alcohol use, Hepatitis -, C  Endo/Other  diabetes, Well Controlled, Type 2, Oral Hypoglycemic Agents  Renal/GU negative Renal ROS  Female GU complaint (ovarian mass)     Musculoskeletal negative musculoskeletal ROS (+)   Abdominal   Peds  Hematology negative hematology ROS (+) hct 41   Anesthesia Other Findings   Reproductive/Obstetrics negative OB ROS                            Anesthesia Physical Anesthesia Plan  ASA: 3  Anesthesia Plan: General   Post-op Pain Management:     Induction: Intravenous  PONV Risk Score and Plan: 4 or greater and Ondansetron, Dexamethasone, Treatment may vary due to age or medical condition and Aprepitant  Airway Management Planned: Oral ETT  Additional Equipment: None  Intra-op Plan:   Post-operative Plan: Extubation in OR  Informed Consent: I have reviewed the patients History and Physical, chart, labs and discussed the procedure including the risks, benefits and alternatives for the proposed anesthesia with the patient or authorized representative who has indicated his/her understanding and acceptance.     Dental advisory given  Plan Discussed with: CRNA and Anesthesiologist  Anesthesia Plan Comments: (GA w Lidocaine Drip)       Anesthesia Quick Evaluation

## 2021-01-02 NOTE — Interval H&P Note (Signed)
History and Physical Interval Note:  01/02/2021 12:17 PM  Becky Fields  has presented today for surgery, with the diagnosis of OVARIAN MASS URINARY RETENTION.  The various methods of treatment have been discussed with the patient and family. After consideration of risks, benefits and other options for treatment, the patient has consented to  Procedure(s): XI ROBOTIC ASSISTED TOTAL HYSTERECTOMY WITH BILATERAL SALPINGO OOPHORECTOMY; POSSIBLE STAGING (Bilateral) as a surgical intervention.  The patient's history has been reviewed, patient examined, no change in status, stable for surgery.  I have reviewed the patient's chart and labs.  Questions were answered to the patient's satisfaction.     Thereasa Solo

## 2021-01-02 NOTE — Telephone Encounter (Signed)
Diabetic shoes/inserts in..lvm for pt to call to schedule an appt to pick them up. 

## 2021-01-02 NOTE — Discharge Instructions (Signed)
Return to work: 4 weeks (2 weeks with physical restrictions).  Activity: 1. Be up and out of the bed during the day.  Take a nap if needed.  You may walk up steps but be careful and use the hand rail.  Stair climbing will tire you more than you think, you may need to stop part way and rest.   2. No lifting or straining for 4 weeks.  3. No driving for 1 weeks.  Do Not drive if you are taking narcotic pain medicine.  4. Shower daily.  Use soap and water on your incision and pat dry; don't rub.   5. No sexual activity and nothing in the vagina for 8 weeks.  Medications:  - Take ibuprofen and tylenol first line for pain control. Take these regularly (every 6 hours) to decrease the build up of pain.  - If necessary, for severe pain not relieved by ibuprofen, contact Dr Serita Grit office and you will be prescribed percocet.  - While taking percocet you should take sennakot every night to reduce the likelihood of constipation. If this causes diarrhea, stop its use.  Diet: 1. Low sodium Heart Healthy Diet is recommended.  2. It is safe to use a laxative if you have difficulty moving your bowels.   Wound Care: 1. Keep clean and dry.  Shower daily.  Reasons to call the Doctor:  Fever - Oral temperature greater than 100.4 degrees Fahrenheit Foul-smelling vaginal discharge Difficulty urinating Nausea and vomiting Increased pain at the site of the incision that is unrelieved with pain medicine. Difficulty breathing with or without chest pain New calf pain especially if only on one side Sudden, continuing increased vaginal bleeding with or without clots.   Follow-up: 1. See Everitt Amber in 4 weeks.  Contacts: For questions or concerns you should contact:  Dr. Everitt Amber at [phone] After hours and on week-ends call [phone] and ask to speak to the physician on call for Gynecologic Oncology

## 2021-01-03 ENCOUNTER — Encounter (HOSPITAL_COMMUNITY): Payer: Self-pay | Admitting: Gynecologic Oncology

## 2021-01-03 ENCOUNTER — Telehealth: Payer: Self-pay

## 2021-01-03 NOTE — Telephone Encounter (Signed)
Spoke with Ms. Becky Fields. She states she is eating, drinking and urinating well.  She is experiencing slight urinary leakage.She has had a couple of loose stools today(loose stools is normal for her with metformin) and passing gas. She is taking senokot 1 tablet at hs since she has loose stools from the metformin Encouraged her to drink plenty of water. She denies fever or chills. Incisions are dry and intact. Her pain is controlled with Tylenol and ibuprofen at this time.  Instructed to call office with any fever, chills, purulent drainage, uncontrolled pain or any other questions or concerns. Patient verbalizes understanding.  Pt aware of post op appointments as well as the office number [phone] and after hours number [phone] to call if she has any questions or concerns

## 2021-01-03 NOTE — Anesthesia Postprocedure Evaluation (Signed)
Anesthesia Post Note  Patient: Nomi Rudnicki  Procedure(s) Performed: XI ROBOTIC ASSISTED TOTAL HYSTERECTOMY WITH BILATERAL SALPINGO OOPHORECTOMY AND PELVIC WASHINGS (Bilateral: Pelvis)     Patient location during evaluation: PACU Anesthesia Type: General Level of consciousness: awake and alert Pain management: pain level controlled Vital Signs Assessment: post-procedure vital signs reviewed and stable Respiratory status: spontaneous breathing, nonlabored ventilation, respiratory function stable and patient connected to nasal cannula oxygen Cardiovascular status: blood pressure returned to baseline and stable Postop Assessment: no apparent nausea or vomiting Anesthetic complications: no   No notable events documented.  Last Vitals:  Vitals:   01/02/21 1645 01/02/21 1715  BP: (!) 130/58 (!) 142/65  Pulse: 65 69  Resp: 11 16  Temp: (!) 36.3 C 36.4 C  SpO2: 95% 95%    Last Pain:  Vitals:   01/02/21 1715  TempSrc:   PainSc: 0-No pain                 Barnet Glasgow

## 2021-01-06 ENCOUNTER — Telehealth: Payer: Self-pay | Admitting: Podiatry

## 2021-01-06 LAB — CYTOLOGY - NON PAP

## 2021-01-06 LAB — SURGICAL PATHOLOGY

## 2021-01-06 NOTE — Telephone Encounter (Signed)
Diabetic shoes/inserts in.. lvm for pt that I have added to pick up her diabetic shoes at her appt on 8.2.2022 with Dr Sherryle Lis and to call if that does not work.

## 2021-01-07 ENCOUNTER — Telehealth: Payer: Self-pay

## 2021-01-07 ENCOUNTER — Encounter: Payer: Medicare HMO | Admitting: Family Medicine

## 2021-01-07 NOTE — Telephone Encounter (Addendum)
Told Ms Cellucci that her pathology showed no cancer. Everything is benign.  She states that she is urinating well.  She needs to sit on the commode a little longer to make sure she has finished voiding. The leaking is improving. She has some with sneezing and coughing as well as sitting for a long time. She finished the nitrofurantoin last night.  She wants to know if she can go to work on 01-13-21 for 4 hours a day that week and then increase to 6 hours the following week and hopefully full time the next week. She does not do heavy lifting. She works at [employer]. She needs the money and misses her work. Told Ms Brickner that she will be called after above checked with [name], NP.

## 2021-01-08 NOTE — Telephone Encounter (Signed)
Told [name] that [name], NP said that she could work the hours as noted below as she is working at [employer]. She does not need a letter to return to wor.  She will call if her employer prefers to have a letter for return to work with restrictions.

## 2021-01-20 ENCOUNTER — Telehealth: Payer: Self-pay

## 2021-01-20 NOTE — Telephone Encounter (Signed)
Spoke with Becky Fields regarding her appt 8/4 and explained the need to move her appt earlier in the day. She has been rescheduled for 0815. Patient is in agreement to new time.

## 2021-01-21 ENCOUNTER — Other Ambulatory Visit: Payer: Self-pay

## 2021-01-21 ENCOUNTER — Encounter: Payer: Self-pay | Admitting: Podiatry

## 2021-01-21 ENCOUNTER — Ambulatory Visit: Payer: Medicare HMO | Admitting: Podiatry

## 2021-01-21 DIAGNOSIS — E1142 Type 2 diabetes mellitus with diabetic polyneuropathy: Secondary | ICD-10-CM | POA: Diagnosis not present

## 2021-01-21 DIAGNOSIS — M2142 Flat foot [pes planus] (acquired), left foot: Secondary | ICD-10-CM

## 2021-01-21 DIAGNOSIS — M2141 Flat foot [pes planus] (acquired), right foot: Secondary | ICD-10-CM | POA: Diagnosis not present

## 2021-01-21 DIAGNOSIS — L84 Corns and callosities: Secondary | ICD-10-CM

## 2021-01-21 NOTE — Progress Notes (Signed)
  Subjective:  Patient ID: Becky Fields, female    DOB: 1946-06-18,  MRN: [id number]  Chief Complaint  Patient presents with   Callouses    check pre ulcerative calluses/puds(in)    75 y.o. female presents with the above complaint. History confirmed with patient.    Objective:  Physical Exam: warm, good capillary refill, no trophic changes or ulcerative lesions, normal DP and PT pulses and normal sensory exam.  Bilateral submetatarsal 1 callus Left foot: Medial pinch callus Right Foot: Plantar IPJ callus hallux  Assessment:   1. Type 2 diabetes mellitus with peripheral neuropathy (HCC)   2. Callus of foot   3. Pes planus of both feet       Plan:  Patient was evaluated and treated and all questions answered.  Patient educated on diabetes. Discussed proper diabetic foot care and discussed risks and complications of disease. Educated patient in depth on reasons to return to the office immediately should he/she discover anything concerning or new on the feet. All questions answered. Discussed proper shoes as well.   All symptomatic hyperkeratoses were safely debrided with a sterile #15 blade to patient's level of comfort without incident. We discussed preventative and palliative care of these lesions including supportive and accommodative shoegear, padding, prefabricated and custom molded accommodative orthoses, use of a pumice stone and lotions/creams daily.  Her diabetic shoes were ready for pickup and I assessed them for form fit and function.  She was pleased with them and she will begin wearing these.  I think this will be the calluses even more.  Reviewed the break-in period with her.  Follow-up in 3 months.  No follow-ups on file.

## 2021-01-23 ENCOUNTER — Ambulatory Visit: Payer: Medicare HMO | Admitting: Gynecologic Oncology

## 2021-01-23 ENCOUNTER — Inpatient Hospital Stay: Payer: Medicare HMO | Attending: Gynecologic Oncology | Admitting: Gynecologic Oncology

## 2021-01-23 ENCOUNTER — Other Ambulatory Visit: Payer: Self-pay

## 2021-01-23 VITALS — BP 123/64 | HR 72 | Temp 97.5°F | Resp 18 | Ht 62.0 in | Wt 146.0 lb

## 2021-01-23 DIAGNOSIS — D270 Benign neoplasm of right ovary: Secondary | ICD-10-CM | POA: Diagnosis not present

## 2021-01-23 DIAGNOSIS — Z90710 Acquired absence of both cervix and uterus: Secondary | ICD-10-CM | POA: Diagnosis not present

## 2021-01-23 DIAGNOSIS — R1900 Intra-abdominal and pelvic swelling, mass and lump, unspecified site: Secondary | ICD-10-CM

## 2021-01-23 DIAGNOSIS — Z90722 Acquired absence of ovaries, bilateral: Secondary | ICD-10-CM | POA: Diagnosis not present

## 2021-01-23 DIAGNOSIS — D271 Benign neoplasm of left ovary: Secondary | ICD-10-CM | POA: Diagnosis not present

## 2021-01-23 NOTE — Patient Instructions (Signed)
Dr Denman George recommends follow-up with your PCP. You no longer require pap smears.

## 2021-01-23 NOTE — Progress Notes (Signed)
Follow-up Note: Gyn-Onc  Consult was requested by Dr. McDiarmid for the evaluation of Becky Fields 75 y.o. female  CC:  Chief Complaint  Patient presents with   Pelvic mass    Assessment/Plan:  Ms. Becky Fields  is a 75 y.o.  year old with a history of a 12cm cystic pelvis mass causing urinary retention, s/p robotic assisted total hysterectomy and BSO on 01/02/2021 for benign pathology.  She is doing well postoperatively and does not require specific follow-up for this condition   HPI: [name] is a 75 year old P2 who was seen in consultation at the request of Dr Matilde Sprang for evaluation of a posterior pelvic mass likely arising from an ovary that was associated with urinary retention.  The patient reported acute onset urinary retention and increasing pelvic plush pressure on December 18, 2020.  She presented to the emergency department with a symptom and a Foley was placed.  A CT scan was performed on 12/18/2020 which revealed a 12 cm complex cystic mass posterior to the uterus with a decompressed bladder.  There was no lymphadenopathy, no hydronephrosis.  Dr. Matilde Sprang consulted from urology and felt that the cystic mass likely represented adnexal mass and recommended gynecologic oncology consultation.  A Ca1 25 was drawn on 12/24/20 which was normal at 16.8.   The patient returned to the ED for discomfort on 12/20/20 and a pelvic US was performed which demonstrated a 6.6 x 1.7 x 2.8 cm uterus with a large complex cystic mass with thickened septations containing flow measuring 12.8 x 8 x 10.9 cm posterior to the uterus.  Interval Hx:  On 01/02/21 she underwent robotic assisted total hysterectomy with BSO. Intraoperative findings were significant for a 15 cm cystic left ovarian mass, normal-appearing uterus and right tube and ovary.  Normal upper abdomen and diaphragms, normal appendix, benign pathology on frozen section. Surgery was uncomplicated.  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  Since surgery she has done well with no complaints.   Current Meds:  Outpatient Encounter Medications as of 74/09/2020  Medication Sig   albuterol (VENTOLIN HFA) 108 (90 Base) MCG/ACT inhaler Inhale 2 puffs into the lungs every 4 (four) hours as needed for wheezing or shortness of breath. (Patient not taking: No sig reported)   ibuprofen (ADVIL) 800 MG tablet Take 1 tablet (800 mg total) by mouth every 8 (eight) hours as needed for moderate pain. For AFTER surgery only   losartan (COZAAR) 100 MG tablet Take 1 tablet by mouth once daily (Patient taking differently: Take 100 mg by mouth every evening.)   metFORMIN (GLUCOPHAGE-XR) 500 MG 24 hr tablet TAKE 2 TABLETS BY MOUTH ONCE DAILY WITH BREAKFAST (Patient taking differently: Take 1,000 mg by mouth daily with breakfast.)   metoprolol succinate (TOPROL-XL) 50 MG 24 hr tablet TAKE 1 TABLET BY MOUTH AT BEDTIME WITH A MEAL OR  IMMEDIATELY  FOLLOWING  A  MEAL (Patient taking differently: Take 50 mg by mouth every evening.)   nitrofurantoin, macrocrystal-monohydrate, (MACROBID) 100 MG capsule Take 1 capsule (100 mg total) by mouth 2 (two) times daily.   Polyethyl Glycol-Propyl Glycol (SYSTANE OP) Apply 1 drop to eye 2 (two) times daily as needed (dry eyes). Reported on 09/05/2015 (Patient not taking: No sig reported)   pravastatin (PRAVACHOL) 40 MG tablet Take 1 tablet by mouth once daily (Patient taking differently: Take 40 mg by mouth daily.)   senna-docusate (SENOKOT-S) 8.6-50 MG tablet Take 2 tablets by mouth at  bedtime. For AFTER surgery, do not take if having diarrhea   traMADol (ULTRAM) 50 MG tablet Take 1 tablet (50 mg total) by mouth every 6 (six) hours as needed for severe pain. For AFTER surgery only, do not take and drive   No facility-administered encounter medications on file as of 01/23/2021.    Allergy:  Allergies  Allergen Reactions   Lisinopril     REACTION: causes cough   Sulfa Antibiotics     Told  as a child allergic    Social Hx:   Social History   Socioeconomic History   Marital status: Widowed    Spouse name: Ron   Number of children: 2   Years of education: Bachelors   Highest education level: Not on file  Occupational History   Occupation: Surveyor, minerals    Employer: [employer]  Tobacco Use   Smoking status: Never   Smokeless tobacco: Never  Vaping Use   Vaping Use: Never used  Substance and Sexual Activity   Alcohol use: Yes    Alcohol/week: 6.0 standard drinks    Types: 6 Standard drinks or equivalent per week    Comment: Occasional glass of wine on the weekends   Drug use: No   Sexual activity: Not Currently  Other Topics Concern   Not on file  Social History Narrative   Health Care POA:    Emergency Contact: husband, Ismay Hermann (c) [phone]   End of Life Plan:    Who lives with you: husband, Ron, occasionally son who works out of town   Any pets: 2 dogs, 2 cats   Diet: Pt has a varied diet of protein, starch, vegetables.  Pt is not currently on any kind of diet modifications for DM control.   Exercise: Pt has not regular exercise routine.   Seatbelts: Pt reports wearing seatbelt when in vehicles.    Sun Exposure/Protection: Pt reports not wearing sun protection.   Hobbies: Spending time with granddaughter, reading         Social Determinants of Health   Financial Resource Strain: Not on file  Food Insecurity: Not on file  Transportation Needs: Not on file  Physical Activity: Not on file  Stress: Not on file  Social Connections: Not on file  Intimate Partner Violence: Not on file    Past Surgical Hx:  Past Surgical History:  Procedure Laterality Date   CHOLECYSTECTOMY     COLONOSCOPY     EYE SURGERY  02/2012   bilateral cataract surg   LEFT HEART CATHETERIZATION WITH CORONARY ANGIOGRAM N/A 10/17/2012   Procedure: LEFT HEART CATHETERIZATION WITH CORONARY ANGIOGRAM;  Surgeon: Sherren Mocha, MD;  Location: [facility] CATH LAB;  Service: Cardiovascular;   Laterality: N/A;   ROBOTIC ASSISTED TOTAL HYSTERECTOMY WITH BILATERAL SALPINGO OOPHERECTOMY Bilateral 01/02/2021   Procedure: XI ROBOTIC ASSISTED TOTAL HYSTERECTOMY WITH BILATERAL SALPINGO OOPHORECTOMY AND PELVIC WASHINGS;  Surgeon: Everitt Amber, MD;  Location: [facility] OR[facility];  Service: Gynecology;  Laterality: Bilateral;    Past Medical Hx:  Past Medical History:  Diagnosis Date   Cataract    Diabetes mellitus    Ejection fraction    .   Hepatitis C    Hypertension    Hypertriglyceridemia    Pneumonia    Takotsubo syndrome    October 16, 2012    Past Gynecological History:  SVD x 2 No LMP recorded. Patient is postmenopausal.  Family Hx:  Family History  Problem Relation Age of Onset   Hypertension Mother    Aneurysm  Father    Hypertension Sister    CAD Brother 64   Heart attack Neg Hx    Stroke Neg Hx    Colon cancer Neg Hx    Breast cancer Neg Hx    Ovarian cancer Neg Hx    Endometrial cancer Neg Hx    Pancreatic cancer Neg Hx    Prostate cancer Neg Hx     Review of Systems:  Constitutional  Feels well,    ENT Normal appearing ears and nares bilaterally Skin/Breast  No rash, sores, jaundice, itching, dryness Cardiovascular  No chest pain, shortness of breath, or edema  Pulmonary  No cough or wheeze.  Gastro Intestinal  No nausea, vomitting, or diarrhoea. No bright red blood per rectum, no abdominal pain, change in bowel movement, or constipation.  Genito Urinary  No frequency, urgency, dysuria, resolved urinary retention Musculo Skeletal  No myalgia, arthralgia, joint swelling or pain  Neurologic  No weakness, numbness, change in gait,  Psychology  No depression, anxiety, insomnia.   Vitals:  Blood pressure 123/64, pulse 72, temperature (!) 97.5 F (36.4 C), temperature source Tympanic, resp. rate 18, height '5\' 2"'$  (1.575 m), weight 146 lb (66.2 kg), SpO2 100 %.  Physical Exam: WD in NAD Neck  Supple NROM, without any enlargements.  Lymph Node Survey No  cervical supraclavicular or inguinal adenopathy Cardiovascular  Well perfused peripheries Lungs  No increased WOB Skin  No rash/lesions/breakdown  Psychiatry  Alert and oriented to person, place, and time  Abdomen  Normoactive bowel sounds, abdomen soft, non-tender and nonobese (BMI 26) without evidence of hernia. Well healed incisions.  Back No CVA tenderness Genito Urinary  Well healed vaginal cuff Rectal  Smooth cystic immobile posterior mass, no obvious rectal infiltration. Extremities  No bilateral cyanosis, clubbing or edema.  Thereasa Solo, MD  01/23/2021, 10:38 AM

## 2021-02-04 DIAGNOSIS — I10 Essential (primary) hypertension: Secondary | ICD-10-CM | POA: Diagnosis not present

## 2021-02-04 DIAGNOSIS — H35033 Hypertensive retinopathy, bilateral: Secondary | ICD-10-CM | POA: Diagnosis not present

## 2021-02-04 DIAGNOSIS — H5203 Hypermetropia, bilateral: Secondary | ICD-10-CM | POA: Diagnosis not present

## 2021-02-04 LAB — HM DIABETES EYE EXAM

## 2021-02-11 ENCOUNTER — Encounter: Payer: Self-pay | Admitting: Family Medicine

## 2021-02-12 ENCOUNTER — Ambulatory Visit: Payer: Medicare HMO | Admitting: Gynecologic Oncology

## 2021-03-28 ENCOUNTER — Other Ambulatory Visit: Payer: Medicare HMO

## 2021-04-07 ENCOUNTER — Other Ambulatory Visit: Payer: Medicare HMO

## 2021-04-08 ENCOUNTER — Other Ambulatory Visit: Payer: Self-pay

## 2021-04-08 ENCOUNTER — Ambulatory Visit: Payer: Medicare HMO | Admitting: Podiatry

## 2021-04-08 DIAGNOSIS — E1142 Type 2 diabetes mellitus with diabetic polyneuropathy: Secondary | ICD-10-CM

## 2021-04-08 DIAGNOSIS — B353 Tinea pedis: Secondary | ICD-10-CM

## 2021-04-08 DIAGNOSIS — L84 Corns and callosities: Secondary | ICD-10-CM | POA: Diagnosis not present

## 2021-04-08 MED ORDER — CLOTRIMAZOLE-BETAMETHASONE 1-0.05 % EX CREA: 1.0000 "application " | TOPICAL_CREAM | Freq: Two times a day (BID) | CUTANEOUS | 2 refills | Status: DC

## 2021-04-09 NOTE — Progress Notes (Signed)
  Subjective:  Patient ID: Becky Fields, female    DOB: 1946/06/17,  MRN: [id number]  Chief Complaint  Patient presents with   Callouses   Diabetes   Peripheral Neuropathy    75 y.o. female presents with the above complaint. History confirmed with patient.  Has itchy dry skin on the right foot   Objective:  Physical Exam: warm, good capillary refill, no trophic changes or ulcerative lesions, normal DP and PT pulses and normal sensory exam.  On the right foot she has dry scaling skin in between the toes on the plantar sulcus and on the plantar  Bilateral submetatarsal 1 callus Left foot: Medial pinch callus Right Foot: Plantar IPJ callus hallux  Assessment:   1. Callus of foot   2. Type 2 diabetes mellitus with peripheral neuropathy (HCC)   3. Tinea pedis of right foot       Plan:  Patient was evaluated and treated and all questions answered.  Patient educated on diabetes. Discussed proper diabetic foot care and discussed risks and complications of disease. Educated patient in depth on reasons to return to the office immediately should he/she discover anything concerning or new on the feet. All questions answered. Discussed proper shoes as well.   All symptomatic hyperkeratoses were safely debrided with a sterile #15 blade to patient's level of comfort without incident. We discussed preventative and palliative care of these lesions including supportive and accommodative shoegear, padding, prefabricated and custom molded accommodative orthoses, use of a pumice stone and lotions/creams daily.  Discussed the etiology and treatment options for tinea pedis.  Discussed topical and oral treatment.  Recommended topical treatment with 2% ketoconazole cream.  This was sent to the patient's pharmacy.  Also discussed appropriate foot hygiene, use of antifungal spray such as Tinactin in shoes, as well as cleaning her foot surfaces such as showers and bathroom floors with bleach.   Return in  about 3 months (around 07/09/2021) for at risk diabetic foot care.

## 2021-04-18 ENCOUNTER — Other Ambulatory Visit: Payer: Self-pay

## 2021-04-18 ENCOUNTER — Ambulatory Visit
Admission: RE | Admit: 2021-04-18 | Discharge: 2021-04-18 | Disposition: A | Discharge status: Home / Self Care | Payer: Medicare HMO | Source: Ambulatory Visit | Attending: Family Medicine | Admitting: Family Medicine

## 2021-04-18 DIAGNOSIS — K7460 Unspecified cirrhosis of liver: Secondary | ICD-10-CM | POA: Diagnosis not present

## 2021-04-18 DIAGNOSIS — B182 Chronic viral hepatitis C: Secondary | ICD-10-CM

## 2021-04-22 ENCOUNTER — Telehealth: Payer: Self-pay | Admitting: Family Medicine

## 2021-04-22 NOTE — Telephone Encounter (Signed)
Called and informed no evidence of hepatocellular carcinoma on ultrasound.  Continue annual screening.

## 2021-05-20 ENCOUNTER — Other Ambulatory Visit: Payer: Medicare HMO

## 2021-05-27 ENCOUNTER — Ambulatory Visit (INDEPENDENT_AMBULATORY_CARE_PROVIDER_SITE_OTHER): Payer: Medicare HMO

## 2021-05-27 ENCOUNTER — Other Ambulatory Visit: Payer: Self-pay

## 2021-05-27 ENCOUNTER — Other Ambulatory Visit (INDEPENDENT_AMBULATORY_CARE_PROVIDER_SITE_OTHER): Payer: Medicare HMO

## 2021-05-27 ENCOUNTER — Other Ambulatory Visit: Payer: Self-pay | Admitting: Family Medicine

## 2021-05-27 DIAGNOSIS — E1142 Type 2 diabetes mellitus with diabetic polyneuropathy: Secondary | ICD-10-CM

## 2021-05-27 DIAGNOSIS — Z23 Encounter for immunization: Secondary | ICD-10-CM | POA: Diagnosis not present

## 2021-05-27 DIAGNOSIS — E781 Pure hyperglyceridemia: Secondary | ICD-10-CM

## 2021-05-27 LAB — POCT GLYCOSYLATED HEMOGLOBIN (HGB A1C): HbA1c, POC (controlled diabetic range): 6.5 % (ref 0.0–7.0)

## 2021-05-28 LAB — TRIGLYCERIDES: Triglycerides: 147 mg/dL (ref 0–149)

## 2021-07-10 ENCOUNTER — Other Ambulatory Visit: Payer: Self-pay

## 2021-07-10 ENCOUNTER — Ambulatory Visit: Payer: Medicare HMO | Admitting: Podiatry

## 2021-07-10 DIAGNOSIS — L84 Corns and callosities: Secondary | ICD-10-CM | POA: Diagnosis not present

## 2021-07-10 DIAGNOSIS — S90121A Contusion of right lesser toe(s) without damage to nail, initial encounter: Secondary | ICD-10-CM

## 2021-07-10 DIAGNOSIS — E1142 Type 2 diabetes mellitus with diabetic polyneuropathy: Secondary | ICD-10-CM

## 2021-07-10 MED ORDER — MUPIROCIN 2 % EX OINT: 1.0000 "application " | TOPICAL_OINTMENT | Freq: Every day | CUTANEOUS | 2 refills | Status: DC

## 2021-07-10 NOTE — Progress Notes (Signed)
°  Subjective:  Patient ID: Becky Fields, female    DOB: 1946-06-17,  MRN: [id number]  Chief Complaint  Patient presents with   Diabetes    At risk diabetic foot care    76 y.o. female presents with the above complaint. History confirmed with patient.  Doing well she has been using the ketoconazole ointment she has a new issue where she dropped something on her second toe a few weeks ago and damage to the skin and nail but thinks that it is healing at this point.   Objective:  Physical Exam: warm, good capillary refill, no trophic changes or ulcerative lesions, normal DP and PT pulses and normal sensory exam.  Tinea pedis much improved.  Scab on distal second toe, no bruising ecchymosis or edema of the phalanx  Bilateral submetatarsal 1 callus Left foot: Medial pinch callus Right Foot: Plantar IPJ callus hallux  Assessment:   1. Contusion of lesser toe of right foot without damage to nail, initial encounter   2. Callus of foot   3. Type 2 diabetes mellitus with peripheral neuropathy (HCC)       Plan:  Patient was evaluated and treated and all questions answered.  Today she had a new issue of a contusion of the right second toe with a small scab and bruising under the second toenail.  Appears to be healing at this point.  No evidence of edema or bruising to suggest fracture of phalanx.  I did not take x-rays and we discussed this but likely would not change management at this point.  I did prescribe her mupirocin ointment to put on the scab and to let me know if it does not heal in the next few weeks  Patient educated on diabetes. Discussed proper diabetic foot care and discussed risks and complications of disease. Educated patient in depth on reasons to return to the office immediately should he/she discover anything concerning or new on the feet. All questions answered. Discussed proper shoes as well.   All symptomatic hyperkeratoses were safely debrided with a sterile #15 blade to  patient's level of comfort without incident. We discussed preventative and palliative care of these lesions including supportive and accommodative shoegear, padding, prefabricated and custom molded accommodative orthoses, use of a pumice stone and lotions/creams daily.  Tinea pedis is much improved she can can discontinue the ketoconazole ointment at this point.   Return in about 3 months (around 10/08/2021) for at risk diabetic foot care.

## 2021-08-20 ENCOUNTER — Other Ambulatory Visit: Payer: Self-pay | Admitting: Family Medicine

## 2021-08-20 DIAGNOSIS — I10 Essential (primary) hypertension: Secondary | ICD-10-CM

## 2021-08-20 DIAGNOSIS — E1142 Type 2 diabetes mellitus with diabetic polyneuropathy: Secondary | ICD-10-CM

## 2021-08-27 ENCOUNTER — Ambulatory Visit (INDEPENDENT_AMBULATORY_CARE_PROVIDER_SITE_OTHER): Payer: Medicare HMO

## 2021-08-27 ENCOUNTER — Other Ambulatory Visit: Payer: Self-pay

## 2021-08-27 VITALS — Ht 62.0 in | Wt 145.0 lb

## 2021-08-27 DIAGNOSIS — Z0000 Encounter for general adult medical examination without abnormal findings: Secondary | ICD-10-CM

## 2021-08-27 NOTE — Progress Notes (Signed)
Subjective:   Becky Fields is a 76 y.o. female who presents for Medicare Annual (Subsequent) preventive examination.  Patient consented to have virtual visit and was identified by name and date of birth. Method of visit: Telephone  Encounter participants: Patient: [name] - located at Home Nurse/Provider: Dorna Bloom - located at [facility] Others (if applicable): NA  Review of Systems: Defer to PCP.  Cardiac Risk Factors include: hypertension;dyslipidemia;diabetes mellitus;advanced age (>13mn, >>84women)  Objective:   Vitals: Ht '5\' 2"'$  (1.575 m)    Wt 145 lb (65.8 kg)    BMI 26.52 kg/m   Body mass index is 26.52 kg/m.  Advanced Directives 08/27/2021 12/30/2020 12/25/2020 12/20/2020 12/05/2020 06/27/2020 03/20/2020  Does Patient Have a Medical Advance Directive? No No No No No No No  Does patient want to make changes to medical advance directive? - - - - - - -  Would patient like information on creating a medical advance directive? Yes (MAU/Ambulatory/Procedural Areas - Information given) - Yes (MAU/Ambulatory/Procedural Areas - Information given) - No - Patient declined No - Patient declined No - Patient declined  Pre-existing out of facility DNR order (yellow form or pink MOST form) - - - - - - -   Tobacco Social History   Tobacco Use  Smoking Status Never   Passive exposure: Never  Smokeless Tobacco Never     Clinical Intake:  Pre-visit preparation completed: Yes  Pain Score: 0-No pain  Nutritional Status: BMI 25 -29 Overweight Diabetes: Yes Controlled  How often do you need to have someone help you when you read instructions, pamphlets, or other written materials from your doctor or pharmacy?: 1 - Never What is the last grade level you completed in school?: Bachelors Degree  Interpreter Needed?: No  Past Medical History:  Diagnosis Date   Cataract    Diabetes mellitus    Ejection fraction    .   Hepatitis C    Hypertension    Hypertriglyceridemia    Pneumonia     Takotsubo syndrome    October 16, 2012   Past Surgical History:  Procedure Laterality Date   CHOLECYSTECTOMY     COLONOSCOPY     EYE SURGERY  02/2012   bilateral cataract surg   LEFT HEART CATHETERIZATION WITH CORONARY ANGIOGRAM N/A 10/17/2012   Procedure: LEFT HEART CATHETERIZATION WITH CORONARY ANGIOGRAM;  Surgeon: MSherren Mocha MD;  [facility];  Service: Cardiovascular;  Laterality: N/A;   ROBOTIC ASSISTED TOTAL HYSTERECTOMY WITH BILATERAL SALPINGO OOPHERECTOMY Bilateral 01/02/2021   Procedure: XI ROBOTIC ASSISTED TOTAL HYSTERECTOMY WITH BILATERAL SALPINGO OOPHORECTOMY AND PELVIC WASHINGS;  Surgeon: REveritt Amber MD;  Location: [facility];  Service: Gynecology;  Laterality: Bilateral;   Family History  Problem Relation Age of Onset   Hypertension Mother    Aneurysm Father    Hypertension Sister    CAD Brother 427  Diabetes Brother    Heart attack Neg Hx    Stroke Neg Hx    Colon cancer Neg Hx    Breast cancer Neg Hx    Ovarian cancer Neg Hx    Endometrial cancer Neg Hx    Pancreatic cancer Neg Hx    Prostate cancer Neg Hx    Social History   Socioeconomic History   Marital status: Widowed    Spouse name: Not on file   Number of children: 2   Years of education: Bachelors   Highest education level: Associate degree: academic program  Occupational History   Occupation:  office assistant    Employer: [employer]  Tobacco Use   Smoking status: Never    Passive exposure: Never   Smokeless tobacco: Never  Vaping Use   Vaping Use: Never used  Substance and Sexual Activity   Alcohol use: Yes    Alcohol/week: 6.0 standard drinks    Types: 6 Standard drinks or equivalent per week    Comment: Occasional glass of wine on the weekends   Drug use: No   Sexual activity: Not Currently  Other Topics Concern   Not on file  Social History Narrative   Patient lives alone. Husband passed away. Son is living with her for the time being.   Patient has 2 sons and grandchildren. Active  in their lives.    Patient involved in animal rescue.    Enjoys reading, spending time with her family and cats.       Social Determinants of Health   Financial Resource Strain: Low Risk    Difficulty of Paying Living Expenses: Not hard at all  Food Insecurity: No Food Insecurity   Worried About Charity fundraiser in the Last Year: Never true   [place] in the Last Year: Never true  Transportation Needs: No Transportation Needs   Lack of Transportation (Medical): No   Lack of Transportation (Non-Medical): No  Physical Activity: Inactive   Days of Exercise per Week: 0 days   Minutes of Exercise per Session: 0 min  Stress: No Stress Concern Present   Feeling of Stress : Not at all  Social Connections: Moderately Integrated   Frequency of Communication with Friends and Family: More than three times a week   Frequency of Social Gatherings with Friends and Family: More than three times a week   Attends Religious Services: 1 to 4 times per year   Active Member of [employer] or Organizations: Yes   Attends Archivist Meetings: 1 to 4 times per year   Marital Status: Widowed   Outpatient Encounter Medications as of 08/27/2021  Medication Sig   clotrimazole-betamethasone (LOTRISONE) cream Apply 1 application topically 2 (two) times daily.   ibuprofen (ADVIL) 800 MG tablet Take 1 tablet (800 mg total) by mouth every 8 (eight) hours as needed for moderate pain. For AFTER surgery only   losartan (COZAAR) 100 MG tablet Take 1 tablet by mouth once daily   metFORMIN (GLUCOPHAGE-XR) 500 MG 24 hr tablet TAKE 2 TABLETS BY MOUTH ONCE DAILY WITH BREAKFAST (Patient taking differently: 500 mg. TAKE 1 TABLETS BY MOUTH ONCE DAILY WITH BREAKFAST)   metoprolol succinate (TOPROL-XL) 50 MG 24 hr tablet TAKE 1 TABLET BY MOUTH AT BEDTIME WITH A MEAL OR  IMMEDIATELY  FOLLOWING  A  MEAL (Patient taking differently: Take 50 mg by mouth every evening.)   mupirocin ointment (BACTROBAN) 2 % Apply 1  application topically daily.   pravastatin (PRAVACHOL) 40 MG tablet Take 1 tablet by mouth once daily (Patient taking differently: Take 40 mg by mouth daily.)   albuterol (VENTOLIN HFA) 108 (90 Base) MCG/ACT inhaler Inhale 2 puffs into the lungs every 4 (four) hours as needed for wheezing or shortness of breath. (Patient not taking: Reported on 12/25/2020)   nitrofurantoin, macrocrystal-monohydrate, (MACROBID) 100 MG capsule Take 1 capsule (100 mg total) by mouth 2 (two) times daily. (Patient not taking: Reported on 08/27/2021)   Polyethyl Glycol-Propyl Glycol (SYSTANE OP) Apply 1 drop to eye 2 (two) times daily as needed (dry eyes). Reported on 09/05/2015 (Patient not taking: Reported  on 12/25/2020)   senna-docusate (SENOKOT-S) 8.6-50 MG tablet Take 2 tablets by mouth at bedtime. For AFTER surgery, do not take if having diarrhea (Patient not taking: Reported on 08/27/2021)   traMADol (ULTRAM) 50 MG tablet Take 1 tablet (50 mg total) by mouth every 6 (six) hours as needed for severe pain. For AFTER surgery only, do not take and drive (Patient not taking: Reported on 08/27/2021)   No facility-administered encounter medications on file as of 08/27/2021.   Activities of Daily Living In your present state of health, do you have any difficulty performing the following activities: 08/27/2021 12/30/2020  Hearing? N N  Vision? N N  Difficulty concentrating or making decisions? N N  Walking or climbing stairs? N N  Dressing or bathing? N N  Doing errands, shopping? N N  Preparing Food and eating ? N -  Using the Toilet? N -  In the past six months, have you accidently leaked urine? N -  Do you have problems with loss of bowel control? N -  Managing your Medications? N -  Managing your Finances? N -  Housekeeping or managing your Housekeeping? N -  Some recent data might be hidden   Patient Care Team: Zenia Resides, MD as PCP - General Marica Otter, OD (Optometry) Comer, Okey Regal, MD as Consulting Physician  (Infectious Diseases) McDonald, Stephan Minister, DPM as Consulting Physician (Podiatry) Everitt Amber, MD (Gynecologic Oncology)  Assessment:   This is a routine wellness examination for Porfiria.  Exercise Activities and Dietary recommendations Current Exercise Habits: The patient has a physically strenuous job, but has no regular exercise apart from work., Exercise limited by: cardiac condition(s)   Goals       Weight (lb) < 140 lb (63.5 kg) (pt-stated)       Fall Risk Fall Risk  08/27/2021 12/18/2020 12/05/2020 06/27/2020 12/22/2018  Falls in the past year? '1 1 1 1 1  '$ Number falls in past yr: 1 0 1 0 1  Injury with Fall? 1 0 0 0 0  Comment - - - - Just bumps and bruises  Risk Factor Category  - - - - -  Risk for fall due to : Impaired balance/gait - Impaired balance/gait - -  Risk for fall due to: Comment - - - - -  Follow up Falls prevention discussed - - - Follow up appointment  Comment - - - - MD informed   Patient reports her balance is off and has had 2 falls. Patient does not use any assistive devices to ambulate at this time.   Is the patient's home free of loose throw rugs in walkways, pet beds, electrical cords, etc?   yes      Grab bars in the bathroom? yes      Handrails on the stairs?   yes      Adequate lighting?   yes  Depression Screen PHQ 2/9 Scores 08/27/2021 12/18/2020 12/05/2020 06/27/2020  PHQ - 2 Score 0 0 0 0  PHQ- 9 Score - 0 0 0    Cognitive Function MMSE - Mini Mental State Exam 11/18/2011  Orientation to time 5  Orientation to Place 5  Registration 3  Attention/ Calculation 5  Recall 2  Language- name 2 objects 2  Language- repeat 1  Language- follow 3 step command 3  Language- read & follow direction 1  Write a sentence 1  Copy design 1  Total score 29   6CIT Screen 08/27/2021  What Year? 0  points  What month? 0 points  What time? 0 points  Count back from 20 0 points  Months in reverse 0 points  Repeat phrase 0 points  Total Score 0   Immunization  History  Administered Date(s) Administered   Fluad Quad(high Dose 65+) 03/20/2020   Influenza Split 06/12/2011, 05/13/2012   Influenza Whole 05/02/2007, 04/13/2008, 07/03/2009   Influenza,inj,Quad PF,6+ Mos 02/24/2013, 02/21/2014, 02/22/2015, 04/02/2016, 03/04/2017, 03/10/2018, 04/19/2019   PFIZER Comirnaty(Gray Top)Covid-19 Tri-Sucrose Vaccine 12/05/2020   PFIZER(Purple Top)SARS-COV-2 Vaccination 08/05/2019, 08/27/2019, 04/08/2020   Pfizer Covid-19 Vaccine Bivalent Booster 53yr & up 05/27/2021   Pneumococcal Conjugate-13 12/15/2013   Pneumococcal Polysaccharide-23 11/06/2011   Td 10/20/2004   Tdap 10/31/2014   Zoster, Live 04/23/2014   Zoster, Unspecified 04/23/2014   Qualifies for Shingles Vaccine? Yes   Shingrix Completed: No, Education has been provided regarding the importance of this vaccine. Advised may receive this vaccine at local pharmacy or Health Dept. Aware to provide a copy of the vaccination record if obtained from local pharmacy or Health Dept. Verbalized acceptance and understanding.  Screening Tests Health Maintenance  Topic Date Due   Zoster Vaccines- Shingrix (1 of 2) 10/09/1996   MAMMOGRAM  02/01/2021   FOOT EXAM  06/27/2021   INFLUENZA VACCINE  09/19/2021 (Originally 01/20/2021)   HEMOGLOBIN A1C  11/25/2021   OPHTHALMOLOGY EXAM  02/04/2022   TETANUS/TDAP  10/30/2024   COLONOSCOPY (Pts 45-475yrInsurance coverage will need to be confirmed)  09/18/2025   Pneumonia Vaccine 6550Years old  Completed   DEXA SCAN  Completed   COVID-19 Vaccine  Completed   Hepatitis C Screening  Completed   HPV VACCINES  Aged Out   Cancer Screenings: Lung: Low Dose CT Chest recommended if Age 75-80ears, 20 pack-year currently smoking OR have quit w/in 15years. Patient does not qualify. Breast:  Up to date on Mammogram? No  02/02/2019 plans to discuss with PCP. Up to date of Bone Density/Dexa? Yes Colorectal: UTD 09/19/2015  Additional Screenings: Hepatitis C Screening:  Completed  HIV Screening: Completed   Plan:  PCP apt due. Please call and schedule when you schedule allows. Discuss continuing mammograms with PCP.  Shingles vaccine due- you can get this at your local pharmacy.  Fill out an advance directive packet.   I have personally reviewed and noted the following in the patients chart:   Medical and social history Use of alcohol, tobacco or illicit drugs  Current medications and supplements Functional ability and status Nutritional status Physical activity Advanced directives List of other physicians Hospitalizations, surgeries, and ER visits in previous 12 months Vitals Screenings to include cognitive, depression, and falls Referrals and appointments  In addition, I have reviewed and discussed with patient certain preventive protocols, quality metrics, and best practice recommendations. A written personalized care plan for preventive services as well as general preventive health recommendations were provided to patient.  This visit was conducted virtually in the setting of the [place].    EmDorna BloomCMMarienville3/03/2022

## 2021-08-29 NOTE — Patient Instructions (Addendum)
Thank you for taking time to come for your Medicare Wellness Visit. I appreciate your ongoing commitment to your health goals. Please review the following plan we discussed and let me know if I can assist you in the future.  ?  ?These are the goals we discussed: ? ? Goals   ? ?   Weight (lb) < 140 lb (63.5 kg) (pt-stated)   ? ?  ? ?We also discussed recommended health maintenance. Please call our office and schedule a visit. As discussed, you are due for: ?Health Maintenance  ?Topic Date Due  ? Zoster Vaccines- Shingrix (1 of 2) 10/09/1996  ? MAMMOGRAM  02/01/2021  ? FOOT EXAM  06/27/2021  ? INFLUENZA VACCINE  09/19/2021 (Originally 01/20/2021)  ? HEMOGLOBIN A1C  11/25/2021  ? OPHTHALMOLOGY EXAM  02/04/2022  ? TETANUS/TDAP  10/30/2024  ? COLONOSCOPY (Pts 45-72yrs Insurance coverage will need to be confirmed)  09/18/2025  ? Pneumonia Vaccine 72+ Years old  Completed  ? DEXA SCAN  Completed  ? COVID-19 Vaccine  Completed  ? Hepatitis C Screening  Completed  ? HPV VACCINES  Aged Out  ? ?PCP apt due. Please call and schedule when you schedule allows. ?Discuss continuing mammograms with PCP.  ?Shingles vaccine due- you can get this at your local pharmacy.  ?Fill out an advance directive packet.  ? ?Preventive Care 53 Years and Older, Female ?Preventive care refers to lifestyle choices and visits with your health care provider that can promote health and wellness. Preventive care visits are also called wellness exams. ?What can I expect for my preventive care visit? ?Counseling ?Your health care provider may ask you questions about your: ?Medical history, including: ?Past medical problems. ?Family medical history. ?Pregnancy and menstrual history. ?History of falls. ?Current health, including: ?Memory and ability to understand (cognition). ?Emotional well-being. ?Home life and relationship well-being. ?Sexual activity and sexual health. ?Lifestyle, including: ?Alcohol, nicotine or tobacco, and drug use. ?Access to  firearms. ?Diet, exercise, and sleep habits. ?Work and work Statistician. ?Sunscreen use. ?Safety issues such as seatbelt and bike helmet use. ?Physical exam ?Your health care provider will check your: ?Height and weight. These may be used to calculate your BMI (body mass index). BMI is a measurement that tells if you are at a healthy weight. ?Waist circumference. This measures the distance around your waistline. This measurement also tells if you are at a healthy weight and may help predict your risk of certain diseases, such as type 2 diabetes and high blood pressure. ?Heart rate and blood pressure. ?Body temperature. ?Skin for abnormal spots. ?What immunizations do I need? ?Vaccines are usually given at various ages, according to a schedule. Your health care provider will recommend vaccines for you based on your age, medical history, and lifestyle or other factors, such as travel or where you work. ?What tests do I need? ?Screening ?Your health care provider may recommend screening tests for certain conditions. This may include: ?Lipid and cholesterol levels. ?Hepatitis C test. ?Hepatitis B test. ?HIV (human immunodeficiency virus) test. ?STI (sexually transmitted infection) testing, if you are at risk. ?Lung cancer screening. ?Colorectal cancer screening. ?Diabetes screening. This is done by checking your blood sugar (glucose) after you have not eaten for a while (fasting). ?Mammogram. Talk with your health care provider about how often you should have regular mammograms. ?BRCA-related cancer screening. This may be done if you have a family history of breast, ovarian, tubal, or peritoneal cancers. ?Bone density scan. This is done to screen for osteoporosis. ?Talk  with your health care provider about your test results, treatment options, and if necessary, the need for more tests. ?Follow these instructions at home: ?Eating and drinking ? ?Eat a diet that includes fresh fruits and vegetables, whole grains, lean  protein, and low-fat dairy products. Limit your intake of foods with high amounts of sugar, saturated fats, and salt. ?Take vitamin and mineral supplements as recommended by your health care provider. ?Do not drink alcohol if your health care provider tells you not to drink. ?If you drink alcohol: ?Limit how much you have to 0-1 drink a day. ?Know how much alcohol is in your drink. In the [place] one drink equals one 12 oz bottle of beer (355 mL), one 5 oz glass of wine (148 mL), or one 1? oz glass of hard liquor (44 mL). ?Lifestyle ?Brush your teeth every morning and night with fluoride toothpaste. Floss one time each day. ?Exercise for at least 30 minutes 5 or more days each week. ?Do not use any products that contain nicotine or tobacco. These products include cigarettes, chewing tobacco, and vaping devices, such as e-cigarettes. If you need help quitting, ask your health care provider. ?Do not use drugs. ?If you are sexually active, practice safe sex. Use a condom or other form of protection in order to prevent STIs. ?Take aspirin only as told by your health care provider. Make sure that you understand how much to take and what form to take. Work with your health care provider to find out whether it is safe and beneficial for you to take aspirin daily. ?Ask your health care provider if you need to take a cholesterol-lowering medicine (statin). ?Find healthy ways to manage stress, such as: ?Meditation, yoga, or listening to music. ?Journaling. ?Talking to a trusted person. ?Spending time with friends and family. ?Minimize exposure to UV radiation to reduce your risk of skin cancer. ?Safety ?Always wear your seat belt while driving or riding in a vehicle. ?Do not drive: ?If you have been drinking alcohol. Do not ride with someone who has been drinking. ?When you are tired or distracted. ?While texting. ?If you have been using any mind-altering substances or drugs. ?Wear a helmet and other protective equipment during  sports activities. ?If you have firearms in your house, make sure you follow all gun safety procedures. ?What's next? ?Visit your health care provider once a year for an annual wellness visit. ?Ask your health care provider how often you should have your eyes and teeth checked. ?Stay up to date on all vaccines. ?This information is not intended to replace advice given to you by your health care provider. Make sure you discuss any questions you have with your health care provider. ?Document Revised: 12/04/2020 Document Reviewed: 12/04/2020 ?Elsevier Patient Education ? [place]. ? ? ?Diet Recommendations for Diabetes  ? ?1. Eat at least 3 meals and 1-2 snacks per day. Never go more than 4-5 hours while awake without eating. Eat breakfast within the first hour of getting up.   ?2. Limit starchy foods to TWO per meal and ONE per snack. ONE portion of a starchy  food is equal to the following:  ? - ONE slice of bread (or its equivalent, such as half of a hamburger bun).  ? - 1/2 cup of a "scoopable" starchy food such as potatoes or rice.  ? - 15 grams of Total Carbohydrate as shown on food label.  ?3. Include at every meal: a protein food, a carb food, and vegetables and/or  fruit.  ? - Obtain twice the volume of vegetables as protein or carbohydrate foods for both lunch and dinner.  ? - Fresh or frozen vegetables are best.  ? - Keep frozen vegetables on hand for a quick vegetable serving.    ?  ? ?Starchy (carb) foods: Bread, rice, pasta, potatoes, corn, cereal, grits, crackers, bagels, muffins, all baked goods.  (Fruits, milk, and yogurt also have carbohydrate, but most of these foods will not spike your blood sugar as most starchy foods will.)  A few fruits do cause high blood sugars; use small portions of bananas (limit to 1/2 at a time), grapes, watermelon, oranges, and most tropical fruits.   ? ?Protein foods: Meat, fish, poultry, eggs, dairy foods, and beans such as pinto and kidney beans (beans also  provide carbohydrate).  ?  ?Our clinic's number is [phone]. Please call with questions or concerns about what we discussed today. ? ? ? ?

## 2021-09-01 NOTE — Progress Notes (Addendum)
I have reviewed this visit and agree with the documentation.   

## 2021-09-04 ENCOUNTER — Other Ambulatory Visit: Payer: Self-pay | Admitting: Family Medicine

## 2021-10-10 ENCOUNTER — Ambulatory Visit (INDEPENDENT_AMBULATORY_CARE_PROVIDER_SITE_OTHER): Payer: Medicare HMO | Admitting: Student

## 2021-10-10 ENCOUNTER — Encounter: Payer: Self-pay | Admitting: Student

## 2021-10-10 VITALS — BP 107/55 | HR 80 | Temp 98.1°F | Ht 62.0 in | Wt 146.4 lb

## 2021-10-10 DIAGNOSIS — J029 Acute pharyngitis, unspecified: Secondary | ICD-10-CM

## 2021-10-10 DIAGNOSIS — B349 Viral infection, unspecified: Secondary | ICD-10-CM

## 2021-10-10 DIAGNOSIS — R059 Cough, unspecified: Secondary | ICD-10-CM | POA: Diagnosis not present

## 2021-10-10 DIAGNOSIS — Z20822 Contact with and (suspected) exposure to covid-19: Secondary | ICD-10-CM

## 2021-10-10 LAB — POCT RAPID STREP A (OFFICE): Rapid Strep A Screen: NEGATIVE

## 2021-10-10 MED ORDER — BENZONATATE 100 MG PO CAPS: 100.0000 mg | ORAL_CAPSULE | Freq: Three times a day (TID) | ORAL | 0 refills | Status: DC | PRN

## 2021-10-10 NOTE — Progress Notes (Signed)
? ? ?  SUBJECTIVE:  ? ?CHIEF COMPLAINT / HPI:  ? ?76 year old female who presents with 2 days of cough and congestion.  No fever or chills  but other associated symptoms include sore throat, running nose and body ache which started just over 24hours. She has had decreased appetite but drinking adequately.  Known recent sick contact in grand kids who have running nose and cough last week. No ear pain, vomiting or diarrhea.  ? ?PERTINENT  PMH / PSH: HTN, T2DM, hepatic cirrhosis. ? ?OBJECTIVE:  ? ?BP (!) 107/55   Pulse 80   Temp 98.1 ?F (36.7 ?C)   Ht '5\' 2"'$  (1.575 m)   Wt 146 lb 6.4 oz (66.4 kg)   SpO2 100%   BMI 26.78 kg/m?   ? ? ?Physical Exam ?General: Alert, well appearing, NAD ?HENT: MMM, Pharyngeal erythema, right lumphoadenopathy ?Cardiovascular: RRR, No Murmurs, Normal S2/S2 ?Respiratory: CTAB, No wheezing or Rales ?Abdomen: No distension or tenderness ?Extremities: No edema on extremities   ? ?ASSESSMENT/PLAN:  ?Viral illness ?Patient presents with Fever, cough, congestion, rhinorrhea and sore throat. She is afebrile today and on exam has otopharyngeal erythema,  good work of breathing on RA and clear breath sounds bilaterally. Rapid strep test was negative. Overall presentation and exam is consistent with viral illness.  ?-Follow up with Quad viral panel ?-Rx Tessalon Perles ?-Discussed conservative management with patient. -Recommended tylenol or ibuprofen for fever. -Encouraged adequate hydration for patient.  ?-Outline signs and symptoms that will warrant ED visit or return for further assessment.   ?  ? ? ?Alen Bleacher, MD ?[facility]  ? ? ?

## 2021-10-10 NOTE — Patient Instructions (Addendum)
It was wonderful to meet you today. Thank you for allowing me to be a part of your care. Below is a short summary of what we discussed at your visit today: ? ?Your Rapid strep test was negative ? ?Your symptoms are consistent with viral illness. ? ?I recommend staying hydrated and drinking adequate fluid. ? ?Sent in prescription for the Tessalon Perles ? ? ?If you have any questions or concerns, please do not hesitate to contact us via phone or MyChart message.  ? ?Alen Bleacher, MD ?[facility]  ?

## 2021-10-11 ENCOUNTER — Encounter: Payer: Self-pay | Admitting: Student

## 2021-10-11 LAB — COVID-19, FLU A+B NAA
Influenza A, NAA: NOT DETECTED
Influenza B, NAA: NOT DETECTED
SARS-CoV-2, NAA: NOT DETECTED

## 2021-10-16 ENCOUNTER — Ambulatory Visit (INDEPENDENT_AMBULATORY_CARE_PROVIDER_SITE_OTHER): Payer: Medicare HMO | Admitting: Podiatry

## 2021-10-16 DIAGNOSIS — K7460 Unspecified cirrhosis of liver: Secondary | ICD-10-CM | POA: Diagnosis not present

## 2021-10-16 DIAGNOSIS — L84 Corns and callosities: Secondary | ICD-10-CM | POA: Diagnosis not present

## 2021-10-16 DIAGNOSIS — E1142 Type 2 diabetes mellitus with diabetic polyneuropathy: Secondary | ICD-10-CM

## 2021-10-16 DIAGNOSIS — M2041 Other hammer toe(s) (acquired), right foot: Secondary | ICD-10-CM

## 2021-10-16 NOTE — Progress Notes (Signed)
?  Subjective:  ?Patient ID: Becky Fields, female    DOB: 1946-02-23,  MRN: [id number] ? ?Chief Complaint  ?Patient presents with  ? Nail Problem  ?  Thick painful toenails, 3 month follow up   ? ? ?76 y.o. female presents with the above complaint. History confirmed with patient.  She is doing well the calluses have returned again she has a very painful callus on the tip of the right third toe ? ?Objective:  ?Physical Exam: ?warm, good capillary refill, no trophic changes or ulcerative lesions, normal DP and PT pulses and normal sensory exam.   ? ?Bilateral submetatarsal 1 callus ?Left foot: Medial pinch callus ?Right Foot: Plantar IPJ callus hallux, contracted hammertoe digit of third toe with preulcerative callus distal tip that is painful.  Toe is semireducible ? ?Assessment:  ? ?1. Hammertoe of right foot   ?2. Callus of foot   ?3. Type 2 diabetes mellitus with peripheral neuropathy (HCC)   ? ? ? ? ?Plan:  ?Patient was evaluated and treated and all questions answered. ? ? ? ?Patient educated on diabetes. Discussed proper diabetic foot care and discussed risks and complications of disease. Educated patient in depth on reasons to return to the office immediately should he/she discover anything concerning or new on the feet. All questions answered. Discussed proper shoes as well.  ? ?All symptomatic hyperkeratoses were safely debrided with a sterile #15 blade to patient's level of comfort without incident. We discussed preventative and palliative care of these lesions including supportive and accommodative shoegear, padding, prefabricated and  ? ?For the preulcerative callus on the right third toe I discussed with her this is secondary to the deformity she has of the contracted hammertoe.  This is becoming quite painful.  No history of ulceration here.  We discussed the option of a corrective flexor tenotomy versus arthroplasty of the toe and what each of these would entail.  She is still working and would prefer to  avoid surgery that would have to take off time out of work.  I discussed with her if not improving with padding and offloading I dispensed a silicone toe crest today we could do a flexor tenotomy in the office in only missed a couple of days of work.  I will see her back at her regular visit sooner if the callus becomes more of an issue ? ?Return in about 3 months (around 01/15/2022) for at risk diabetic foot care for calluses.  ? ?

## 2021-10-29 ENCOUNTER — Other Ambulatory Visit: Payer: Medicare HMO

## 2021-11-05 ENCOUNTER — Other Ambulatory Visit: Payer: Self-pay | Admitting: Family Medicine

## 2021-11-05 ENCOUNTER — Other Ambulatory Visit: Payer: Medicare HMO

## 2021-11-05 DIAGNOSIS — E781 Pure hyperglyceridemia: Secondary | ICD-10-CM

## 2021-11-11 ENCOUNTER — Ambulatory Visit: Payer: Medicare HMO

## 2021-11-11 DIAGNOSIS — M2041 Other hammer toe(s) (acquired), right foot: Secondary | ICD-10-CM

## 2021-11-11 DIAGNOSIS — M2141 Flat foot [pes planus] (acquired), right foot: Secondary | ICD-10-CM

## 2021-11-11 DIAGNOSIS — E1142 Type 2 diabetes mellitus with diabetic polyneuropathy: Secondary | ICD-10-CM

## 2021-11-11 NOTE — Progress Notes (Signed)
SITUATION Reason for Consult: Evaluation for Prefabricated Diabetic Shoes and Custom Diabetic Inserts. Patient / Caregiver Report: Patient would like well fitting shoes  OBJECTIVE DATA: Patient History / Diagnosis:    ICD-10-CM   1. Type 2 diabetes mellitus with peripheral neuropathy (HCC)  E11.42     2. Hammertoe of right foot  M20.41     3. Pes planus of both feet  M21.41    M21.42       Physician Treating Diabetes:  Zenia Resides MD  Current or Previous Devices:   Historical user  In-Person Foot Examination: Ulcers & Callousing:   None Deformities:    Pes planus, hammertoes Sensation:    Compromised  Shoe Size:     8W  ORTHOTIC RECOMMENDATION Recommended Devices: - 1x pair prefabricated PDAC approved diabetic shoes; Patient Selected [employer] 844 Grey Size 8W - 3x pair custom-to-patient PDAC approved vacuum formed diabetic insoles.  GOALS OF SHOES AND INSOLES - Reduce shear and pressure - Reduce / Prevent callus formation - Reduce / Prevent ulceration - Protect the fragile healing compromised diabetic foot.  Patient would benefit from diabetic shoes and inserts as patient has diabetes mellitus and the patient has one or more of the following conditions: - History of partial or complete amputation of the foot - History of previous foot ulceration. - History of pre-ulcerative callus - Peripheral neuropathy with evidence of callus formation - Foot deformity - Poor circulation  ACTIONS PERFORMED Potential out of pocket cost was communicated to patient. Patient understood and consented to measurement and casting. Patient was casted for insoles via crush box and measured for shoes via brannock device. Procedure was explained and patient tolerated procedure well. All questions were answered and concerns addressed. CMN Submitted to treating physician. Casts were shipped to central fabrication for HOLD until Certificate of Medical Necessity or otherwise necessary  authorization from insurance is obtained.  PLAN Shoes are to be ordered and casts released from hold once all appropriate paperwork is complete. Patient is to be contacted and scheduled for fitting once shoes and insoles have been fabricated and received.

## 2021-11-24 ENCOUNTER — Telehealth: Payer: Self-pay

## 2021-11-24 NOTE — Telephone Encounter (Signed)
CMN received - casts released from hold and shoes ordered  Orthofeet 844 8W

## 2021-11-25 ENCOUNTER — Encounter: Payer: Self-pay | Admitting: *Deleted

## 2021-12-08 ENCOUNTER — Telehealth: Payer: Self-pay | Admitting: *Deleted

## 2021-12-08 DIAGNOSIS — E2839 Other primary ovarian failure: Secondary | ICD-10-CM

## 2021-12-08 NOTE — Telephone Encounter (Signed)
Patient left a message and said that she never did her bone density scan last year and it is now expired.  She also states that she had a fall about a month ago and is having soreness still in her tailbone and would like to get an xray .  Will forward to MD. Johnney Ou

## 2021-12-08 NOTE — Telephone Encounter (Signed)
Called and discussed.   Reviewed my last note of 11/2020.   Also reviewed CT LS spine of 6//2022. No recent A1C.   She complains of a lot of back stiffness "near my tailbone."  Nothing acutely worsening, just slowly progressive. Because she is overdue for an appointment, she will see me.  I ordered a bone density scan.  Further labs/Xrays to wait until visit.

## 2021-12-19 ENCOUNTER — Telehealth: Payer: Self-pay

## 2021-12-19 NOTE — Telephone Encounter (Signed)
Patient calls nurse line regarding scheduling for bone density scan. Patient states that she called [facility], however, they are booked out until December. Patient is asking if there are any other offices that provider would recommend her having this scan done at.   Please advise.   Talbot Grumbling, RN

## 2021-12-22 NOTE — Telephone Encounter (Signed)
Lm for patient letting her know that she can check with [facility] for scheduling.  They also perform dexa scans but I am unsure of their availability.   [facility] [address] [place],  [place]  [address] Main: [phone]

## 2021-12-24 ENCOUNTER — Other Ambulatory Visit: Payer: Self-pay | Admitting: Family Medicine

## 2021-12-24 ENCOUNTER — Ambulatory Visit (HOSPITAL_BASED_OUTPATIENT_CLINIC_OR_DEPARTMENT_OTHER)
Admission: RE | Admit: 2021-12-24 | Discharge: 2021-12-24 | Disposition: A | Discharge status: Home / Self Care | Payer: Medicare HMO | Source: Ambulatory Visit | Attending: Family Medicine | Admitting: Family Medicine

## 2021-12-24 DIAGNOSIS — Z780 Asymptomatic menopausal state: Secondary | ICD-10-CM | POA: Diagnosis not present

## 2021-12-24 DIAGNOSIS — E781 Pure hyperglyceridemia: Secondary | ICD-10-CM

## 2021-12-24 DIAGNOSIS — E2839 Other primary ovarian failure: Secondary | ICD-10-CM | POA: Insufficient documentation

## 2021-12-31 ENCOUNTER — Other Ambulatory Visit: Payer: Medicare HMO

## 2021-12-31 DIAGNOSIS — E781 Pure hyperglyceridemia: Secondary | ICD-10-CM | POA: Diagnosis not present

## 2022-01-01 LAB — LIPID PANEL
Chol/HDL Ratio: 3.3 ratio (ref 0.0–4.4)
Cholesterol, Total: 157 mg/dL (ref 100–199)
HDL: 47 mg/dL (ref >39)
LDL Chol Calc (NIH): 80 mg/dL (ref 0–99)
Triglycerides: 175 mg/dL — ABNORMAL HIGH (ref 0–149)
VLDL Cholesterol Cal: 30 mg/dL (ref 5–40)

## 2022-01-08 ENCOUNTER — Ambulatory Visit (INDEPENDENT_AMBULATORY_CARE_PROVIDER_SITE_OTHER): Payer: Medicare HMO | Admitting: Family Medicine

## 2022-01-08 ENCOUNTER — Encounter: Payer: Self-pay | Admitting: Family Medicine

## 2022-01-08 DIAGNOSIS — M545 Low back pain, unspecified: Secondary | ICD-10-CM | POA: Diagnosis not present

## 2022-01-08 DIAGNOSIS — E1142 Type 2 diabetes mellitus with diabetic polyneuropathy: Secondary | ICD-10-CM

## 2022-01-08 DIAGNOSIS — G8929 Other chronic pain: Secondary | ICD-10-CM | POA: Diagnosis not present

## 2022-01-08 DIAGNOSIS — B182 Chronic viral hepatitis C: Secondary | ICD-10-CM | POA: Diagnosis not present

## 2022-01-08 DIAGNOSIS — I10 Essential (primary) hypertension: Secondary | ICD-10-CM

## 2022-01-08 MED ORDER — METFORMIN HCL ER 500 MG PO TB24: 500.0000 mg | ORAL_TABLET | Freq: Every day | ORAL | Status: DC

## 2022-01-08 NOTE — Assessment & Plan Note (Addendum)
Check A1C On statin with LDL less than 100.  Not wanting higher dose statin. I realized that I had not ordered an A1C.  I confessed and apologized when I call with other lab results.  I ordered as a future lab.  She will come in for a lab visit in the next week or so to get the A1C

## 2022-01-08 NOTE — Assessment & Plan Note (Signed)
Well controled on current meds 

## 2022-01-08 NOTE — Assessment & Plan Note (Signed)
MSK pain.  Not coccydynea.  Conservative care.

## 2022-01-08 NOTE — Progress Notes (Signed)
    SUBJECTIVE:   CHIEF COMPLAINT / HPI:   Several issues: Hypertension.  Tolerating current meds.  No CP or SOB. Diabetes: only taking one metformin per day.  Good diet and exercise.  Nice weight loss.  She is not interested in newer diabetic medications. Hx of Hep C.  Will be due for annual RUQ ultrasound for hepatocellular carcinoma in Oct.   Left leg wound from injury.  Got red and angry.  Now healing slowly.  Using neosporin.   Pain in buttocks area High chol.  LDL=80 which is less than 100 but above 70.  She is not interested in higher dose of statin. Diabetic neuropathy.  Now followed by podiatry for diabetic foot exams.    OBJECTIVE:   BP 132/63   Pulse 61   Ht '5\' 2"'$  (1.575 m)   Wt 144 lb 12.8 oz (65.7 kg)   SpO2 98%   BMI 26.48 kg/m   Lungs clear Cardiac RRR without m or g Pelvis.  Buttocks pain is mid to proximal sacrum at midline.  Well above coccyx.   Left leg.  Mildly red abrasion/laceration of left ant tibea.  ASSESSMENT/PLAN:   Type 2 diabetes mellitus with peripheral neuropathy (HCC) Check A1C On statin with LDL less than 100.  Not wanting higher dose statin.  Chronic hepatitis C without hepatic coma (HCC) RUQ ultrasound in Oct.  HYPERTENSION, BENIGN ESSENTIAL Well controled on current meds.  Low back pain MSK pain.  Not coccydynea.  Conservative care.     Zenia Resides, MD [place]

## 2022-01-08 NOTE — Assessment & Plan Note (Signed)
RUQ ultrasound in Oct.

## 2022-01-08 NOTE — Patient Instructions (Addendum)
I ordered the ultrasound.  It should not be done until mid Oct.   Bactroban, mupirocin is the antibiotic cream to use on your leg. I will call with lab test results. Great job with the weight loss Please get the flu shot and covid booster in the fall. See me in six months.

## 2022-01-09 LAB — CMP14+EGFR
ALT: 14 IU/L (ref 0–32)
AST: 14 IU/L (ref 0–40)
Albumin/Globulin Ratio: 1.6 (ref 1.2–2.2)
Albumin: 4.2 g/dL (ref 3.8–4.8)
Alkaline Phosphatase: 69 IU/L (ref 44–121)
BUN/Creatinine Ratio: 28 (ref 12–28)
BUN: 21 mg/dL (ref 8–27)
Bilirubin Total: 0.5 mg/dL (ref 0.0–1.2)
CO2: 22 mmol/L (ref 20–29)
Calcium: 8.7 mg/dL (ref 8.7–10.3)
Chloride: 105 mmol/L (ref 96–106)
Creatinine, Ser: 0.74 mg/dL (ref 0.57–1.00)
Globulin, Total: 2.6 g/dL (ref 1.5–4.5)
Glucose: 116 mg/dL — ABNORMAL HIGH (ref 70–99)
Potassium: 4.2 mmol/L (ref 3.5–5.2)
Sodium: 140 mmol/L (ref 134–144)
Total Protein: 6.8 g/dL (ref 6.0–8.5)
eGFR: 84 mL/min/1.73

## 2022-01-09 NOTE — Addendum Note (Signed)
Addended by: Zenia Resides on: 01/09/2022 11:07 AM   Modules accepted: Orders

## 2022-01-12 ENCOUNTER — Telehealth: Payer: Self-pay | Admitting: *Deleted

## 2022-01-12 NOTE — Telephone Encounter (Signed)
LVM for pt to call office to be sure she was aware of her appointment scheduled at [facility] in October.  It appears that they contacted pt to schedule but wanted to be sure she had the date and time. Rajah Lamba Zimmerman Rumple, CMA

## 2022-01-15 ENCOUNTER — Other Ambulatory Visit (INDEPENDENT_AMBULATORY_CARE_PROVIDER_SITE_OTHER): Payer: Medicare HMO

## 2022-01-15 DIAGNOSIS — E1142 Type 2 diabetes mellitus with diabetic polyneuropathy: Secondary | ICD-10-CM

## 2022-01-15 LAB — POCT GLYCOSYLATED HEMOGLOBIN (HGB A1C): HbA1c, POC (controlled diabetic range): 6.3 % (ref 0.0–7.0)

## 2022-01-19 ENCOUNTER — Ambulatory Visit (INDEPENDENT_AMBULATORY_CARE_PROVIDER_SITE_OTHER): Payer: Medicare HMO | Admitting: Podiatry

## 2022-01-19 DIAGNOSIS — L84 Corns and callosities: Secondary | ICD-10-CM

## 2022-01-19 DIAGNOSIS — E1142 Type 2 diabetes mellitus with diabetic polyneuropathy: Secondary | ICD-10-CM | POA: Diagnosis not present

## 2022-01-19 NOTE — Progress Notes (Signed)
  Subjective:  Patient ID: Becky Fields, female    DOB: 1946-02-15,  MRN: [id number]  Chief Complaint  Patient presents with   Callouses    Callus to 3rd toe on right foot. Patient is diabetic.     76 y.o. female presents with the above complaint. History confirmed with patient.  She is doing well the calluses have returned again she has a very painful callus on the tip of the right third toe  Objective:  Physical Exam: warm, good capillary refill, no trophic changes or ulcerative lesions, normal DP and PT pulses and normal sensory exam.    Bilateral submetatarsal 1 callus Left foot: Medial pinch callus Right Foot: Plantar IPJ callus hallux, contracted hammertoe digit of third toe with preulcerative callus distal tip that is painful.  Toe is semireducible  Assessment:   1. Type 2 diabetes mellitus with peripheral neuropathy (HCC)   2. Callus of foot        Plan:  Patient was evaluated and treated and all questions answered.    Patient educated on diabetes. Discussed proper diabetic foot care and discussed risks and complications of disease. Educated patient in depth on reasons to return to the office immediately should he/she discover anything concerning or new on the feet. All questions answered. Discussed proper shoes as well.   All symptomatic hyperkeratoses were safely debrided with a sterile #15 blade to patient's level of comfort without incident. We discussed preventative and palliative care of these lesions including supportive and accommodative shoegear, padding, prefabricated and custom molded orthoses and offloading silicone pads.  She is also using urea cream on this that has helped  Ultimately she likely will need hammertoe correction of the right third toe to alleviate the painful lesion, this could be a flexor tenotomy or possibly arthroplasty.  She would like to hold off until she retires.  We will revisit this in the future  Return in about 3 months (around  04/21/2022) for at risk diabetic foot care.

## 2022-02-13 ENCOUNTER — Encounter: Payer: Self-pay | Admitting: Podiatry

## 2022-02-13 ENCOUNTER — Telehealth: Payer: Self-pay | Admitting: Podiatry

## 2022-02-13 NOTE — Telephone Encounter (Signed)
Left message to advise that her shoes are in but we need to receive updated paperwork from Dr. Andria Frames that we will resend. Once we receive we will reach out to schedule her to come in and pickup shoes

## 2022-02-20 ENCOUNTER — Other Ambulatory Visit: Payer: Self-pay | Admitting: Family Medicine

## 2022-02-20 DIAGNOSIS — E1142 Type 2 diabetes mellitus with diabetic polyneuropathy: Secondary | ICD-10-CM

## 2022-02-20 DIAGNOSIS — I10 Essential (primary) hypertension: Secondary | ICD-10-CM

## 2022-04-20 ENCOUNTER — Ambulatory Visit
Admission: RE | Admit: 2022-04-20 | Discharge: 2022-04-20 | Disposition: A | Discharge status: Home / Self Care | Payer: Medicare HMO | Source: Ambulatory Visit | Attending: Family Medicine | Admitting: Family Medicine

## 2022-04-20 DIAGNOSIS — B182 Chronic viral hepatitis C: Secondary | ICD-10-CM | POA: Diagnosis not present

## 2022-04-20 DIAGNOSIS — Z9049 Acquired absence of other specified parts of digestive tract: Secondary | ICD-10-CM | POA: Diagnosis not present

## 2022-04-21 ENCOUNTER — Other Ambulatory Visit: Payer: Self-pay | Admitting: Family Medicine

## 2022-04-21 ENCOUNTER — Ambulatory Visit: Payer: Medicare HMO | Admitting: Podiatry

## 2022-04-21 DIAGNOSIS — G8929 Other chronic pain: Secondary | ICD-10-CM

## 2022-04-24 ENCOUNTER — Other Ambulatory Visit: Payer: Self-pay | Admitting: Family Medicine

## 2022-04-24 DIAGNOSIS — E781 Pure hyperglyceridemia: Secondary | ICD-10-CM

## 2022-04-27 ENCOUNTER — Telehealth: Payer: Self-pay | Admitting: Family Medicine

## 2022-04-27 ENCOUNTER — Ambulatory Visit (INDEPENDENT_AMBULATORY_CARE_PROVIDER_SITE_OTHER): Payer: Medicare HMO | Admitting: Podiatry

## 2022-04-27 DIAGNOSIS — L84 Corns and callosities: Secondary | ICD-10-CM

## 2022-04-27 DIAGNOSIS — M2041 Other hammer toe(s) (acquired), right foot: Secondary | ICD-10-CM

## 2022-04-27 DIAGNOSIS — E1142 Type 2 diabetes mellitus with diabetic polyneuropathy: Secondary | ICD-10-CM | POA: Diagnosis not present

## 2022-04-27 NOTE — Telephone Encounter (Signed)
Patient dropped off form at front desk for [facility] and Statement form Certifying Physician for Therapeutic Shoes.  Verified that patient section of form has been completed.  Last DOS/WCC with PCP was 01/08/22.  Placed form in green team folder to be completed by clinical staff.  Creig Hines

## 2022-04-27 NOTE — Progress Notes (Signed)
  Subjective:  Patient ID: Becky Fields, female    DOB: Oct 28, 1945,  MRN: [id number]  Chief Complaint  Patient presents with   Diabetes    Preulcerative calluses    76 y.o. female presents with the above complaint. History confirmed with patient.  Overall doing well the callus has built up again  Objective:  Physical Exam: warm, good capillary refill, no trophic changes or ulcerative lesions, normal DP and PT pulses and normal sensory exam.    Bilateral submetatarsal 1 callus Left foot: Medial pinch callus Right Foot: Plantar IPJ callus hallux, contracted hammertoe digit of third toe with preulcerative callus distal tip that is painful.  Toe is semireducible  Assessment:   1. Callus of foot   2. Type 2 diabetes mellitus with peripheral neuropathy (HCC)   3. Hammertoe of right foot         Plan:  Patient was evaluated and treated and all questions answered.    Patient educated on diabetes. Discussed proper diabetic foot care and discussed risks and complications of disease. Educated patient in depth on reasons to return to the office immediately should he/she discover anything concerning or new on the feet. All questions answered. Discussed proper shoes as well.   All symptomatic hyperkeratoses were safely debrided with a sterile #15 blade to patient's level of comfort without incident. We discussed preventative and palliative care of these lesions including supportive and accommodative shoegear, padding, prefabricated and custom molded orthoses and offloading silicone pads.  She is also using urea cream on this that has helped    Return in about 3 months (around 07/28/2022) for at risk diabetic foot care.

## 2022-04-30 NOTE — Telephone Encounter (Signed)
Clinical info completed on Diabetic Shoes form.  Placed form in Dr. Lowella Bandy box for completion.    When form is completed, please route note to "RN Team" and place in wall pocket in front office.   Ottis Stain, CMA

## 2022-05-05 DIAGNOSIS — H5203 Hypermetropia, bilateral: Secondary | ICD-10-CM | POA: Diagnosis not present

## 2022-05-06 NOTE — Telephone Encounter (Signed)
Form completed and placed in RN box

## 2022-05-08 ENCOUNTER — Ambulatory Visit
Admission: RE | Admit: 2022-05-08 | Discharge: 2022-05-08 | Disposition: A | Discharge status: Home / Self Care | Payer: Medicare HMO | Source: Ambulatory Visit | Attending: Family Medicine | Admitting: Family Medicine

## 2022-05-08 DIAGNOSIS — M4316 Spondylolisthesis, lumbar region: Secondary | ICD-10-CM | POA: Diagnosis not present

## 2022-05-08 DIAGNOSIS — M545 Low back pain, unspecified: Secondary | ICD-10-CM | POA: Diagnosis not present

## 2022-05-08 DIAGNOSIS — G8929 Other chronic pain: Secondary | ICD-10-CM

## 2022-05-08 DIAGNOSIS — R200 Anesthesia of skin: Secondary | ICD-10-CM | POA: Diagnosis not present

## 2022-05-08 NOTE — Telephone Encounter (Signed)
Form faxed to [place] and [place].   Copy made for batch scanning.

## 2022-05-18 ENCOUNTER — Encounter: Payer: Self-pay | Admitting: Family Medicine

## 2022-05-20 ENCOUNTER — Ambulatory Visit (INDEPENDENT_AMBULATORY_CARE_PROVIDER_SITE_OTHER): Payer: Medicare HMO

## 2022-05-20 DIAGNOSIS — E1142 Type 2 diabetes mellitus with diabetic polyneuropathy: Secondary | ICD-10-CM

## 2022-05-20 DIAGNOSIS — M2142 Flat foot [pes planus] (acquired), left foot: Secondary | ICD-10-CM

## 2022-05-20 DIAGNOSIS — M2141 Flat foot [pes planus] (acquired), right foot: Secondary | ICD-10-CM

## 2022-05-20 NOTE — Progress Notes (Signed)
Patient presents today to pick up diabetic shoes and insoles.  Patient was dispensed 1 pair of diabetic shoes and 3 pairs of foam casted diabetic insoles. Fit was satisfactory. Instructions for break-in and wear was reviewed and a copy was given to the patient.   Re-appointment for regularly scheduled diabetic foot care visits or if they should experience any trouble with the shoes or insoles.  

## 2022-05-29 LAB — HM DIABETES EYE EXAM

## 2022-07-15 ENCOUNTER — Encounter: Payer: Self-pay | Admitting: Family Medicine

## 2022-07-15 ENCOUNTER — Ambulatory Visit (INDEPENDENT_AMBULATORY_CARE_PROVIDER_SITE_OTHER): Payer: Medicare HMO | Admitting: Family Medicine

## 2022-07-15 VITALS — BP 110/64 | HR 76 | Wt 139.0 lb

## 2022-07-15 DIAGNOSIS — I10 Essential (primary) hypertension: Secondary | ICD-10-CM

## 2022-07-15 DIAGNOSIS — K7460 Unspecified cirrhosis of liver: Secondary | ICD-10-CM | POA: Diagnosis not present

## 2022-07-15 DIAGNOSIS — E1142 Type 2 diabetes mellitus with diabetic polyneuropathy: Secondary | ICD-10-CM

## 2022-07-15 LAB — POCT GLYCOSYLATED HEMOGLOBIN (HGB A1C): HbA1c, POC (controlled diabetic range): 6.3 % (ref 0.0–7.0)

## 2022-07-15 NOTE — Progress Notes (Signed)
    SUBJECTIVE:   CHIEF COMPLAINT / HPI:   FU diabetes.  Doing well in many respects.  A1C today is great at 6.3.  She is delighted that she met her weight goal of being below 140 with clothes on.   Neuropathy: followed by podiatry who does foot care and foot exams.   Hypertension.  No chest pain, SOB or lightheadedness. Chronic Hep C treated.  Needs annual ultrasound.  Last done 10/23    OBJECTIVE:   BP 110/64   Pulse 76   Wt 139 lb (63 kg)   SpO2 99%   BMI 25.42 kg/m   Lungs clear Cardiac RRR without m or g  ASSESSMENT/PLAN:   Type 2 diabetes mellitus with peripheral neuropathy (HCC) Great control.  Check urine microalbumin.  Consider SGLT2.  She does not want more meds and cannot afford higher cost.  We may try.  If insurance covers, consider stop metformin once up on jardiance dose.  HYPERTENSION, BENIGN ESSENTIAL Great control, no change.  Hepatic cirrhosis She is aware she will need another ultrasound next Oct.     Becky Resides, MD [place]

## 2022-07-15 NOTE — Patient Instructions (Signed)
Congrats on meeting your weight goal.  You have done it right. Your A1C is great.  No change needed.   We can talk about the new heart/kidney protective diabetes medicine when I call with urine results.  The drug is Jardiance.  The class of drugs are SGLT2 inhibitors if you want to google.  Maybe, if we got you onit, we could stop the meformin. Your new doctor will be Dr. Ky Barban.  She starts in June.  I think you will like her please give her a try. See her in June or July.

## 2022-07-15 NOTE — Assessment & Plan Note (Signed)
Great control, no change.

## 2022-07-15 NOTE — Assessment & Plan Note (Signed)
She is aware she will need another ultrasound next Oct.

## 2022-07-15 NOTE — Assessment & Plan Note (Signed)
Great control.  Check urine microalbumin.  Consider SGLT2.  She does not want more meds and cannot afford higher cost.  We may try.  If insurance covers, consider stop metformin once up on jardiance dose.

## 2022-07-16 LAB — MICROALBUMIN / CREATININE URINE RATIO
Creatinine, Urine: 107.5 mg/dL
Microalb/Creat Ratio: 5 mg/g creat (ref 0–29)
Microalbumin, Urine: 5.8 ug/mL

## 2022-07-28 ENCOUNTER — Encounter: Payer: Self-pay | Admitting: Family Medicine

## 2022-08-12 ENCOUNTER — Ambulatory Visit (INDEPENDENT_AMBULATORY_CARE_PROVIDER_SITE_OTHER): Payer: Medicare HMO | Admitting: Podiatry

## 2022-08-12 DIAGNOSIS — E1142 Type 2 diabetes mellitus with diabetic polyneuropathy: Secondary | ICD-10-CM | POA: Diagnosis not present

## 2022-08-12 DIAGNOSIS — L84 Corns and callosities: Secondary | ICD-10-CM

## 2022-08-12 DIAGNOSIS — M79674 Pain in right toe(s): Secondary | ICD-10-CM | POA: Diagnosis not present

## 2022-08-12 DIAGNOSIS — M79675 Pain in left toe(s): Secondary | ICD-10-CM | POA: Diagnosis not present

## 2022-08-12 DIAGNOSIS — B351 Tinea unguium: Secondary | ICD-10-CM

## 2022-08-12 NOTE — Progress Notes (Signed)
  Subjective:  Patient ID: Becky Fields, female    DOB: 1945-10-02,  MRN: [id number]  Chief Complaint  Patient presents with   Diabetes    Toe nails, and routine foot care    77 y.o. female presents with the above complaint. History confirmed with patient.  Overall doing well the callus has built up again, she is still interested in straightening the toe at some point.  Her nails are getting thick and elongated as well and causing discomfort.  Objective:  Physical Exam: warm, good capillary refill, no trophic changes or ulcerative lesions, normal DP and PT pulses and normal sensory exam.    Bilateral submetatarsal 1 callus Left foot: Medial pinch callus, thickened elongated discolored dystrophic nails with subungual debris Right Foot: Plantar IPJ callus hallux, contracted hammertoe digit of third toe with preulcerative callus distal tip that is painful.  Toe is semireducible, thickened elongated discolored dystrophic nails with subungual debris  Assessment:   1. Type 2 diabetes mellitus with peripheral neuropathy (HCC)   2. Callus of foot   3. Pain due to onychomycosis of toenails of both feet         Plan:  Patient was evaluated and treated and all questions answered.    Patient educated on diabetes. Discussed proper diabetic foot care and discussed risks and complications of disease. Educated patient in depth on reasons to return to the office immediately should he/she discover anything concerning or new on the feet. All questions answered. Discussed proper shoes as well.   All symptomatic hyperkeratoses were safely debrided with a sterile #15 blade to patient's level of comfort without incident. We discussed preventative and palliative care of these lesions including supportive and accommodative shoegear, padding, prefabricated and custom molded orthoses and offloading silicone pads.  She is also using urea cream on this that has helped  Discussed the etiology and treatment  options for the condition in detail with the patient. Recommended debridement of the nails today. Sharp and mechanical debridement performed of all painful and mycotic nails today. Nails debrided in length and thickness using a nail nipper to level of comfort. Discussed treatment options including appropriate shoe gear. Follow up as needed for painful nails.    Return in about 3 months (around 11/10/2022) for at risk diabetic foot care.

## 2022-08-23 ENCOUNTER — Other Ambulatory Visit: Payer: Self-pay | Admitting: Family Medicine

## 2022-08-23 DIAGNOSIS — E1142 Type 2 diabetes mellitus with diabetic polyneuropathy: Secondary | ICD-10-CM

## 2022-10-28 ENCOUNTER — Telehealth: Payer: Self-pay | Admitting: Family Medicine

## 2022-10-28 NOTE — Telephone Encounter (Signed)
Contacted Tamiera Cardullo to schedule their annual wellness visit. Appointment made for 10/30/2022.  Thank you,  [facility] Support [facility] Direct dial  [phone]

## 2022-10-30 ENCOUNTER — Ambulatory Visit (INDEPENDENT_AMBULATORY_CARE_PROVIDER_SITE_OTHER): Payer: Medicare HMO

## 2022-10-30 DIAGNOSIS — Z0000 Encounter for general adult medical examination without abnormal findings: Secondary | ICD-10-CM

## 2022-10-30 NOTE — Progress Notes (Signed)
I connected with  Priscille Kluver on 10/30/22 by a audio enabled telemedicine application and verified that I am speaking with the correct person using two identifiers.  Patient Location: Home  Provider Location: Home Office  I discussed the limitations of evaluation and management by telemedicine. The patient expressed understanding and agreed to proceed.   Subjective:   Becky Fields is a 77 y.o. female who presents for Medicare Annual (Subsequent) preventive examination.  Review of Systems    Per HPI unless specifically indicated below. Cardiac Risk Factors include: advanced age (>38men, >72 women);female gender, Hypertension, and Hypertriglyceridemia.           Objective:       07/15/2022    8:55 AM 01/08/2022    8:21 AM 10/10/2021   10:51 AM  Vitals with BMI  Height  5\' 2"  5\' 2"   Weight 139 lbs 144 lbs 13 oz 146 lbs 6 oz  BMI  26.48 26.77  Systolic 110 132 440  Diastolic 64 63 55  Pulse 76 61 80    Today's Vitals   10/30/22 1013  PainSc: 7    There is no height or weight on file to calculate BMI.     10/30/2022   10:18 AM 07/15/2022    8:53 AM 07/15/2022    8:47 AM 10/10/2021   10:53 AM 08/27/2021    3:26 PM 12/30/2020    2:03 PM 12/25/2020   11:38 AM  Advanced Directives  Does Patient Have a Medical Advance Directive? Yes Yes No No No No No  Type of Estate agent of [place];Living will Living will;Healthcare Power of Attorney       Does patient want to make changes to medical advance directive? No - Patient declined        Copy of Healthcare Power of Attorney in Chart? Yes - validated most recent copy scanned in chart (See row information) Yes - validated most recent copy scanned in chart (See row information)       Would patient like information on creating a medical advance directive?   No - Patient declined Yes (MAU/Ambulatory/Procedural Areas - Information given) Yes (MAU/Ambulatory/Procedural Areas - Information given)  Yes  (MAU/Ambulatory/Procedural Areas - Information given)    Current Medications (verified) Outpatient Encounter Medications as of 10/30/2022  Medication Sig   losartan (COZAAR) 100 MG tablet Take 1 tablet by mouth once daily   metFORMIN (GLUCOPHAGE-XR) 500 MG 24 hr tablet TAKE 2 TABLETS BY MOUTH ONCE DAILY WITH BREAKFAST (Patient taking differently: 500 mg.)   metoprolol succinate (TOPROL-XL) 50 MG 24 hr tablet Take 1 tablet by mouth in the evening   pravastatin (PRAVACHOL) 40 MG tablet Take 1 tablet by mouth once daily   No facility-administered encounter medications on file as of 10/30/2022.    Allergies (verified) Lisinopril and Sulfa antibiotics   History: Past Medical History:  Diagnosis Date   Cataract    Diabetes mellitus    Ejection fraction    .   Hepatitis C    Hypertension    Hypertriglyceridemia    Pneumonia    Takotsubo syndrome    October 16, 2012   Past Surgical History:  Procedure Laterality Date   CHOLECYSTECTOMY     COLONOSCOPY     EYE SURGERY  02/2012   bilateral cataract surg   LEFT HEART CATHETERIZATION WITH CORONARY ANGIOGRAM N/A 10/17/2012   Procedure: LEFT HEART CATHETERIZATION WITH CORONARY ANGIOGRAM;  Surgeon: Tonny Bollman, MD;  Location: [facility] CATH LAB;  Service:  Cardiovascular;  Laterality: N/A;   ROBOTIC ASSISTED TOTAL HYSTERECTOMY WITH BILATERAL SALPINGO OOPHERECTOMY Bilateral 01/02/2021   Procedure: XI ROBOTIC ASSISTED TOTAL HYSTERECTOMY WITH BILATERAL SALPINGO OOPHORECTOMY AND PELVIC WASHINGS;  Surgeon: Adolphus Birchwood, MD;  Location: [facility] ORS;  Service: Gynecology;  Laterality: Bilateral;   Family History  Problem Relation Age of Onset   Hypertension Mother    Aneurysm Father    Hypertension Sister    CAD Brother 76   Diabetes Brother    Heart attack Neg Hx    Stroke Neg Hx    Colon cancer Neg Hx    Breast cancer Neg Hx    Ovarian cancer Neg Hx    Endometrial cancer Neg Hx    Pancreatic cancer Neg Hx    Prostate cancer Neg Hx    Social History    Socioeconomic History   Marital status: Widowed    Spouse name: Not on file   Number of children: 2   Years of education: Bachelors   Highest education level: Associate degree: academic program  Occupational History   Occupation: Chartered certified accountant: [employer]  Tobacco Use   Smoking status: Never    Passive exposure: Never   Smokeless tobacco: Never  Vaping Use   Vaping Use: Never used  Substance and Sexual Activity   Alcohol use: Yes    Alcohol/week: 6.0 standard drinks of alcohol    Types: 6 Standard drinks or equivalent per week    Comment: Occasional glass of wine on the weekends   Drug use: No   Sexual activity: Not Currently  Other Topics Concern   Not on file  Social History Narrative   Patient lives alone. Husband passed away. Son is living with her for the time being.   Patient has 2 sons and grandchildren. Active in their lives.    Patient involved in animal rescue.    Enjoys reading, spending time with her family and cats.       Social Determinants of Health   Financial Resource Strain: Low Risk  (10/30/2022)   Overall Financial Resource Strain (CARDIA)    Difficulty of Paying Living Expenses: Not hard at all  Food Insecurity: No Food Insecurity (10/30/2022)   Hunger Vital Sign    Worried About Running Out of Food in the Last Year: Never true    Ran Out of Food in the Last Year: Never true  Transportation Needs: No Transportation Needs (10/30/2022)   PRAPARE - Administrator, Civil Service (Medical): No    Lack of Transportation (Non-Medical): No  Physical Activity: Inactive (10/30/2022)   Exercise Vital Sign    Days of Exercise per Week: 0 days    Minutes of Exercise per Session: 0 min  Stress: No Stress Concern Present (10/30/2022)   [employer] of Occupational Health - Occupational Stress Questionnaire    Feeling of Stress : Not at all  Social Connections: Socially Isolated (10/30/2022)   Social Connection and Isolation Panel  [NHANES]    Frequency of Communication with Friends and Family: Three times a week    Frequency of Social Gatherings with Friends and Family: More than three times a week    Attends Religious Services: Never    Database administrator or Organizations: No    Attends Engineer, structural: Never    Marital Status: Divorced    Tobacco Counseling Counseling given: No   Clinical Intake:  Pre-visit preparation completed: No  Pain : 0-10 Pain Score: 7  Pain Type: Chronic pain Pain Location: Back Pain Orientation: Lower Pain Descriptors / Indicators: Aching Pain Onset: More than a month ago Pain Frequency: Intermittent     Nutritional Status: BMI of 19-24  Normal Nutritional Risks: None Diabetes: Yes CBG done?: No Did pt. bring in CBG monitor from home?: No  How often do you need to have someone help you when you read instructions, pamphlets, or other written materials from your doctor or pharmacy?: 1 - Never  Diabetic?Nutrition Risk Assessment:  Has the patient had any N/V/D within the last 2 months?  Yes  Does the patient have any non-healing wounds?  No  Has the patient had any unintentional weight loss or weight gain?  No   Diabetes:  Is the patient diabetic?  Yes  If diabetic, was a CBG obtained today?  Yes  Did the patient bring in their glucometer from home?  No  How often do you monitor your CBG's? Occasionally .   Financial Strains and Diabetes Management:  Are you having any financial strains with the device, your supplies or your medication? No .  Does the patient want to be seen by Chronic Care Management for management of their diabetes?  No  Would the patient like to be referred to a Nutritionist or for Diabetic Management?  No   Diabetic Exams:  Diabetic Eye Exam: Completed 05/29/2022 Diabetic Foot Exam: Completed 07/15/2022    Interpreter Needed?: No  Information entered by :: Laurel Dimmer, CMA   Activities of Daily Living     10/30/2022   10:08 AM  In your present state of health, do you have any difficulty performing the following activities:  Hearing? 1  Vision? 0  Comment Dr. Hyacinth Meeker, wear reading glasses  Difficulty concentrating or making decisions? 0  Walking or climbing stairs? 1  Dressing or bathing? 0  Doing errands, shopping? 0    Patient Care Team: Nestor Ramp, MD as PCP - General (Family Medicine) Blima Ledger, OD (Optometry) Comer, Belia Heman, MD as Consulting Physician (Infectious Diseases) McDonald, Rachelle Hora, DPM as Consulting Physician (Podiatry) Adolphus Birchwood, MD (Gynecologic Oncology)  Indicate any recent Medical Services you may have received from other than [facility] providers in the past year (date may be approximate).     Assessment:   This is a routine wellness examination for Becky Fields.   Hearing/Vision screen Denies any hearing issues. Denies any change to her vision. Annual Eye Exam.   Dietary issues and exercise activities discussed: Current Exercise Habits: The patient has a physically strenuous job, but has no regular exercise apart from work., Exercise limited by: None identified   Goals Addressed   None    Depression Screen    10/30/2022   10:07 AM 07/15/2022    8:54 AM 01/08/2022    8:23 AM 10/10/2021   10:52 AM 08/27/2021    3:20 PM 12/18/2020   10:59 AM 12/05/2020    9:15 AM  PHQ 2/9 Scores  PHQ - 2 Score 0 0 0 0 0 0 0  PHQ- 9 Score  1 0 1  0 0    Fall Risk    10/30/2022   10:07 AM 10/10/2021   10:52 AM 08/27/2021    3:26 PM 12/18/2020   10:59 AM 12/05/2020    9:15 AM  Fall Risk   Falls in the past year? 1 1 1 1 1   Number falls in past yr: 1 1 1  0 1  Injury with Fall? 1 0 1 0 0  Risk for fall due to : Other (Comment)  Impaired balance/gait  Impaired balance/gait  Follow up Falls evaluation completed  Falls prevention discussed      FALL RISK PREVENTION PERTAINING TO THE HOME:  Any stairs in or around the home? Yes  If so, are there any without handrails? No  Home  free of loose throw rugs in walkways, pet beds, electrical cords, etc? Yes  Adequate lighting in your home to reduce risk of falls? Yes   ASSISTIVE DEVICES UTILIZED TO PREVENT FALLS:  Life alert? No  Use of a cane, walker or w/c? No  Grab bars in the bathroom? Yes  Shower chair or bench in shower? Yes  Elevated toilet seat or a handicapped toilet? No   TIMED UP AND GO:  Was the test performed?Unable to perform, virtual appointment   Cognitive Function:    11/18/2011   12:00 PM  MMSE - Mini Mental State Exam  Orientation to time 5  Orientation to Place 5  Registration 3  Attention/ Calculation 5  Recall 2  Language- name 2 objects 2  Language- repeat 1  Language- follow 3 step command 3  Language- read & follow direction 1  Write a sentence 1  Copy design 1  Total score 29        10/30/2022   10:15 AM 08/27/2021    3:29 PM  6CIT Screen  What Year? 0 points 0 points  What month? 0 points 0 points  What time? 0 points 0 points  Count back from 20 0 points 0 points  Months in reverse 0 points 0 points  Repeat phrase 0 points 0 points  Total Score 0 points 0 points    Immunizations Immunization History  Administered Date(s) Administered   COVID-19, mRNA, vaccine(Comirnaty)12 years and older 04/10/2022   Fluad Quad(high Dose 65+) 03/20/2020   Influenza Split 06/12/2011, 05/13/2012   Influenza Whole 05/02/2007, 04/13/2008, 07/03/2009   Influenza,inj,Quad PF,6+ Mos 02/24/2013, 02/21/2014, 02/22/2015, 04/02/2016, 03/04/2017, 03/10/2018, 04/19/2019   Influenza-Unspecified 04/10/2022   PFIZER Comirnaty(Gray Top)Covid-19 Tri-Sucrose Vaccine 12/05/2020   PFIZER(Purple Top)SARS-COV-2 Vaccination 08/05/2019, 08/27/2019, 04/08/2020   Pfizer Covid-19 Vaccine Bivalent Booster 79yrs & up 05/27/2021   Pneumococcal Conjugate-13 12/15/2013   Pneumococcal Polysaccharide-23 11/06/2011   Td 10/20/2004   Tdap 10/31/2014   Zoster, Live 04/23/2014   Zoster, Unspecified 04/23/2014     TDAP status: Up to date  Flu Vaccine status: Up to date  Pneumococcal vaccine status: Up to date  Covid-19 vaccine status: Completed vaccines  Qualifies for Shingles Vaccine? Yes   Zostavax completed No   Shingrix Completed?: No.    Education has been provided regarding the importance of this vaccine. Patient has been advised to call insurance company to determine out of pocket expense if they have not yet received this vaccine. Advised may also receive vaccine at local pharmacy or Health Dept. Verbalized acceptance and understanding.  Screening Tests Health Maintenance  Topic Date Due   Zoster Vaccines- Shingrix (1 of 2) 10/09/1996   Diabetic kidney evaluation - eGFR measurement  01/09/2023   HEMOGLOBIN A1C  01/13/2023   INFLUENZA VACCINE  01/21/2023   OPHTHALMOLOGY EXAM  05/30/2023   Diabetic kidney evaluation - Urine ACR  07/16/2023   FOOT EXAM  07/16/2023   Medicare Annual Wellness (AWV)  10/30/2023   DTaP/Tdap/Td (3 - Td or Tdap) 10/30/2024   Pneumonia Vaccine 59+ Years old  Completed   DEXA SCAN  Completed   COVID-19 Vaccine  Completed   Hepatitis C  Screening  Completed   HPV VACCINES  Aged Out   COLONOSCOPY (Pts 45-76yrs Insurance coverage will need to be confirmed)  Discontinued    Health Maintenance  Health Maintenance Due  Topic Date Due   Zoster Vaccines- Shingrix (1 of 2) 10/09/1996    Colorectal cancer screening: No longer required.   Mammogram status: No longer required due to age.  DEXA Scan: 12/24/2021  Lung Cancer Screening: (Low Dose CT Chest recommended if Age 76-80 years, 30 pack-year currently smoking OR have quit w/in 15years.) does not qualify.   Lung Cancer Screening Referral: not appicable   Additional Screening:  Hepatitis C Screening: does qualify; Completed 04/20/2022  Vision Screening: Recommended annual ophthalmology exams for early detection of glaucoma and other disorders of the eye. Is the patient up to date with their  annual eye exam?  Yes  Who is the provider or what is the name of the office in which the patient attends annual eye exams? Dr. Hyacinth Meeker If pt is not established with a provider, would they like to be referred to a provider to establish care? No .   Dental Screening: Recommended annual dental exams for proper oral hygiene  Community Resource Referral / Chronic Care Management: CRR required this visit?  No   CCM required this visit?  No      Plan:     I have personally reviewed and noted the following in the patient's chart:   Medical and social history Use of alcohol, tobacco or illicit drugs  Current medications and supplements including opioid prescriptions. Patient is not currently taking opioid prescriptions. Functional ability and status Nutritional status Physical activity Advanced directives List of other physicians Hospitalizations, surgeries, and ER visits in previous 12 months Vitals Screenings to include cognitive, depression, and falls Referrals and appointments  In addition, I have reviewed and discussed with patient certain preventive protocols, quality metrics, and best practice recommendations. A written personalized care plan for preventive services as well as general preventive health recommendations were provided to patient.    Becky Fields , Thank you for taking time to come for your Medicare Wellness Visit. I appreciate your ongoing commitment to your health goals. Please review the following plan we discussed and let me know if I can assist you in the future.   These are the goals we discussed:  Goals       Weight (lb) < 140 lb (63.5 kg) (pt-stated)        This is a list of the screening recommended for you and due dates:  Health Maintenance  Topic Date Due   Zoster (Shingles) Vaccine (1 of 2) 10/09/1996   Yearly kidney function blood test for diabetes  01/09/2023   Hemoglobin A1C  01/13/2023   Flu Shot  01/21/2023   Eye exam for diabetics   05/30/2023   Yearly kidney health urinalysis for diabetes  07/16/2023   Complete foot exam   07/16/2023   Medicare Annual Wellness Visit  10/30/2023   DTaP/Tdap/Td vaccine (3 - Td or Tdap) 10/30/2024   Pneumonia Vaccine  Completed   DEXA scan (bone density measurement)  Completed   COVID-19 Vaccine  Completed   Hepatitis C Screening: USPSTF Recommendation to screen - Ages 50-79 yo.  Completed   HPV Vaccine  Aged Out   Colon Cancer Screening  Discontinued     Lonna Cobb, [facility]   10/30/2022   Nurse Notes: Approximately 30 minute Non-Face -To-Face Medicare Wellness Visit

## 2022-10-30 NOTE — Patient Instructions (Signed)

## 2022-11-05 ENCOUNTER — Telehealth: Payer: Self-pay | Admitting: Podiatry

## 2022-11-05 NOTE — Telephone Encounter (Signed)
Diabetic having left leg cramps from ankle to knee at night for a few months but getting worse. She is asking if you could recommend something for her. Its not nightly but often and causing pain and interrupts her sleep. She has to get up and massage it to make it stop hurting.She has an appt with you next week as well for her dfc. She has an appt with her new pcp 6.21 as her pcp has retired

## 2022-11-05 NOTE — Telephone Encounter (Signed)
Notified pt and she said she currently does not have a pcp and has her regular follow up appt with you on 5.21.

## 2022-11-10 ENCOUNTER — Ambulatory Visit: Payer: Medicare HMO | Admitting: Podiatry

## 2022-11-10 DIAGNOSIS — B351 Tinea unguium: Secondary | ICD-10-CM

## 2022-11-10 DIAGNOSIS — M79675 Pain in left toe(s): Secondary | ICD-10-CM

## 2022-11-10 DIAGNOSIS — L84 Corns and callosities: Secondary | ICD-10-CM

## 2022-11-10 DIAGNOSIS — G4762 Sleep related leg cramps: Secondary | ICD-10-CM

## 2022-11-10 DIAGNOSIS — M79674 Pain in right toe(s): Secondary | ICD-10-CM

## 2022-11-10 DIAGNOSIS — E1142 Type 2 diabetes mellitus with diabetic polyneuropathy: Secondary | ICD-10-CM

## 2022-11-10 NOTE — Progress Notes (Signed)
  Subjective:  Patient ID: Becky Fields, female    DOB: [birth year],  MRN: [id number]  Chief Complaint  Patient presents with   Nail Problem    Thick painful toenails, 3 month follow up    77 y.o. female presents with the above complaint. History confirmed with patient.  Callus on toe on right foot has built up again, causing some tenderness.  The nails are thickened elongated causing pain and discomfort.  She has been having some issues with cramping in her legs at night using tonic water  Objective:  Physical Exam: warm, good capillary refill, no trophic changes or ulcerative lesions, normal DP and PT pulses and normal sensory exam.    Bilateral submetatarsal 1 callus Left foot: Medial pinch callus, thickened elongated discolored dystrophic nails with subungual debris Right Foot: Plantar IPJ callus hallux, contracted hammertoe digit of third toe with preulcerative callus distal tip that is painful.  Toe is semireducible, thickened elongated discolored dystrophic nails with subungual debris  Assessment:   1. Type 2 diabetes mellitus with peripheral neuropathy (HCC)   2. Callus of foot   3. Pain due to onychomycosis of toenails of both feet          Plan:  Patient was evaluated and treated and all questions answered.  She has a new PCP with her first visit next month, she will discuss the cramping issues with her.  Pulses are palpable do not think it is a circulatory issue causing this, not consistent with claudication symptoms as it typically does not bother her during ambulation   All symptomatic hyperkeratoses were safely debrided with a sterile #15 blade to patient's level of comfort without incident. We discussed preventative and palliative care of these lesions including supportive and accommodative shoegear, padding, prefabricated and custom molded orthoses and offloading silicone pads.  She is also using urea cream on this that has helped  Discussed the etiology and  treatment options for the condition in detail with the patient. Recommended debridement of the nails today. Sharp and mechanical debridement performed of all painful and mycotic nails today. Nails debrided in length and thickness using a nail nipper to level of comfort. Discussed treatment options including appropriate shoe gear. Follow up as needed for painful nails.    Return in about 3 months (around 02/10/2023) for at risk diabetic foot care.

## 2022-12-01 ENCOUNTER — Other Ambulatory Visit: Payer: Self-pay | Admitting: Family Medicine

## 2022-12-01 DIAGNOSIS — E1142 Type 2 diabetes mellitus with diabetic polyneuropathy: Secondary | ICD-10-CM

## 2022-12-17 NOTE — Progress Notes (Signed)
    SUBJECTIVE:   CHIEF COMPLAINT / HPI:   Off balance, leg cramps - feeling off balance when turning while moving. Has occasionally fallen, usually about twice per year. 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 Denies dizziness, lightheadedness, vision changes, headaches. Denies leg pain or giving way. Inquiring about physical therapy for strength building. Occasionally with some low back pain.  - also with some leg cramps. Taking herbal supplement with magnesium which helps. Trying to stay hydrated.    OBJECTIVE:   BP 127/63   Pulse 63   Ht 5\' 2"  (1.575 m)   Wt 144 lb 12.8 oz (65.7 kg)   SpO2 100%   BMI 26.48 kg/m   Gen: well appearing, in NAD Card: RRR Lungs: CTAB MSK: 5/5 LE Strength including quad, plantar and dorsiflexion. Able to stand and sit on exam table without difficulty or assistance. Ext: WWP, no edema.    ASSESSMENT/PLAN:   Balance issues Referred for PT per patient request. Discussed quad strengthening exercises given positive preventive data in older adults.  Leg cramps Recommend hydration. Check labs to assess for contributing factors.   Type 2 diabetes mellitus with peripheral neuropathy (HCC) Recheck a1c     Caro Laroche, DO

## 2022-12-18 ENCOUNTER — Encounter: Payer: Self-pay | Admitting: Family Medicine

## 2022-12-18 ENCOUNTER — Ambulatory Visit (INDEPENDENT_AMBULATORY_CARE_PROVIDER_SITE_OTHER): Payer: Medicare HMO | Admitting: Family Medicine

## 2022-12-18 VITALS — BP 127/63 | HR 63 | Ht 62.0 in | Wt 144.8 lb

## 2022-12-18 DIAGNOSIS — R252 Cramp and spasm: Secondary | ICD-10-CM

## 2022-12-18 DIAGNOSIS — E1142 Type 2 diabetes mellitus with diabetic polyneuropathy: Secondary | ICD-10-CM

## 2022-12-18 DIAGNOSIS — G8929 Other chronic pain: Secondary | ICD-10-CM

## 2022-12-18 DIAGNOSIS — M545 Low back pain, unspecified: Secondary | ICD-10-CM

## 2022-12-18 DIAGNOSIS — R531 Weakness: Secondary | ICD-10-CM

## 2022-12-18 LAB — POCT GLYCOSYLATED HEMOGLOBIN (HGB A1C): HbA1c, POC (controlled diabetic range): 6.2 % (ref 0.0–7.0)

## 2022-12-18 NOTE — Assessment & Plan Note (Signed)
Recheck a1c. °

## 2022-12-18 NOTE — Patient Instructions (Addendum)
It was great to see you!  Our plans for today:  - We are referring you to physical therapy, call us if you don't get a call in a few weeks to schedule.  - We are checking some labs today, we will release these results to your MyChart.  Take care and seek immediate care sooner if you develop any concerns.   Dr. Linwood Dibbles

## 2022-12-19 LAB — CBC
Hematocrit: 39.4 % (ref 34.0–46.6)
Hemoglobin: 13.3 g/dL (ref 11.1–15.9)
MCH: 31 pg (ref 26.6–33.0)
MCHC: 33.8 g/dL (ref 31.5–35.7)
MCV: 92 fL (ref 79–97)
Platelets: 158 10*3/uL (ref 150–450)
RBC: 4.29 x10E6/uL (ref 3.77–5.28)
RDW: 12.1 % (ref 11.7–15.4)
WBC: 5.1 10*3/uL (ref 3.4–10.8)

## 2022-12-19 LAB — BASIC METABOLIC PANEL
BUN/Creatinine Ratio: 29 — ABNORMAL HIGH (ref 12–28)
BUN: 23 mg/dL (ref 8–27)
CO2: 23 mmol/L (ref 20–29)
Calcium: 8.9 mg/dL (ref 8.7–10.3)
Chloride: 105 mmol/L (ref 96–106)
Creatinine, Ser: 0.8 mg/dL (ref 0.57–1.00)
Glucose: 134 mg/dL — ABNORMAL HIGH (ref 70–99)
Potassium: 4.3 mmol/L (ref 3.5–5.2)
Sodium: 140 mmol/L (ref 134–144)
eGFR: 76 mL/min/{1.73_m2} (ref >59)

## 2022-12-19 LAB — MAGNESIUM: Magnesium: 1.9 mg/dL (ref 1.6–2.3)

## 2023-01-04 ENCOUNTER — Ambulatory Visit: Payer: Medicare HMO | Attending: Family Medicine

## 2023-01-04 ENCOUNTER — Other Ambulatory Visit: Payer: Self-pay

## 2023-01-04 DIAGNOSIS — R252 Cramp and spasm: Secondary | ICD-10-CM | POA: Insufficient documentation

## 2023-01-04 DIAGNOSIS — R531 Weakness: Secondary | ICD-10-CM | POA: Insufficient documentation

## 2023-01-04 DIAGNOSIS — R2681 Unsteadiness on feet: Secondary | ICD-10-CM | POA: Insufficient documentation

## 2023-01-04 DIAGNOSIS — M6281 Muscle weakness (generalized): Secondary | ICD-10-CM | POA: Diagnosis not present

## 2023-01-04 NOTE — Therapy (Signed)
OUTPATIENT PHYSICAL THERAPY LOWER EXTREMITY EVALUATION   Patient Name: Becky Fields MRN: [id number] DOB:Jul 20, 1945, 77 y.o., female Today's Date: 01/04/2023  END OF SESSION:  PT End of Session - 01/04/23 1626     Visit Number 1    Number of Visits 7    Date for PT Re-Evaluation 02/15/23    Authorization Type [employer] MCR    PT Start Time 1530    PT Stop Time 1605    PT Time Calculation (min) 35 min    Activity Tolerance Patient tolerated treatment well    Behavior During Therapy WFL for tasks assessed/performed             Past Medical History:  Diagnosis Date   Cataract    Diabetes mellitus    Ejection fraction    .   Hepatitis C    Hypertension    Hypertriglyceridemia    Pneumonia    Takotsubo syndrome    October 16, 2012   Past Surgical History:  Procedure Laterality Date   CHOLECYSTECTOMY     COLONOSCOPY     EYE SURGERY  02/2012   bilateral cataract surg   LEFT HEART CATHETERIZATION WITH CORONARY ANGIOGRAM N/A 10/17/2012   Procedure: LEFT HEART CATHETERIZATION WITH CORONARY ANGIOGRAM;  Surgeon: Tonny Bollman, MD;  Location: [facility] CATH LAB;  Service: Cardiovascular;  Laterality: N/A;   ROBOTIC ASSISTED TOTAL HYSTERECTOMY WITH BILATERAL SALPINGO OOPHERECTOMY Bilateral 01/02/2021   Procedure: XI ROBOTIC ASSISTED TOTAL HYSTERECTOMY WITH BILATERAL SALPINGO OOPHORECTOMY AND PELVIC WASHINGS;  Surgeon: Adolphus Birchwood, MD;  Location: [facility];  Service: Gynecology;  Laterality: Bilateral;   Patient Active Problem List   Diagnosis Date Noted   Pelvic mass 12/24/2020   Urinary retention 12/18/2020   Low back pain 12/05/2020   Estrogen deficiency 12/05/2020   Plantar callus 06/27/2020   Trigger finger, acquired 11/18/2016   Hearing loss secondary to cerumen impaction, left 11/18/2016   Hepatic cirrhosis (HCC) 05/23/2014   Mitral regurgitation 10/18/2012   Takotsubo syndrome 10/18/2012   HYPERTRIGLYCERIDEMIA 01/25/2009   INSOMNIA, PERSISTENT 01/25/2009   Type 2 diabetes  mellitus with peripheral neuropathy (HCC) 04/13/2008   History of colonic polyps 05/02/2007   Chronic hepatitis C without hepatic coma (HCC) 03/07/2007   HYPERTENSION, BENIGN ESSENTIAL 01/28/2007    PCP: Caro Laroche, DO   REFERRING PROVIDER: Caro Laroche, DO   REFERRING DIAG:  R25.2 (ICD-10-CM) - Leg cramps R53.1 (ICD-10-CM) - Weakness  THERAPY DIAG:  Muscle weakness (generalized)  Unsteadiness on feet  Rationale for Evaluation and Treatment: Rehabilitation  ONSET DATE: Chronic  SUBJECTIVE:   SUBJECTIVE STATEMENT: Pt presents to PT with reports of one year hx of increasing unsteadiness and LE weakness. Describes mostly mechanical falls, does have some noted neuropathy in bilateral feet. Has some lower back pain last year when she had a respiratory illness, but these mainly has subsided with residual lower back morning stiffness noted.   PERTINENT HISTORY: DMII, HTN  PAIN:  Are you having pain?  No: NPRS scale: 0/10  PRECAUTIONS: Fall  WEIGHT BEARING RESTRICTIONS: No  FALLS:  Has patient fallen in last 6 months? Yes. 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  PLOF: Independent  PATIENT GOALS: improve strength and balance, decrease number of falls  OBJECTIVE:   DIAGNOSTIC FINDINGS:  N/A  PATIENT SURVEYS:  FOTO: 59% function; 63% predcited  COGNITION: Overall cognitive status: Within functional limits for tasks assessed     SENSATION: Light touch: Impaired  - bilateral feet (peripheral neruopathy)    POSTURE: rounded shoulders and forward head  PALPATION: N/A  LOWER EXTREMITY MMT:  MMT Right eval Left eval  Hip flexion 3+/5 3/5  Hip extension    Hip abduction 3+/5 3/5  Hip adduction    Hip  internal rotation    Hip external rotation    Knee flexion 5/5 5/5  Knee extension 5/5 5/5  Ankle dorsiflexion    Ankle plantarflexion    Ankle inversion    Ankle eversion     (Blank rows = not tested)  LOWER EXTREMITY SPECIAL TESTS:  Hip special tests: Hip scouring test: positive - left  FUNCTIONAL TESTS:  30 Second Sit to Stand: 13 reps Tandem stance: 3 seconds each  GAIT: Distance walked: 50ft Assistive device utilized: None Level of assistance: Complete Independence Comments: able to change spee and turn quickly with only slight LoB  TREATMENT: [facility] Adult PT Treatment:                                                DATE: 01/04/2023 Therapeutic Exercise: Supine SLR x 5 each S/L hip ab dx 5 each Tandem stance x 30" each  PATIENT EDUCATION:  Education details: eval findings, FOTO, HEP, POC Person educated: Patient Education method: Explanation, Demonstration, and Handouts Education comprehension: verbalized understanding and returned demonstration  HOME EXERCISE PROGRAM: Access Code: [email or web address] URL: [email or web address] Date: 01/04/2023 Prepared by: Edwinna Areola  Exercises - Sidelying Hip Abduction  - 1 x daily - 7 x weekly - 2-3 sets - 10 reps - Active Straight Leg Raise with Quad Set  - 1 x daily - 7 x weekly - 2-3 sets - 10 reps - Standing Tandem Balance with Counter Support  - 1 x daily - 7 x weekly - 2-3 reps - 30 sec hold  ASSESSMENT:  CLINICAL IMPRESSION: Patient is a 77 y.o. F who was seen today for physical therapy evaluation and treatment for LE weakness and imbalance. Physical findings are consistent with MD impression as pt demonstrates proximal hip weakness and decreased balance in narrow BoS and with change in speed/direction. Her FOTO shows decrease in subjective functional ability below PLOF. Pt would benefit from skilled PT services working on improving LE strength and balance.   OBJECTIVE IMPAIRMENTS: Abnormal gait, decreased activity  tolerance, decreased mobility, difficulty walking, and decreased strength.   ACTIVITY LIMITATIONS: carrying, lifting, squatting, stairs, and transfers  PARTICIPATION LIMITATIONS: shopping, community activity, occupation, and yard work  PERSONAL FACTORS: Time since onset of injury/illness/exacerbation and 1-2 comorbidities: DMII, HTN  are also affecting patient's functional outcome.   REHAB POTENTIAL: Excellent  CLINICAL DECISION MAKING: Stable/uncomplicated  EVALUATION COMPLEXITY: Low   GOALS: Goals reviewed with patient? No  SHORT TERM GOALS: Target date: 01/25/2023   Pt will be compliant and knowledgeable with initial HEP for improved comfort and carryover Baseline: initial HEP given  Goal status: INITIAL  LONG TERM GOALS: Target date: 03/01/2023   Pt will improve FOTO function score to no less than 63% as proxy for functional improvement Baseline: 59% function Goal status: INITIAL   2.  Pt will improve LE MMT to no less than 4/5 for all tested motions for  improved transfer ability and balance Baseline: see MMT chart Goal status: INITIAL  3.  Pt will increase 30 Second Sit to Stand rep count to no less than 15 reps for improved balance, strength, and functional mobility Baseline: 13 reps  Goal status: INITIAL   4.  Pt will be able to hold tandem stance for at least 30 seconds for improved narrow BoS balance and function Baseline: 3 seconds Goal status: INITIAL  PLAN:  PT FREQUENCY: 1x/week  PT DURATION: 6 weeks  PLANNED INTERVENTIONS: Therapeutic exercises, Therapeutic activity, Neuromuscular re-education, Balance training, Gait training, Patient/Family education, Self Care, Joint mobilization, Dry Needling, Electrical stimulation, Cryotherapy, Moist heat, Vasopneumatic device, Manual therapy, and Re-evaluation  PLAN FOR NEXT SESSION: assess HEP response, hip strengthening, static balance, obstacle navigation  Referring diagnosis?  R25.2 (ICD-10-CM) - Leg  cramps R53.1 (ICD-10-CM) - Weakness  Treatment diagnosis? (if different than referring diagnosis)  Muscle weakness (generalized) Unsteadiness on feet  What was this (referring dx) caused by? []  Surgery [x]  Fall []  Ongoing issue []  Arthritis []  Other: ____________  Laterality: []  Rt []  Lt []  Both  Check all possible CPT codes:  *CHOOSE 10 OR LESS*    [x]  97110 (Therapeutic Exercise)  []  92507 (SLP Treatment)  [x]  97112 (Neuro Re-ed)   []  92526 (Swallowing Treatment)   [x]  97116 (Gait Training)   []  [id number] (Cognitive Training, 1st 15 minutes) [x]  16109 (Manual Therapy)   []  97130 (Cognitive Training, each add'l 15 minutes)  [x]  97164 (Re-evaluation)                              []  Other, List CPT Code ____________  [x]  97530 (Therapeutic Activities)     [x]  97535 (Self Care)   []  All codes above (97110 - 97535)  []  97012 (Mechanical Traction)  [x]  97014 (E-stim Unattended)  [x]  97032 (E-stim manual)  []  97033 (Ionto)  []  97035 (Ultrasound) []  97750 (Physical Performance Training) []  [id number] (Aquatic Therapy) []  97016 (Vasopneumatic Device) []  [id number] (Paraffin) []  97034 (Contrast Bath) []  97597 (Wound Care 1st 20 sq cm) []  97598 (Wound Care each add'l 20 sq cm) []  97760 (Orthotic Fabrication, Fitting, Training Initial) []  [id number] (Prosthetic Management and Training Initial) []  [id number] (Orthotic or Prosthetic Training/ Modification Subsequent)   Eloy End, PT 01/04/2023, 4:49 PM

## 2023-01-13 ENCOUNTER — Ambulatory Visit: Payer: Medicare HMO

## 2023-01-13 DIAGNOSIS — R252 Cramp and spasm: Secondary | ICD-10-CM | POA: Diagnosis not present

## 2023-01-13 DIAGNOSIS — R2681 Unsteadiness on feet: Secondary | ICD-10-CM | POA: Diagnosis not present

## 2023-01-13 DIAGNOSIS — M6281 Muscle weakness (generalized): Secondary | ICD-10-CM

## 2023-01-13 DIAGNOSIS — R531 Weakness: Secondary | ICD-10-CM | POA: Diagnosis not present

## 2023-01-13 NOTE — Therapy (Signed)
OUTPATIENT PHYSICAL THERAPY TREATMENT NOTE   Patient Name: Becky Fields MRN: [id number] DOB:[birth year], 77 y.o., female Today's Date: 01/13/2023  END OF SESSION:  PT End of Session - 01/13/23 1616     Visit Number 2    Number of Visits 7    Date for PT Re-Evaluation 02/15/23    Authorization Type [employer] MCR    PT Start Time 1615    PT Stop Time 1655    PT Time Calculation (min) 40 min    Activity Tolerance Patient tolerated treatment well    Behavior During Therapy WFL for tasks assessed/performed              Past Medical History:  Diagnosis Date   Cataract    Diabetes mellitus    Ejection fraction    .   Hepatitis C    Hypertension    Hypertriglyceridemia    Pneumonia    Takotsubo syndrome    October 16, 2012   Past Surgical History:  Procedure Laterality Date   CHOLECYSTECTOMY     COLONOSCOPY     EYE SURGERY  02/2012   bilateral cataract surg   LEFT HEART CATHETERIZATION WITH CORONARY ANGIOGRAM N/A 10/17/2012   Procedure: LEFT HEART CATHETERIZATION WITH CORONARY ANGIOGRAM;  Surgeon: Tonny Bollman, MD;  Location: [facility] CATH LAB;  Service: Cardiovascular;  Laterality: N/A;   ROBOTIC ASSISTED TOTAL HYSTERECTOMY WITH BILATERAL SALPINGO OOPHERECTOMY Bilateral 01/02/2021   Procedure: XI ROBOTIC ASSISTED TOTAL HYSTERECTOMY WITH BILATERAL SALPINGO OOPHORECTOMY AND PELVIC WASHINGS;  Surgeon: Adolphus Birchwood, MD;  Location: [facility];  Service: Gynecology;  Laterality: Bilateral;   Patient Active Problem List   Diagnosis Date Noted   Pelvic mass 12/24/2020   Urinary retention 12/18/2020   Low back pain 12/05/2020   Estrogen deficiency 12/05/2020   Plantar callus 06/27/2020   Trigger finger, acquired 11/18/2016   Hearing loss secondary to cerumen impaction, left 11/18/2016   Hepatic cirrhosis (HCC) 05/23/2014   Mitral regurgitation 10/18/2012   Takotsubo syndrome 10/18/2012   HYPERTRIGLYCERIDEMIA 01/25/2009   INSOMNIA, PERSISTENT 01/25/2009   Type 2 diabetes mellitus with  peripheral neuropathy (HCC) 04/13/2008   History of colonic polyps 05/02/2007   Chronic hepatitis C without hepatic coma (HCC) 03/07/2007   HYPERTENSION, BENIGN ESSENTIAL 01/28/2007    PCP: Caro Laroche, DO   REFERRING PROVIDER: Caro Laroche, DO   REFERRING DIAG:  R25.2 (ICD-10-CM) - Leg cramps R53.1 (ICD-10-CM) - Weakness  THERAPY DIAG:  Muscle weakness (generalized)  Unsteadiness on feet  Rationale for Evaluation and Treatment: Rehabilitation  ONSET DATE: Chronic  SUBJECTIVE:   SUBJECTIVE STATEMENT: Patient reports that she fell last week, it was at work at the [employer]. She did not report the fall, and also did not have any injuries from this.   PERTINENT HISTORY: DMII, HTN  PAIN:  Are you having pain?  No: NPRS scale: 0/10  PRECAUTIONS: Fall  WEIGHT BEARING RESTRICTIONS: No  FALLS:  Has patient fallen in last 6 months? Yes. 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  PLOF: Independent  PATIENT GOALS: improve strength and balance, decrease number of falls  OBJECTIVE:   DIAGNOSTIC FINDINGS:   N/A  PATIENT SURVEYS:  FOTO: 59% function; 63% predcited  COGNITION: Overall cognitive status: Within functional limits for tasks assessed  SENSATION: Light touch: Impaired  - bilateral feet (peripheral neruopathy)    POSTURE: rounded shoulders and forward head  PALPATION: N/A  LOWER EXTREMITY MMT:  MMT Right eval Left eval  Hip flexion 3+/5 3/5  Hip extension    Hip abduction 3+/5 3/5  Hip adduction    Hip internal rotation    Hip external rotation    Knee flexion 5/5 5/5  Knee extension 5/5 5/5  Ankle dorsiflexion    Ankle plantarflexion    Ankle inversion    Ankle eversion     (Blank  rows = not tested)  LOWER EXTREMITY SPECIAL TESTS:  Hip special tests: Hip scouring test: positive - left  FUNCTIONAL TESTS:  30 Second Sit to Stand: 13 reps Tandem stance: 3 seconds each  GAIT: Distance walked: 32ft Assistive device utilized: None Level of assistance: Complete Independence Comments: able to change spee and turn quickly with only slight LoB  TREATMENT: [facility] Adult PT Treatment:                                                DATE: 01/13/23 Therapeutic Exercise: Nustep level 5 x 5 mins Standing hip abduction/extension 2x10 BIL Standing marching UE support 2x30" Heel raises 2x10 Toe raises 2x10 STS - RTB around knees and mirror for feedback to prevent valgus collapse Neuromuscular re-ed: Step ups onto Airex fwd/lat x10 BIL Step up from Airex to 4" step fwd Tandem walking fwd 1 UE support  Hurdle step overs step to pattern fwd/lat - cues for hip/knee flexion as she tends to circumduct to clear hurdle   [facility] Adult PT Treatment:                                                DATE: 01/04/2023 Therapeutic Exercise: Supine SLR x 5 each S/L hip ab dx 5 each Tandem stance x 30" each  PATIENT EDUCATION:  Education details: eval findings, FOTO, HEP, POC Person educated: Patient Education method: Explanation, Demonstration, and Handouts Education comprehension: verbalized understanding and returned demonstration  HOME EXERCISE PROGRAM: Access Code: [email or web address] URL: [email or web address] Date: 01/04/2023 Prepared by: Edwinna Areola  Exercises - Sidelying Hip Abduction  - 1 x daily - 7 x weekly - 2-3 sets - 10 reps - Active Straight Leg Raise with Quad Set  - 1 x daily - 7 x weekly - 2-3 sets - 10 reps - Standing Tandem Balance with Counter Support  - 1 x daily - 7 x weekly - 2-3 reps - 30 sec hold  ASSESSMENT:  CLINICAL IMPRESSION: Patient presents to first follow up PT session reporting one fall since eval, no injuries from this. She states she has  been occasional compliant with her HEP, has difficulty getting up off of the floor. Session today focused on LE strengthening and balance tasks to decrease fall risk. She tends to valgus collapse with STS, needs visual and tactile cues to correct. Plan to also work on floor to chair transfers in future sessions. Patient continues to benefit from skilled PT services and should be progressed as able to improve functional independence.    OBJECTIVE IMPAIRMENTS: Abnormal gait, decreased activity tolerance, decreased mobility, difficulty walking, and decreased strength.   ACTIVITY LIMITATIONS: carrying, lifting, squatting, stairs,  and transfers  PARTICIPATION LIMITATIONS: shopping, community activity, occupation, and yard work  PERSONAL FACTORS: Time since onset of injury/illness/exacerbation and 1-2 comorbidities: DMII, HTN  are also affecting patient's functional outcome.   REHAB POTENTIAL: Excellent  CLINICAL DECISION MAKING: Stable/uncomplicated  EVALUATION COMPLEXITY: Low   GOALS: Goals reviewed with patient? No  SHORT TERM GOALS: Target date: 01/25/2023   Pt will be compliant and knowledgeable with initial HEP for improved comfort and carryover Baseline: initial HEP given  Goal status: INITIAL  LONG TERM GOALS: Target date: 03/01/2023   Pt will improve FOTO function score to no less than 63% as proxy for functional improvement Baseline: 59% function Goal status: INITIAL   2.  Pt will improve LE MMT to no less than 4/5 for all tested motions for improved transfer ability and balance Baseline: see MMT chart Goal status: INITIAL  3.  Pt will increase 30 Second Sit to Stand rep count to no less than 15 reps for improved balance, strength, and functional mobility Baseline: 13 reps  Goal status: INITIAL   4.  Pt will be able to hold tandem stance for at least 30 seconds for improved narrow BoS balance and function Baseline: 3 seconds Goal status: INITIAL  PLAN:  PT FREQUENCY:  1x/week  PT DURATION: 6 weeks  PLANNED INTERVENTIONS: Therapeutic exercises, Therapeutic activity, Neuromuscular re-education, Balance training, Gait training, Patient/Family education, Self Care, Joint mobilization, Dry Needling, Electrical stimulation, Cryotherapy, Moist heat, Vasopneumatic device, Manual therapy, and Re-evaluation  PLAN FOR NEXT SESSION: assess HEP response, hip strengthening, static balance, obstacle navigation  Referring diagnosis?  R25.2 (ICD-10-CM) - Leg cramps R53.1 (ICD-10-CM) - Weakness  Treatment diagnosis? (if different than referring diagnosis)  Muscle weakness (generalized) Unsteadiness on feet  What was this (referring dx) caused by? []  Surgery [x]  Fall []  Ongoing issue []  Arthritis []  Other: ____________  Laterality: []  Rt []  Lt []  Both  Check all possible CPT codes:  *CHOOSE 10 OR LESS*    [x]  97110 (Therapeutic Exercise)  []  92507 (SLP Treatment)  [x]  97112 (Neuro Re-ed)   []  92526 (Swallowing Treatment)   [x]  97116 (Gait Training)   []  [id number] (Cognitive Training, 1st 15 minutes) [x]  97140 (Manual Therapy)   []  97130 (Cognitive Training, each add'l 15 minutes)  [x]  97164 (Re-evaluation)                              []  Other, List CPT Code ____________  [x]  97530 (Therapeutic Activities)     [x]  97535 (Self Care)   []  All codes above (97110 - 97535)  []  97012 (Mechanical Traction)  [x]  97014 (E-stim Unattended)  [x]  97032 (E-stim manual)  []  97033 (Ionto)  []  97035 (Ultrasound) []  97750 (Physical Performance Training) []  [id number] (Aquatic Therapy) []  97016 (Vasopneumatic Device) []  [id number] (Paraffin) []  97034 (Contrast Bath) []  97597 (Wound Care 1st 20 sq cm) []  97598 (Wound Care each add'l 20 sq cm) []  97760 (Orthotic Fabrication, Fitting, Training Initial) []  [id number] (Prosthetic Management and Training Initial) []  [id number] (Orthotic or Prosthetic Training/ Modification Subsequent)   Berta Minor, PTA 01/13/2023, 4:56 PM

## 2023-01-20 ENCOUNTER — Ambulatory Visit: Payer: Medicare HMO

## 2023-01-20 DIAGNOSIS — R531 Weakness: Secondary | ICD-10-CM | POA: Diagnosis not present

## 2023-01-20 DIAGNOSIS — M6281 Muscle weakness (generalized): Secondary | ICD-10-CM | POA: Diagnosis not present

## 2023-01-20 DIAGNOSIS — R2681 Unsteadiness on feet: Secondary | ICD-10-CM

## 2023-01-20 DIAGNOSIS — R252 Cramp and spasm: Secondary | ICD-10-CM | POA: Diagnosis not present
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
## 2023-01-27 ENCOUNTER — Ambulatory Visit: Payer: Medicare HMO | Attending: Family Medicine

## 2023-01-27 DIAGNOSIS — M6281 Muscle weakness (generalized): Secondary | ICD-10-CM | POA: Diagnosis not present

## 2023-01-27 DIAGNOSIS — R2681 Unsteadiness on feet: Secondary | ICD-10-CM | POA: Insufficient documentation

## 2023-01-27 NOTE — Therapy (Signed)
OUTPATIENT PHYSICAL THERAPY TREATMENT NOTE   Patient Name: Becky Fields MRN: [id number] DOB:[birth year], 77 y.o., female Today's Date: 01/27/2023  END OF SESSION:  PT End of Session - 01/27/23 1618     Visit Number 4    Number of Visits 7    Date for PT Re-Evaluation 02/15/23    Authorization Type [employer] M[employer]    PT Start Time 1617    PT Stop Time 1655    PT Time Calculation (min) 38 min    Activity Tolerance Patient tolerated treatment well    Behavior During Therapy WFL for tasks assessed/performed                Past Medical History:  Diagnosis Date   Cataract    Diabetes mellitus    Ejection fraction    .   Hepatitis C    Hypertension    Hypertriglyceridemia    Pneumonia    Takotsubo syndrome    October 16, 2012   Past Surgical History:  Procedure Laterality Date   CHOLECYSTECTOMY     COLONOSCOPY     EYE SURGERY  02/2012   bilateral cataract surg   LEFT HEART CATHETERIZATION WITH CORONARY ANGIOGRAM N/A 10/17/2012   Procedure: LEFT HEART CATHETERIZATION WITH CORONARY ANGIOGRAM;  Surgeon: Tonny Bollman, MD;  Location: [facility] CATH LAB;  Service: Cardiovascular;  Laterality: N/A;   ROBOTIC ASSISTED TOTAL HYSTERECTOMY WITH BILATERAL SALPINGO OOPHERECTOMY Bilateral 01/02/2021   Procedure: XI ROBOTIC ASSISTED TOTAL HYSTERECTOMY WITH BILATERAL SALPINGO OOPHORECTOMY AND PELVIC WASHINGS;  Surgeon: Adolphus Birchwood, MD;  Location: [facility];  Service: Gynecology;  Laterality: Bilateral;   Patient Active Problem List   Diagnosis Date Noted   Pelvic mass 12/24/2020   Urinary retention 12/18/2020   Low back pain 12/05/2020   Estrogen deficiency 12/05/2020   Plantar callus 06/27/2020   Trigger finger, acquired 11/18/2016   Hearing loss secondary to cerumen impaction, left 11/18/2016   Hepatic cirrhosis (HCC) 05/23/2014   Mitral regurgitation 10/18/2012   Takotsubo syndrome 10/18/2012   HYPERTRIGLYCERIDEMIA 01/25/2009   INSOMNIA, PERSISTENT 01/25/2009   Type 2 diabetes mellitus with  peripheral neuropathy (HCC) 04/13/2008   History of colonic polyps 05/02/2007   Chronic hepatitis C without hepatic coma (HCC) 03/07/2007   HYPERTENSION, BENIGN ESSENTIAL 01/28/2007    PCP: Caro Laroche, DO   REFERRING PROVIDER: Caro Laroche, DO   REFERRING DIAG:  R25.2 (ICD-10-CM) - Leg cramps R53.1 (ICD-10-CM) - Weakness  THERAPY DIAG:  Muscle weakness (generalized)  Unsteadiness on feet  Rationale for Evaluation and Treatment: Rehabilitation  ONSET DATE: Chronic  SUBJECTIVE:   SUBJECTIVE STATEMENT: Patient reports that she feels tired today.   PERTINENT HISTORY: DMII, HTN  PAIN:  Are you having pain?  No: NPRS scale: 0/10  PRECAUTIONS: Fall  WEIGHT BEARING RESTRICTIONS: No  FALLS:  Has patient fallen in last 6 months? Yes. 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  PLOF: Independent  PATIENT GOALS: improve strength and balance, decrease number of falls  OBJECTIVE:   DIAGNOSTIC FINDINGS:   N/A  PATIENT SURVEYS:  FOTO: 59% function; 63% predcited  COGNITION: Overall cognitive status: Within functional limits for tasks assessed     SENSATION: Light touch: Impaired  - bilateral feet (peripheral neruopathy)    POSTURE: rounded shoulders and  forward head  PALPATION: N/A  LOWER EXTREMITY MMT:  MMT Right eval Left eval  Hip flexion 3+/5 3/5  Hip extension    Hip abduction 3+/5 3/5  Hip adduction    Hip internal rotation    Hip external rotation    Knee flexion 5/5 5/5  Knee extension 5/5 5/5  Ankle dorsiflexion    Ankle plantarflexion    Ankle inversion    Ankle eversion     (Blank rows = not tested)  LOWER EXTREMITY SPECIAL TESTS:  Hip special tests: Hip scouring test: positive -  left  FUNCTIONAL TESTS:  30 Second Sit to Stand: 13 reps Tandem stance: 3 seconds each  GAIT: Distance walked: 32ft Assistive device utilized: None Level of assistance: Complete Independence Comments: able to change spee and turn quickly with only slight LoB  TREATMENT: [facility] Adult PT Treatment:                                                DATE: 01/27/23 Therapeutic Exercise: Nustep level 5 x 5 mins Standing hip abduction/extension 2x10 BIL Standing marching single UE support 2x30" Heel raises 2x10 Toe raises 2x10 Neuromuscular re-ed: Step ups onto Airex lat x10 BIL Step up from Airex to 6" step fwd Tandem walking fwd 1 UE support  Hurdle step overs fwd/lat step to and step over step x4 laps each  Hurdle step overs with airex in the middle - step to pattern fwd x2 laps   [facility] Adult PT Treatment:                                                DATE: 01/20/23 Therapeutic Exercise: Nustep level 5 x 5 mins Standing hip abduction/extension 2x10 BIL Standing marching single UE support 2x30" Heel raises 2x10 Toe raises 2x10 Neuromuscular re-ed: Step ups onto Airex fwd/lat x10 BIL Step up from Airex to 6" step fwd Tandem walking fwd 1 UE support    [facility] Adult PT Treatment:                                                DATE: 01/13/23 Therapeutic Exercise: Nustep level 5 x 5 mins Standing hip abduction/extension 2x10 BIL Standing marching UE support 2x30" Heel raises 2x10 Toe raises 2x10 STS - RTB around knees and mirror for feedback to prevent valgus collapse Neuromuscular re-ed: Step ups onto Airex fwd/lat x10 BIL Step up from Airex to 4" step fwd Tandem walking fwd 1 UE support  Hurdle step overs step to pattern fwd/lat - cues for hip/knee flexion as she tends to circumduct to clear hurdle   PATIENT EDUCATION:  Education details: eval findings, FOTO, HEP, POC Person educated: Patient Education method: Explanation, Demonstration, and Handouts Education comprehension:  verbalized understanding and returned demonstration  HOME EXERCISE PROGRAM: Access Code: [email or web address] URL: [email or web address] Date: 01/04/2023 Prepared by: Edwinna Areola  Exercises - Sidelying Hip Abduction  - 1 x daily - 7 x weekly - 2-3 sets - 10 reps - Active Straight Leg Raise with Quad Set  - 1 x daily - 7 x weekly - 2-3 sets -  10 reps - Standing Tandem Balance with Counter Support  - 1 x daily - 7 x weekly - 2-3 reps - 30 sec hold  ASSESSMENT:  CLINICAL IMPRESSION: Patient presents to PT reporting no new issues or falls since last session. Session today continued to focus on balance tasks and LE strengthening to reduce her fall risk. She is particularly challenged by hurdles, needing cues to not circumduct and to flex at hips/knees. Patient was able to tolerate all prescribed exercises with no adverse effects. Patient continues to benefit from skilled PT services and should be progressed as able to improve functional independence.     OBJECTIVE IMPAIRMENTS: Abnormal gait, decreased activity tolerance, decreased mobility, difficulty walking, and decreased strength.   ACTIVITY LIMITATIONS: carrying, lifting, squatting, stairs, and transfers  PARTICIPATION LIMITATIONS: shopping, community activity, occupation, and yard work  PERSONAL FACTORS: Time since onset of injury/illness/exacerbation and 1-2 comorbidities: DMII, HTN  are also affecting patient's functional outcome.   REHAB POTENTIAL: Excellent  CLINICAL DECISION MAKING: Stable/uncomplicated  EVALUATION COMPLEXITY: Low   GOALS: Goals reviewed with patient? No  SHORT TERM GOALS: Target date: 01/25/2023   Pt will be compliant and knowledgeable with initial HEP for improved comfort and carryover Baseline: initial HEP given  Goal status: INITIAL  LONG TERM GOALS: Target date: 03/01/2023   Pt will improve FOTO function score to no less than 63% as proxy for functional improvement Baseline: 59% function Goal  status: INITIAL   2.  Pt will improve LE MMT to no less than 4/5 for all tested motions for improved transfer ability and balance Baseline: see MMT chart Goal status: INITIAL  3.  Pt will increase 30 Second Sit to Stand rep count to no less than 15 reps for improved balance, strength, and functional mobility Baseline: 13 reps  Goal status: INITIAL   4.  Pt will be able to hold tandem stance for at least 30 seconds for improved narrow BoS balance and function Baseline: 3 seconds Goal status: INITIAL  PLAN:  PT FREQUENCY: 1x/week  PT DURATION: 6 weeks  PLANNED INTERVENTIONS: Therapeutic exercises, Therapeutic activity, Neuromuscular re-education, Balance training, Gait training, Patient/Family education, Self Care, Joint mobilization, Dry Needling, Electrical stimulation, Cryotherapy, Moist heat, Vasopneumatic device, Manual therapy, and Re-evaluation  PLAN FOR NEXT SESSION: assess HEP response, hip strengthening, static balance, obstacle navigation  Referring diagnosis?  R25.2 (ICD-10-CM) - Leg cramps R53.1 (ICD-10-CM) - Weakness  Treatment diagnosis? (if different than referring diagnosis)  Muscle weakness (generalized) Unsteadiness on feet  What was this (referring dx) caused by? []  Surgery [x]  Fall []  Ongoing issue []  Arthritis []  Other: ____________  Laterality: []  Rt []  Lt []  Both  Check all possible CPT codes:  *CHOOSE 10 OR LESS*    [x]  97110 (Therapeutic Exercise)  []  92507 (SLP Treatment)  [x]  97112 (Neuro Re-ed)   []  92526 (Swallowing Treatment)   [x]  97116 (Gait Training)   []  [id number] (Cognitive Training, 1st 15 minutes) [x]  97140 (Manual Therapy)   []  97130 (Cognitive Training, each add'l 15 minutes)  [x]  97164 (Re-evaluation)                              []  Other, List CPT Code ____________  [x]  97530 (Therapeutic Activities)     [x]  97535 (Self Care)   []  All codes above (97110 - 97535)  []  97012 (Mechanical Traction)  [x]  97014 (E-stim  Unattended)  [x]  97032 (E-stim manual)  []  97033 (Ionto)  []   16109 (Ultrasound) []  97750 (Physical Performance Training) []  [id number] (Aquatic Therapy) []  97016 (Vasopneumatic Device) []  [id number] (Paraffin) []  97034 (Contrast Bath) []  97597 (Wound Care 1st 20 sq cm) []  97598 (Wound Care each add'l 20 sq cm) []  97760 (Orthotic Fabrication, Fitting, Training Initial) []  [id number] (Prosthetic Management and Training Initial) []  [id number] (Orthotic or Prosthetic Training/ Modification Subsequent)   Berta Minor, PTA 01/27/2023, 4:58 PM

## 2023-02-03 ENCOUNTER — Ambulatory Visit: Payer: Medicare HMO

## 2023-02-03 DIAGNOSIS — M6281 Muscle weakness (generalized): Secondary | ICD-10-CM | POA: Diagnosis not present

## 2023-02-03 DIAGNOSIS — R2681 Unsteadiness on feet: Secondary | ICD-10-CM

## 2023-02-03 NOTE — Therapy (Signed)
OUTPATIENT PHYSICAL THERAPY TREATMENT NOTE   Patient Name: Becky Fields MRN: [id number] DOB:01/24/1946, 77 y.o., female Today's Date: 02/03/2023  END OF SESSION:  PT End of Session - 02/03/23 1616     Visit Number 5    Number of Visits 7    Date for PT Re-Evaluation 02/15/23    Authorization Type [employer] MCR    PT Start Time 1616    PT Stop Time 1656    PT Time Calculation (min) 40 min    Activity Tolerance Patient tolerated treatment well    Behavior During Therapy WFL for tasks assessed/performed                 Past Medical History:  Diagnosis Date   Cataract    Diabetes mellitus    Ejection fraction    .   Hepatitis C    Hypertension    Hypertriglyceridemia    Pneumonia    Takotsubo syndrome    October 16, 2012   Past Surgical History:  Procedure Laterality Date   CHOLECYSTECTOMY     COLONOSCOPY     EYE SURGERY  02/2012   bilateral cataract surg   LEFT HEART CATHETERIZATION WITH CORONARY ANGIOGRAM N/A 10/17/2012   Procedure: LEFT HEART CATHETERIZATION WITH CORONARY ANGIOGRAM;  Surgeon: Tonny Bollman, MD;  Location: [facility] CATH LAB;  Service: Cardiovascular;  Laterality: N/A;   ROBOTIC ASSISTED TOTAL HYSTERECTOMY WITH BILATERAL SALPINGO OOPHERECTOMY Bilateral 01/02/2021   Procedure: XI ROBOTIC ASSISTED TOTAL HYSTERECTOMY WITH BILATERAL SALPINGO OOPHORECTOMY AND PELVIC WASHINGS;  Surgeon: Adolphus Birchwood, MD;  Location: [facility];  Service: Gynecology;  Laterality: Bilateral;   Patient Active Problem List   Diagnosis Date Noted   Pelvic mass 12/24/2020   Urinary retention 12/18/2020   Low back pain 12/05/2020   Estrogen deficiency 12/05/2020   Plantar callus 06/27/2020   Trigger finger, acquired 11/18/2016   Hearing loss secondary to cerumen impaction, left 11/18/2016   Hepatic cirrhosis (HCC) 05/23/2014   Mitral regurgitation 10/18/2012   Takotsubo syndrome 10/18/2012   HYPERTRIGLYCERIDEMIA 01/25/2009   INSOMNIA, PERSISTENT 01/25/2009   Type 2 diabetes mellitus  with peripheral neuropathy (HCC) 04/13/2008   History of colonic polyps 05/02/2007   Chronic hepatitis C without hepatic coma (HCC) 03/07/2007   HYPERTENSION, BENIGN ESSENTIAL 01/28/2007    PCP: Caro Laroche, DO   REFERRING PROVIDER: Caro Laroche, DO   REFERRING DIAG:  R25.2 (ICD-10-CM) - Leg cramps R53.1 (ICD-10-CM) - Weakness  THERAPY DIAG:  Muscle weakness (generalized)  Unsteadiness on feet  Rationale for Evaluation and Treatment: Rehabilitation  ONSET DATE: Chronic  SUBJECTIVE:   SUBJECTIVE STATEMENT: Pt presents to PT with no current reports of pain or discomfort. Notes she fell last week while putting on her clothes but was not injured. Feels she is getting better and would like a few more weeks if possible.   PERTINENT HISTORY: DMII, HTN  PAIN:  Are you having pain?  No: NPRS scale: 0/10  PRECAUTIONS: Fall  WEIGHT BEARING RESTRICTIONS: No  FALLS:  Has patient fallen in last 6 months? Yes. 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  PLOF: Independent  PATIENT GOALS: improve strength and balance, decrease number of falls  OBJECTIVE:   DIAGNOSTIC FINDINGS:   N/A  PATIENT SURVEYS:  FOTO: 59% function;  63% predcited  COGNITION: Overall cognitive status: Within functional limits for tasks assessed     SENSATION: Light touch: Impaired  - bilateral feet (peripheral neruopathy)    POSTURE: rounded shoulders and forward head  PALPATION: N/A  LOWER EXTREMITY MMT:  MMT Right eval Left eval  Hip flexion 3+/5 3/5  Hip extension    Hip abduction 3+/5 3/5  Hip adduction    Hip internal rotation    Hip external rotation    Knee flexion 5/5 5/5  Knee extension 5/5 5/5  Ankle dorsiflexion    Ankle  plantarflexion    Ankle inversion    Ankle eversion     (Blank rows = not tested)  LOWER EXTREMITY SPECIAL TESTS:  Hip special tests: Hip scouring test: positive - left  FUNCTIONAL TESTS:  30 Second Sit to Stand: 13 reps Tandem stance: 3 seconds each  GAIT: Distance walked: 90ft Assistive device utilized: None Level of assistance: Complete Independence Comments: able to change spee and turn quickly with only slight LoB  TREATMENT: [facility] Adult PT Treatment:                                                DATE: 02/03/23 Therapeutic Exercise: Lateral walk YTB x 2 laps in // Standing hip abduction/extension 2x10 BIL YTB Standing marching single UE support 2x30" Heel raises x 15 Toe raises x 15 Step up 2x10 8in 1 UE STS 2x10 - low table no UE Bridge 2x10  Supine SLR 2x10 2# S/L hip abd x 15 each Neuromuscular re-ed: Tandem on foam x 30" each FT EO on foam x 30" Tandem walking fwd 1 UE support  x 2 laps Hurdle step overs fwd/lat step to and step over step x4 laps each   [facility] Adult PT Treatment:                                                DATE: 01/27/23 Therapeutic Exercise: Nustep level 5 x 5 mins Standing hip abduction/extension 2x10 BIL Standing marching single UE support 2x30" Heel raises 2x10 Toe raises 2x10 Neuromuscular re-ed: Step ups onto Airex lat x10 BIL Step up from Airex to 6" step fwd Tandem walking fwd 1 UE support  Hurdle step overs fwd/lat step to and step over step x4 laps each  Hurdle step overs with airex in the middle - step to pattern fwd x2 laps   [facility] Adult PT Treatment:                                                DATE: 01/20/23 Therapeutic Exercise: Nustep level 5 x 5 mins Standing hip abduction/extension 2x10 BIL Standing marching single UE support 2x30" Heel raises 2x10 Toe raises 2x10 Neuromuscular re-ed: Step ups onto Airex fwd/lat x10 BIL Step up from Airex to 6" step fwd Tandem walking fwd 1 UE support   PATIENT EDUCATION:   Education details: eval findings, FOTO, HEP, POC Person educated: Patient Education method: Explanation, Demonstration, and Handouts Education comprehension: verbalized understanding and returned demonstration  HOME EXERCISE PROGRAM: Access Code: [email or web address] URL: [email or web address] Date: 02/03/2023  Prepared by: Edwinna Areola  Exercises - Sidelying Hip Abduction  - 3-4 x weekly - 2-3 sets - 10 reps - Active Straight Leg Raise with Quad Set  - 3-4 x weekly - 2-3 sets - 10 reps - Standing Tandem Balance with Counter Support  - 3-4 x weekly - 2-3 reps - 30 sec hold - Tandem Walking with Counter Support  - 3-4 x weekly - 3 sets - Side Stepping with Resistance at Ankles and Counter Support  - 3-4 x weekly - 3 reps - yellow  hold  ASSESSMENT:  CLINICAL IMPRESSION: Pt was able to complete all prescribed exercises with no adverse effect. Continued to focus on LE strength and improving balance. Pt continues to benefit from skilled PT services, will progress as able with once every other week frequency.   OBJECTIVE IMPAIRMENTS: Abnormal gait, decreased activity tolerance, decreased mobility, difficulty walking, and decreased strength.   ACTIVITY LIMITATIONS: carrying, lifting, squatting, stairs, and transfers  PARTICIPATION LIMITATIONS: shopping, community activity, occupation, and yard work  PERSONAL FACTORS: Time since onset of injury/illness/exacerbation and 1-2 comorbidities: DMII, HTN  are also affecting patient's functional outcome.    GOALS: Goals reviewed with patient? No  SHORT TERM GOALS: Target date: 01/25/2023   Pt will be compliant and knowledgeable with initial HEP for improved comfort and carryover Baseline: initial HEP given  Goal status: INITIAL  LONG TERM GOALS: Target date: 03/01/2023   Pt will improve FOTO function score to no less than 63% as proxy for functional improvement Baseline: 59% function Goal status: INITIAL   2.  Pt will improve LE MMT to  no less than 4/5 for all tested motions for improved transfer ability and balance Baseline: see MMT chart Goal status: INITIAL  3.  Pt will increase 30 Second Sit to Stand rep count to no less than 15 reps for improved balance, strength, and functional mobility Baseline: 13 reps  Goal status: INITIAL   4.  Pt will be able to hold tandem stance for at least 30 seconds for improved narrow BoS balance and function Baseline: 3 seconds Goal status: INITIAL  PLAN:  PT FREQUENCY: every other week  PT DURATION: 6 weeks  PLANNED INTERVENTIONS: Therapeutic exercises, Therapeutic activity, Neuromuscular re-education, Balance training, Gait training, Patient/Family education, Self Care, Joint mobilization, Dry Needling, Electrical stimulation, Cryotherapy, Moist heat, Vasopneumatic device, Manual therapy, and Re-evaluation  PLAN FOR NEXT SESSION: assess HEP response, hip strengthening, static balance, obstacle navigation  Referring diagnosis?  R25.2 (ICD-10-CM) - Leg cramps R53.1 (ICD-10-CM) - Weakness  Treatment diagnosis? (if different than referring diagnosis)  Muscle weakness (generalized) Unsteadiness on feet  What was this (referring dx) caused by? []  Surgery [x]  Fall []  Ongoing issue []  Arthritis []  Other: ____________  Laterality: []  Rt []  Lt []  Both  Check all possible CPT codes:  *CHOOSE 10 OR LESS*    [x]  01093 (Therapeutic Exercise)  []  92507 (SLP Treatment)  [x]  97112 (Neuro Re-ed)   []  92526 (Swallowing Treatment)   [x]  97116 (Gait Training)   []  [id number] (Cognitive Training, 1st 15 minutes) [x]  97140 (Manual Therapy)   []  97130 (Cognitive Training, each add'l 15 minutes)  [x]  97164 (Re-evaluation)                              []  Other, List CPT Code ____________  [x]  97530 (Therapeutic Activities)     [x]  97535 (Self Care)   []  All codes above (97110 - 97535)  []   13086 (Mechanical Traction)  [x]  97014 (E-stim Unattended)  [x]  97032 (E-stim manual)  []  97033  (Ionto)  []  97035 (Ultrasound) []  97750 (Physical Performance Training) []  [id number] (Aquatic Therapy) []  97016 (Vasopneumatic Device) []  [id number] (Paraffin) []  97034 (Contrast Bath) []  97597 (Wound Care 1st 20 sq cm) []  57846 (Wound Care each add'l 20 sq cm) []  97760 (Orthotic Fabrication, Fitting, Training Initial) []  [id number] (Prosthetic Management and Training Initial) []  [id number] (Orthotic or Prosthetic Training/ Modification Subsequent)   Eloy End, PT 02/03/2023, 6:06 PM

## 2023-02-11 ENCOUNTER — Other Ambulatory Visit: Payer: Self-pay

## 2023-02-11 ENCOUNTER — Encounter: Payer: Self-pay | Admitting: Podiatry

## 2023-02-11 ENCOUNTER — Ambulatory Visit: Payer: Medicare HMO | Admitting: Podiatry

## 2023-02-11 DIAGNOSIS — E1142 Type 2 diabetes mellitus with diabetic polyneuropathy: Secondary | ICD-10-CM | POA: Diagnosis not present

## 2023-02-11 DIAGNOSIS — M79675 Pain in left toe(s): Secondary | ICD-10-CM

## 2023-02-11 DIAGNOSIS — B351 Tinea unguium: Secondary | ICD-10-CM | POA: Diagnosis not present

## 2023-02-11 DIAGNOSIS — L84 Corns and callosities: Secondary | ICD-10-CM | POA: Diagnosis not present

## 2023-02-11 DIAGNOSIS — M79674 Pain in right toe(s): Secondary | ICD-10-CM | POA: Diagnosis not present

## 2023-02-11 DIAGNOSIS — I10 Essential (primary) hypertension: Secondary | ICD-10-CM

## 2023-02-11 MED ORDER — LOSARTAN POTASSIUM 100 MG PO TABS: 100.0000 mg | ORAL_TABLET | Freq: Every day | ORAL | 3 refills | Status: DC

## 2023-02-11 NOTE — Progress Notes (Signed)
  Subjective:  Patient ID: Becky Fields, female    DOB: August 20, 1945,  MRN: [id number]  No chief complaint on file.   77 y.o. female presents with the above complaint. History confirmed with patient.  Callus on toe on right foot has built up again, causing some tenderness.  The nails are thickened elongated causing pain and discomfort.  Having some increasing pain on the bottom of the right hallux  Objective:  Physical Exam: warm, good capillary refill, no trophic changes or ulcerative lesions, normal DP and PT pulses and normal sensory exam.    Bilateral submetatarsal 1 callus Left foot: Medial pinch callus of the hallux, thickened elongated discolored dystrophic nails with subungual debris Right Foot: Plantar IPJ callus hallux, contracted hammertoe digit of third toe with preulcerative callus distal tip that is painful.  Toe is semireducible, thickened elongated discolored dystrophic nails with subungual debris.  Hallux valgus deformity  Assessment:   1. Callus of foot   2. Pain due to onychomycosis of toenails of both feet   3. Type 2 diabetes mellitus with peripheral neuropathy (HCC)           Plan:  Patient was evaluated and treated and all questions answered.  All symptomatic hyperkeratoses were safely debrided with a sterile #15 blade to patient's level of comfort without incident. We discussed preventative and palliative care of these lesions including supportive and accommodative shoegear, padding, prefabricated and custom molded orthoses and offloading silicone pads.  She is also using urea cream on this that has helped  Discussed the etiology and treatment options for the condition in detail with the patient. Recommended debridement of the nails today. Sharp and mechanical debridement performed of all painful and mycotic nails today. Nails debrided in length and thickness using a nail nipper to level of comfort. Discussed treatment options including appropriate shoe gear.  Follow up as needed for painful nails.    Return in about 3 months (around 05/14/2023) for at risk diabetic foot care.

## 2023-02-13 ENCOUNTER — Other Ambulatory Visit: Payer: Self-pay | Admitting: Family Medicine

## 2023-02-13 DIAGNOSIS — E1142 Type 2 diabetes mellitus with diabetic polyneuropathy: Secondary | ICD-10-CM

## 2023-02-17 ENCOUNTER — Ambulatory Visit: Payer: Medicare HMO

## 2023-02-17 DIAGNOSIS — M6281 Muscle weakness (generalized): Secondary | ICD-10-CM | POA: Diagnosis not present

## 2023-02-17 DIAGNOSIS — R2681 Unsteadiness on feet: Secondary | ICD-10-CM

## 2023-02-17 NOTE — Therapy (Signed)
OUTPATIENT PHYSICAL THERAPY TREATMENT NOTE   Patient Name: Becky Fields MRN: [id number] DOB:Dec 18, 1945, 77 y.o., female Today's Date: 02/18/2023  END OF SESSION:  PT End of Session - 02/17/23 1610     Visit Number 6    Number of Visits 7    Date for PT Re-Evaluation 03/17/23    Authorization Type [employer] MCR    PT Start Time 1615    PT Stop Time 1655    PT Time Calculation (min) 40 min    Activity Tolerance Patient tolerated treatment well    Behavior During Therapy WFL for tasks assessed/performed                  Past Medical History:  Diagnosis Date   Cataract    Diabetes mellitus    Ejection fraction    .   Hepatitis C    Hypertension    Hypertriglyceridemia    Pneumonia    Takotsubo syndrome    October 16, 2012   Past Surgical History:  Procedure Laterality Date   CHOLECYSTECTOMY     COLONOSCOPY     EYE SURGERY  02/2012   bilateral cataract surg   LEFT HEART CATHETERIZATION WITH CORONARY ANGIOGRAM N/A 10/17/2012   Procedure: LEFT HEART CATHETERIZATION WITH CORONARY ANGIOGRAM;  Surgeon: Tonny Bollman, MD;  Location: [facility] CATH LAB;  Service: Cardiovascular;  Laterality: N/A;   ROBOTIC ASSISTED TOTAL HYSTERECTOMY WITH BILATERAL SALPINGO OOPHERECTOMY Bilateral 01/02/2021   Procedure: XI ROBOTIC ASSISTED TOTAL HYSTERECTOMY WITH BILATERAL SALPINGO OOPHORECTOMY AND PELVIC WASHINGS;  Surgeon: Adolphus Birchwood, MD;  Location: [facility];  Service: Gynecology;  Laterality: Bilateral;   Patient Active Problem List   Diagnosis Date Noted   Pelvic mass 12/24/2020   Urinary retention 12/18/2020   Low back pain 12/05/2020   Estrogen deficiency 12/05/2020   Plantar callus 06/27/2020   Trigger finger, acquired 11/18/2016   Hearing loss secondary to cerumen impaction, left 11/18/2016   Hepatic cirrhosis (HCC) 05/23/2014   Mitral regurgitation 10/18/2012   Takotsubo syndrome 10/18/2012   HYPERTRIGLYCERIDEMIA 01/25/2009   INSOMNIA, PERSISTENT 01/25/2009   Type 2 diabetes mellitus  with peripheral neuropathy (HCC) 04/13/2008   History of colonic polyps 05/02/2007   Chronic hepatitis C without hepatic coma (HCC) 03/07/2007   HYPERTENSION, BENIGN ESSENTIAL 01/28/2007    PCP: Caro Laroche, DO   REFERRING PROVIDER: Caro Laroche, DO   REFERRING DIAG:  R25.2 (ICD-10-CM) - Leg cramps R53.1 (ICD-10-CM) - Weakness  THERAPY DIAG:  Muscle weakness (generalized) - Plan: PT plan of care cert/re-cert  Unsteadiness on feet - Plan: PT plan of care cert/re-cert  Rationale for Evaluation and Treatment: Rehabilitation  ONSET DATE: Chronic  SUBJECTIVE:   SUBJECTIVE STATEMENT: Pt presents to PT with no current reports of pain or discomfort. Notes she fell last week while putting on her clothes but was not injured. Feels she is getting better and would like a few more weeks if possible.   PERTINENT HISTORY: DMII, HTN  PAIN:  Are you having pain?  No: NPRS scale: 0/10  PRECAUTIONS: Fall  WEIGHT BEARING RESTRICTIONS: No  FALLS:  Has patient fallen in last 6 months? Yes. 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  PLOF: Independent  PATIENT GOALS: improve strength and balance, decrease number of falls  OBJECTIVE:   DIAGNOSTIC FINDINGS:   N/A  PATIENT SURVEYS:  FOTO: 59% function; 63% predcited  COGNITION: Overall cognitive status: Within functional limits for tasks assessed     SENSATION: Light touch: Impaired  - bilateral feet (peripheral neruopathy)    POSTURE: rounded shoulders and forward head  PALPATION: N/A  LOWER EXTREMITY MMT:  MMT Right eval Left eval  Hip flexion 3+/5 3/5  Hip extension    Hip abduction 3+/5 3/5  Hip adduction    Hip internal rotation    Hip external rotation    Knee  flexion 5/5 5/5  Knee extension 5/5 5/5  Ankle dorsiflexion    Ankle plantarflexion    Ankle inversion    Ankle eversion     (Blank rows = not tested)  LOWER EXTREMITY SPECIAL TESTS:  Hip special tests: Hip scouring test: positive - left  FUNCTIONAL TESTS:  30 Second Sit to Stand: 13 reps Tandem stance: 3 seconds each  GAIT: Distance walked: 45ft Assistive device utilized: None Level of assistance: Complete Independence Comments: able to change spee and turn quickly with only slight LoB  TREATMENT: [facility] Adult PT Treatment:                                                DATE: 02/03/23 Therapeutic Exercise: Lateral walk YTB x 2 laps in // Standing hip abduction/extension 2x10 BIL YTB Standing marching single UE support 2x30" Heel raises x 15 Toe raises x 15 Step up 2x10 8in 1 UE STS 2x10 - low table no UE Bridge 2x10  Supine SLR 2x10 2# S/L hip abd x 15 each Neuromuscular re-ed: Tandem on foam x 30" each FT EO on foam x 30" Tandem walking fwd 1 UE support  x 2 laps Hurdle step overs fwd/lat step to and step over step x4 laps each   [facility] Adult PT Treatment:                                                DATE: 01/27/23 Therapeutic Exercise: Nustep level 5 x 5 mins Standing hip abduction/extension 2x10 BIL Standing marching single UE support 2x30" Heel raises 2x10 Toe raises 2x10 Neuromuscular re-ed: Step ups onto Airex lat x10 BIL Step up from Airex to 6" step fwd Tandem walking fwd 1 UE support  Hurdle step overs fwd/lat step to and step over step x4 laps each  Hurdle step overs with airex in the middle - step to pattern fwd x2 laps   [facility] Adult PT Treatment:                                                DATE: 01/20/23 Therapeutic Exercise: Nustep level 5 x 5 mins Standing hip abduction/extension 2x10 BIL Standing marching single UE support 2x30" Heel raises 2x10 Toe raises 2x10 Neuromuscular re-ed: Step ups onto Airex fwd/lat x10 BIL Step up from Airex to 6"  step fwd Tandem walking fwd 1 UE support   PATIENT EDUCATION:  Education details: eval findings, FOTO, HEP, POC Person educated: Patient Education method: Explanation, Demonstration, and Handouts Education comprehension: verbalized  understanding and returned demonstration  HOME EXERCISE PROGRAM: Access Code: [email or web address] URL: [email or web address] Date: 02/03/2023 Prepared by: Edwinna Areola  Exercises - Sidelying Hip Abduction  - 3-4 x weekly - 2-3 sets - 10 reps - Active Straight Leg Raise with Quad Set  - 3-4 x weekly - 2-3 sets - 10 reps - Standing Tandem Balance with Counter Support  - 3-4 x weekly - 2-3 reps - 30 sec hold - Tandem Walking with Counter Support  - 3-4 x weekly - 3 sets - Side Stepping with Resistance at Ankles and Counter Support  - 3-4 x weekly - 3 reps - yellow  hold  ASSESSMENT:  CLINICAL IMPRESSION: Pt was able to complete all prescribed exercises with no adverse effect. Continued to focus on LE strength and improving balance. Pt continues to benefit from skilled PT services, will progress as able with once every other week frequency.   OBJECTIVE IMPAIRMENTS: Abnormal gait, decreased activity tolerance, decreased mobility, difficulty walking, and decreased strength.   ACTIVITY LIMITATIONS: carrying, lifting, squatting, stairs, and transfers  PARTICIPATION LIMITATIONS: shopping, community activity, occupation, and yard work  PERSONAL FACTORS: Time since onset of injury/illness/exacerbation and 1-2 comorbidities: DMII, HTN  are also affecting patient's functional outcome.    GOALS: Goals reviewed with patient? No  SHORT TERM GOALS: Target date: 01/25/2023   Pt will be compliant and knowledgeable with initial HEP for improved comfort and carryover Baseline: initial HEP given  Goal status: MET  LONG TERM GOALS: Target date: 03/17/2023   Pt will improve FOTO function score to no less than 63% as proxy for functional improvement Baseline: 59%  function 02/17/2023: 50% function Goal status: IN PROGRESS   2.  Pt will improve LE MMT to no less than 4/5 for all tested motions for improved transfer ability and balance Baseline: see MMT chart Goal status: IN PROGRESS  3.  Pt will increase 30 Second Sit to Stand rep count to no less than 15 reps for improved balance, strength, and functional mobility Baseline: 13 reps  Goal status: INITIAL   4.  Pt will be able to hold tandem stance for at least 30 seconds for improved narrow BoS balance and function Baseline: 3 seconds Goal status: INITIAL  PLAN:  PT FREQUENCY: every other week  PT DURATION: 6 weeks  PLANNED INTERVENTIONS: Therapeutic exercises, Therapeutic activity, Neuromuscular re-education, Balance training, Gait training, Patient/Family education, Self Care, Joint mobilization, Dry Needling, Electrical stimulation, Cryotherapy, Moist heat, Vasopneumatic device, Manual therapy, and Re-evaluation  PLAN FOR NEXT SESSION: assess HEP response, hip strengthening, static balance, obstacle navigation  Referring diagnosis?  R25.2 (ICD-10-CM) - Leg cramps R53.1 (ICD-10-CM) - Weakness  Treatment diagnosis? (if different than referring diagnosis)  Muscle weakness (generalized) Unsteadiness on feet  What was this (referring dx) caused by? []  Surgery [x]  Fall []  Ongoing issue []  Arthritis []  Other: ____________  Laterality: []  Rt []  Lt []  Both  Check all possible CPT codes:  *CHOOSE 10 OR LESS*    [x]  97110 (Therapeutic Exercise)  []  54098 (SLP Treatment)  [x]  97112 (Neuro Re-ed)   []  92526 (Swallowing Treatment)   [x]  97116 (Gait Training)   []  [id number] (Cognitive Training, 1st 15 minutes) [x]  97140 (Manual Therapy)   []  97130 (Cognitive Training, each add'l 15 minutes)  [x]  97164 (Re-evaluation)                              []  Other, List CPT Code ____________  [x]   40981 (Therapeutic Activities)     [x]  97535 (Self Care)   []  All codes above (97110 - 97535)  []  97012  (Mechanical Traction)  [x]  19147 (E-stim Unattended)  [x]  97032 (E-stim manual)  []  97033 (Ionto)  []  97035 (Ultrasound) []  97750 (Physical Performance Training) []  [id number] (Aquatic Therapy) []  97016 (Vasopneumatic Device) []  [id number] (Paraffin) []  97034 (Contrast Bath) []  97597 (Wound Care 1st 20 sq cm) []  97598 (Wound Care each add'l 20 sq cm) []  97760 (Orthotic Fabrication, Fitting, Training Initial) []  [id number] (Prosthetic Management and Training Initial) []  [id number] (Orthotic or Prosthetic Training/ Modification Subsequent)   Eloy End, PT 02/18/2023, 11:04 AM

## 2023-03-03 ENCOUNTER — Ambulatory Visit: Payer: Medicare HMO | Attending: Family Medicine

## 2023-03-03 DIAGNOSIS — M6281 Muscle weakness (generalized): Secondary | ICD-10-CM | POA: Insufficient documentation

## 2023-03-03 DIAGNOSIS — R2681 Unsteadiness on feet: Secondary | ICD-10-CM | POA: Diagnosis not present

## 2023-03-03 NOTE — Therapy (Signed)
OUTPATIENT PHYSICAL THERAPY TREATMENT NOTE   Patient Name: Becky Fields MRN: [id number] DOB:[birth year], 77 y.o., female Today's Date: 03/03/2023  END OF SESSION:  PT End of Session - 03/03/23 1615     Visit Number 7    Number of Visits 7    Date for PT Re-Evaluation 03/17/23    Authorization Type [employer] MCR    PT Start Time 1615    PT Stop Time 1655    PT Time Calculation (min) 40 min    Activity Tolerance Patient tolerated treatment well    Behavior During Therapy WFL for tasks assessed/performed               Past Medical History:  Diagnosis Date   Cataract    Diabetes mellitus    Ejection fraction    .   Hepatitis C    Hypertension    Hypertriglyceridemia    Pneumonia    Takotsubo syndrome    October 16, 2012   Past Surgical History:  Procedure Laterality Date   CHOLECYSTECTOMY     COLONOSCOPY     EYE SURGERY  02/2012   bilateral cataract surg   LEFT HEART CATHETERIZATION WITH CORONARY ANGIOGRAM N/A 10/17/2012   Procedure: LEFT HEART CATHETERIZATION WITH CORONARY ANGIOGRAM;  Surgeon: Tonny Bollman, MD;  Location: [facility] CATH LAB;  Service: Cardiovascular;  Laterality: N/A;   ROBOTIC ASSISTED TOTAL HYSTERECTOMY WITH BILATERAL SALPINGO OOPHERECTOMY Bilateral 01/02/2021   Procedure: XI ROBOTIC ASSISTED TOTAL HYSTERECTOMY WITH BILATERAL SALPINGO OOPHORECTOMY AND PELVIC WASHINGS;  Surgeon: Adolphus Birchwood, MD;  Location: [facility];  Service: Gynecology;  Laterality: Bilateral;   Patient Active Problem List   Diagnosis Date Noted   Pelvic mass 12/24/2020   Urinary retention 12/18/2020   Low back pain 12/05/2020   Estrogen deficiency 12/05/2020   Plantar callus 06/27/2020   Trigger finger, acquired 11/18/2016   Hearing loss secondary to cerumen impaction, left 11/18/2016   Hepatic cirrhosis (HCC) 05/23/2014   Mitral regurgitation 10/18/2012   Takotsubo syndrome 10/18/2012   HYPERTRIGLYCERIDEMIA 01/25/2009   INSOMNIA, PERSISTENT 01/25/2009   Type 2 diabetes mellitus with  peripheral neuropathy (HCC) 04/13/2008   History of colonic polyps 05/02/2007   Chronic hepatitis C without hepatic coma (HCC) 03/07/2007   HYPERTENSION, BENIGN ESSENTIAL 01/28/2007    PCP: Caro Laroche, DO   REFERRING PROVIDER: Caro Laroche, DO   REFERRING DIAG:  R25.2 (ICD-10-CM) - Leg cramps R53.1 (ICD-10-CM) - Weakness  THERAPY DIAG:  Muscle weakness (generalized)  Unsteadiness on feet  Rationale for Evaluation and Treatment: Rehabilitation  ONSET DATE: Chronic  SUBJECTIVE:   SUBJECTIVE STATEMENT: Patient reports that she has fallen twice since last session, once tripping over a threshold at home and another at work when she ran into something. No injuries from these incidents.   PERTINENT HISTORY: DMII, HTN  PAIN:  Are you having pain?  No: NPRS scale: 0/10  PRECAUTIONS: Fall  WEIGHT BEARING RESTRICTIONS: No  FALLS:  Has patient fallen in last 6 months? Yes. 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  PLOF: Independent  PATIENT GOALS: improve strength and balance, decrease number of falls  OBJECTIVE:   DIAGNOSTIC FINDINGS:   N/A  PATIENT SURVEYS:  FOTO: 59% function; 63% predcited  COGNITION: Overall cognitive status: Within functional limits for  tasks assessed     SENSATION: Light touch: Impaired  - bilateral feet (peripheral neruopathy)    POSTURE: rounded shoulders and forward head  PALPATION: N/A  LOWER EXTREMITY MMT:  MMT Right eval Left eval  Hip flexion 3+/5 3/5  Hip extension    Hip abduction 3+/5 3/5  Hip adduction    Hip internal rotation    Hip external rotation    Knee flexion 5/5 5/5  Knee extension 5/5 5/5  Ankle dorsiflexion    Ankle plantarflexion    Ankle inversion     Ankle eversion     (Blank rows = not tested)  LOWER EXTREMITY SPECIAL TESTS:  Hip special tests: Hip scouring test: positive - left  FUNCTIONAL TESTS:  30 Second Sit to Stand: 13 reps Tandem stance: 3 seconds each  GAIT: Distance walked: 27ft Assistive device utilized: None Level of assistance: Complete Independence Comments: able to change spee and turn quickly with only slight LoB  TREATMENT: [facility] Adult PT Treatment:                                                DATE: 03/03/23 Therapeutic Exercise: Nustep level 5 x 5 mins while gathering subjective info Lateral walk YTB x 3 laps Standing hip abduction/extension 2x10 BIL YTB Standing marching single UE support 2x30" Heel raises 2x10 Toe raises 2x10 STS 2x10 - low table no UE Bridge 2x10  Supine SLR 2x10 2# S/L hip abd x 15 each - pain on Lt Neuromuscular re-ed: Step up from Airex to 6" step fwd x10 BIL Tandem on foam x 30" each FT EO on foam x 30", head turns x30", EC x20" close CGA Tandem walking fwd 1 UE support  x 2 laps - trial with no UE, very unsteady, unable to keep form   [facility] Adult PT Treatment:                                                DATE: 02/03/23 Therapeutic Exercise: Lateral walk YTB x 2 laps in // Standing hip abduction/extension 2x10 BIL YTB Standing marching single UE support 2x30" Heel raises x 15 Toe raises x 15 Step up 2x10 8in 1 UE STS 2x10 - low table no UE Bridge 2x10  Supine SLR 2x10 2# S/L hip abd x 15 each Neuromuscular re-ed: Tandem on foam x 30" each FT EO on foam x 30" Tandem walking fwd 1 UE support  x 2 laps Hurdle step overs fwd/lat step to and step over step x4 laps each   [facility] Adult PT Treatment:                                                DATE: 01/27/23 Therapeutic Exercise: Nustep level 5 x 5 mins Standing hip abduction/extension 2x10 BIL Standing marching single UE support 2x30" Heel raises 2x10 Toe raises 2x10 Neuromuscular re-ed: Step ups onto Airex lat x10  BIL Step up from Airex to 6" step fwd Tandem walking fwd 1 UE support  Hurdle step overs fwd/lat step to and step over step x4 laps each  Hurdle  step overs with airex in the middle - step to pattern fwd x2 laps     PATIENT EDUCATION:  Education details: eval findings, FOTO, HEP, POC Person educated: Patient Education method: Explanation, Demonstration, and Handouts Education comprehension: verbalized understanding and returned demonstration  HOME EXERCISE PROGRAM: Access Code: [email or web address] URL: [email or web address] Date: 02/03/2023 Prepared by: Edwinna Areola  Exercises - Sidelying Hip Abduction  - 3-4 x weekly - 2-3 sets - 10 reps - Active Straight Leg Raise with Quad Set  - 3-4 x weekly - 2-3 sets - 10 reps - Standing Tandem Balance with Counter Support  - 3-4 x weekly - 2-3 reps - 30 sec hold - Tandem Walking with Counter Support  - 3-4 x weekly - 3 sets - Side Stepping with Resistance at Ankles and Counter Support  - 3-4 x weekly - 3 reps - yellow  hold  ASSESSMENT:  CLINICAL IMPRESSION: Patient reports 2 falls since last session, both mechanical in nature, no injuries from these incidents. Session today continued to focus on LE strengthening and balance tasks to decrease fall risk. Attempted tandem walking with no UE support but she is unable to keep form and has marked increase in unsteadiness. Patient was able to tolerate all prescribed exercises with no adverse effects. Patient continues to benefit from skilled PT services and should be progressed as able to improve functional independence.    OBJECTIVE IMPAIRMENTS: Abnormal gait, decreased activity tolerance, decreased mobility, difficulty walking, and decreased strength.   ACTIVITY LIMITATIONS: carrying, lifting, squatting, stairs, and transfers  PARTICIPATION LIMITATIONS: shopping, community activity, occupation, and yard work  PERSONAL FACTORS: Time since onset of injury/illness/exacerbation and 1-2  comorbidities: DMII, HTN  are also affecting patient's functional outcome.    GOALS: Goals reviewed with patient? No  SHORT TERM GOALS: Target date: 01/25/2023   Pt will be compliant and knowledgeable with initial HEP for improved comfort and carryover Baseline: initial HEP given  Goal status: MET  LONG TERM GOALS: Target date: 03/17/2023   Pt will improve FOTO function score to no less than 63% as proxy for functional improvement Baseline: 59% function 02/17/2023: 50% function Goal status: IN PROGRESS   2.  Pt will improve LE MMT to no less than 4/5 for all tested motions for improved transfer ability and balance Baseline: see MMT chart Goal status: IN PROGRESS  3.  Pt will increase 30 Second Sit to Stand rep count to no less than 15 reps for improved balance, strength, and functional mobility Baseline: 13 reps  Goal status: INITIAL   4.  Pt will be able to hold tandem stance for at least 30 seconds for improved narrow BoS balance and function Baseline: 3 seconds Goal status: INITIAL  PLAN:  PT FREQUENCY: every other week  PT DURATION: 6 weeks  PLANNED INTERVENTIONS: Therapeutic exercises, Therapeutic activity, Neuromuscular re-education, Balance training, Gait training, Patient/Family education, Self Care, Joint mobilization, Dry Needling, Electrical stimulation, Cryotherapy, Moist heat, Vasopneumatic device, Manual therapy, and Re-evaluation  PLAN FOR NEXT SESSION: assess HEP response, hip strengthening, static balance, obstacle navigation  Referring diagnosis?  R25.2 (ICD-10-CM) - Leg cramps R53.1 (ICD-10-CM) - Weakness  Treatment diagnosis? (if different than referring diagnosis)  Muscle weakness (generalized) Unsteadiness on feet  What was this (referring dx) caused by? []  Surgery [x]  Fall []  Ongoing issue []  Arthritis []  Other: ____________  Laterality: []  Rt []  Lt []  Both  Check all possible CPT codes:  *CHOOSE 10 OR LESS*    [x]  32202 (Therapeutic  Exercise)  []   52841 (SLP Treatment)  [x]  [phone] (Neuro Re-ed)   []  92526 (Swallowing Treatment)   [x]  [phone] (Gait Training)   []  [phone] (Cognitive Training, 1st 15 minutes) [x]  97140 (Manual Therapy)   []  97130 (Cognitive Training, each add'l 15 minutes)  [x]  97164 (Re-evaluation)                              []  Other, List CPT Code ____________  [x]  97530 (Therapeutic Activities)     [x]  97535 (Self Care)   []  All codes above (97110 - 97535)  []  97012 (Mechanical Traction)  [x]  97014 (E-stim Unattended)  [x]  97032 (E-stim manual)  []  97033 (Ionto)  []  97035 (Ultrasound) []  97750 (Physical Performance Training) []  40347 (Aquatic Therapy) []  97016 (Vasopneumatic Device) []  [id number] (Paraffin) []  97034 (Contrast Bath) []  97597 (Wound Care 1st 20 sq cm) []  97598 (Wound Care each add'l 20 sq cm) []  97760 (Orthotic Fabrication, Fitting, Training Initial) []  [id number] (Prosthetic Management and Training Initial) []  [id number] (Orthotic or Prosthetic Training/ Modification Subsequent)   Berta Minor, PTA 03/03/2023, 4:55 PM

## 2023-03-17 ENCOUNTER — Ambulatory Visit: Payer: Medicare HMO

## 2023-03-17 DIAGNOSIS — R2681 Unsteadiness on feet: Secondary | ICD-10-CM

## 2023-03-17 DIAGNOSIS — M6281 Muscle weakness (generalized): Secondary | ICD-10-CM | POA: Diagnosis not present

## 2023-03-17 NOTE — Therapy (Signed)
OUTPATIENT PHYSICAL THERAPY TREATMENT NOTE/DISCHARGE  PHYSICAL THERAPY DISCHARGE SUMMARY  Visits from Start of Care: 8  Current functional level related to goals / functional outcomes: See goals and objective   Remaining deficits: See goals and objective   Education / Equipment: HEP   Patient agrees to discharge. Patient goals were not met. Patient is being discharged due to maximized rehab potential.    Patient Name: Becky Fields MRN: [id number] DOB:[birth year], 77 y.o., female Today's Date: 03/18/2023  END OF SESSION:  PT End of Session - 03/17/23 1628     Visit Number 8    Number of Visits 7    Date for PT Re-Evaluation 03/17/23    Authorization Type [employer] MCR    PT Start Time 1615    PT Stop Time 1645    PT Time Calculation (min) 30 min    Activity Tolerance Patient tolerated treatment well    Behavior During Therapy WFL for tasks assessed/performed                Past Medical History:  Diagnosis Date   Cataract    Diabetes mellitus    Ejection fraction    .   Hepatitis C    Hypertension    Hypertriglyceridemia    Pneumonia    Takotsubo syndrome    October 16, 2012   Past Surgical History:  Procedure Laterality Date   CHOLECYSTECTOMY     COLONOSCOPY     EYE SURGERY  02/2012   bilateral cataract surg   LEFT HEART CATHETERIZATION WITH CORONARY ANGIOGRAM N/A 10/17/2012   Procedure: LEFT HEART CATHETERIZATION WITH CORONARY ANGIOGRAM;  Surgeon: Tonny Bollman, MD;  Location: [facility] CATH LAB;  Service: Cardiovascular;  Laterality: N/A;   ROBOTIC ASSISTED TOTAL HYSTERECTOMY WITH BILATERAL SALPINGO OOPHERECTOMY Bilateral 01/02/2021   Procedure: XI ROBOTIC ASSISTED TOTAL HYSTERECTOMY WITH BILATERAL SALPINGO OOPHORECTOMY AND PELVIC WASHINGS;  Surgeon: Adolphus Birchwood, MD;  Location: [facility];  Service: Gynecology;  Laterality: Bilateral;   Patient Active Problem List   Diagnosis Date Noted   Pelvic mass 12/24/2020   Urinary retention 12/18/2020   Low back pain  12/05/2020   Estrogen deficiency 12/05/2020   Plantar callus 06/27/2020   Trigger finger, acquired 11/18/2016   Hearing loss secondary to cerumen impaction, left 11/18/2016   Hepatic cirrhosis (HCC) 05/23/2014   Mitral regurgitation 10/18/2012   Takotsubo syndrome 10/18/2012   HYPERTRIGLYCERIDEMIA 01/25/2009   INSOMNIA, PERSISTENT 01/25/2009   Type 2 diabetes mellitus with peripheral neuropathy (HCC) 04/13/2008   History of colonic polyps 05/02/2007   Chronic hepatitis C without hepatic coma (HCC) 03/07/2007   HYPERTENSION, BENIGN ESSENTIAL 01/28/2007    PCP: Caro Laroche, DO   REFERRING PROVIDER: Caro Laroche, DO   REFERRING DIAG:  R25.2 (ICD-10-CM) - Leg cramps R53.1 (ICD-10-CM) - Weakness  THERAPY DIAG:  Muscle weakness (generalized)  Unsteadiness on feet  Rationale for Evaluation and Treatment: Rehabilitation  ONSET DATE: Chronic  SUBJECTIVE:   SUBJECTIVE STATEMENT: Pt presents to PT with no current reports of pain. Reports no new falls since last session. Has not been as compliant with HEP as she would like.   PERTINENT HISTORY: DMII, HTN  PAIN:  Are you having pain?  No: NPRS scale: 0/10  PRECAUTIONS: Fall  WEIGHT BEARING RESTRICTIONS: No  FALLS:  Has patient fallen in last 6 months? Yes. 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  PLOF: Independent  PATIENT GOALS: improve strength and balance, decrease number of falls  OBJECTIVE:   DIAGNOSTIC FINDINGS:   N/A  PATIENT SURVEYS:  FOTO: 59% function; 63% predcited  COGNITION: Overall cognitive status: Within functional limits for tasks assessed     SENSATION: Light touch: Impaired  - bilateral feet (peripheral neruopathy)    POSTURE: rounded  shoulders and forward head  PALPATION: N/A/  LOWER EXTREMITY MMT:  MMT Right eval Left eval Right 03/17/23 Left 03/17/23  Hip flexion 3+/5 3/5 4/5 4/5  Hip extension      Hip abduction 3+/5 3/5 4/5 4/5  Hip adduction      Hip internal rotation      Hip external rotation      Knee flexion 5/5 5/5    Knee extension 5/5 5/5    Ankle dorsiflexion      Ankle plantarflexion      Ankle inversion      Ankle eversion       (Blank rows = not tested)  LOWER EXTREMITY SPECIAL TESTS:  Hip special tests: Hip scouring test: positive - left  FUNCTIONAL TESTS:  30 Second Sit to Stand: 14 reps -  Tandem stance: 15 seconds each  GAIT: Distance walked: 29ft Assistive device utilized: None Level of assistance: Complete Independence Comments: able to change spee and turn quickly with only slight LoB  TREATMENT: [facility] Adult PT Treatment:                                                DATE: 03/17/23 Therapeutic Exercise: Nustep level 5 x 5 mins while gathering subjective info Supine SLR 2x10 each Bridge with blue band 2x10 Supine clamshell 2x15 blue band S/L hip abd 2x10 Tandem stance 2x30" each Therapeutic Activity: Assessment of tests/measures, goals, and outcomes for discharge   [facility] Adult PT Treatment:                                                DATE: 02/03/23 Therapeutic Exercise: Lateral walk YTB x 2 laps in // Standing hip abduction/extension 2x10 BIL YTB Standing marching single UE support 2x30" Heel raises x 15 Toe raises x 15 Step up 2x10 8in 1 UE STS 2x10 - low table no UE Bridge 2x10  Supine SLR 2x10 2# S/L hip abd x 15 each Neuromuscular re-ed: Tandem on foam x 30" each FT EO on foam x 30" Tandem walking fwd 1 UE support  x 2 laps Hurdle step overs fwd/lat step to and step over step x4 laps each   [facility] Adult PT Treatment:                                                DATE: 01/27/23 Therapeutic Exercise: Nustep level 5 x 5 mins Standing hip  abduction/extension 2x10 BIL Standing marching single UE support 2x30" Heel raises 2x10 Toe raises 2x10 Neuromuscular re-ed: Step ups onto Airex lat x10 BIL Step up from Airex to 6" step fwd Tandem  walking fwd 1 UE support  Hurdle step overs fwd/lat step to and step over step x4 laps each  Hurdle step overs with airex in the middle - step to pattern fwd x2 laps     PATIENT EDUCATION:  Education details: HEP and discharge Person educated: Patient Education method: Explanation, Demonstration, and Handouts Education comprehension: verbalized understanding and returned demonstration  HOME EXERCISE PROGRAM: Access Code: [email or web address] URL: [email or web address] Date: 03/17/2023 Prepared by: Edwinna Areola  Exercises - Active Straight Leg Raise with Quad Set  - 3-4 x weekly - 2-3 sets - 10 reps - Supine Bridge  - 3-4 x weekly - 3 sets - 10 reps - Sidelying Hip Abduction  - 3-4 x weekly - 2-3 sets - 10 reps - Hooklying Clamshell with Resistance  - 1 x daily - 7 x weekly - 3 sets - 15 reps - blue band hold - Standing Tandem Balance with Counter Support  - 3-4 x weekly - 2-3 reps - 30 sec hold  ASSESSMENT:  CLINICAL IMPRESSION: Pt was able to complete prescribed exercises and demonstrated knowledge of HEP with no adverse effect. Over the course of PT treatment she has not progressed towards meeting all LTGs, with continued subjective decrease in function and balance. Did note improved strength and functional mobility and she should be able to maintain with HEP compliance, which has been difficult for her to this point. Pt in agreement with current plan and ready to discharge at this time.    OBJECTIVE IMPAIRMENTS: Abnormal gait, decreased activity tolerance, decreased mobility, difficulty walking, and decreased strength.   ACTIVITY LIMITATIONS: carrying, lifting, squatting, stairs, and transfers  PARTICIPATION LIMITATIONS: shopping, community activity, occupation, and yard  work  PERSONAL FACTORS: Time since onset of injury/illness/exacerbation and 1-2 comorbidities: DMII, HTN  are also affecting patient's functional outcome.    GOALS: Goals reviewed with patient? No  SHORT TERM GOALS: Target date: 01/25/2023   Pt will be compliant and knowledgeable with initial HEP for improved comfort and carryover Baseline: initial HEP given  Goal status: MET  LONG TERM GOALS: Target date: 03/17/2023   Pt will improve FOTO function score to no less than 63% as proxy for functional improvement Baseline: 59% function 02/17/2023: 50% function [id number]: 50% function Goal status: NOT MET   2.  Pt will improve LE MMT to no less than 4/5 for all tested motions for improved transfer ability and balance Baseline: see MMT chart Goal status: MET  3.  Pt will increase 30 Second Sit to Stand rep count to no less than 15 reps for improved balance, strength, and functional mobility Baseline: 13 reps  03/17/2023: 14 reps Goal status: PARTIALLY MET   4.  Pt will be able to hold tandem stance for at least 30 seconds for improved narrow BoS balance and function Baseline: 3 seconds Goal status: NOT MET  PLAN:  PT FREQUENCY: every other week  PT DURATION: 6 weeks  PLANNED INTERVENTIONS: Therapeutic exercises, Therapeutic activity, Neuromuscular re-education, Balance training, Gait training, Patient/Family education, Self Care, Joint mobilization, Dry Needling, Electrical stimulation, Cryotherapy, Moist heat, Vasopneumatic device, Manual therapy, and Re-evaluation  PLAN FOR NEXT SESSION: assess HEP response, hip strengthening, static balance, obstacle navigation   Eloy End, PT 03/18/2023, 9:30 AM

## 2023-04-23 ENCOUNTER — Other Ambulatory Visit: Payer: Self-pay

## 2023-04-23 DIAGNOSIS — E781 Pure hyperglyceridemia: Secondary | ICD-10-CM

## 2023-04-24 MED ORDER — METOPROLOL SUCCINATE ER 50 MG PO TB24: 50.0000 mg | ORAL_TABLET | Freq: Every evening | ORAL | 3 refills | Status: DC

## 2023-05-18 ENCOUNTER — Ambulatory Visit (INDEPENDENT_AMBULATORY_CARE_PROVIDER_SITE_OTHER): Payer: Medicare HMO | Admitting: Podiatry

## 2023-05-18 ENCOUNTER — Encounter: Payer: Self-pay | Admitting: Podiatry

## 2023-05-18 DIAGNOSIS — L84 Corns and callosities: Secondary | ICD-10-CM | POA: Diagnosis not present

## 2023-05-18 DIAGNOSIS — M79674 Pain in right toe(s): Secondary | ICD-10-CM | POA: Diagnosis not present

## 2023-05-18 DIAGNOSIS — E1142 Type 2 diabetes mellitus with diabetic polyneuropathy: Secondary | ICD-10-CM | POA: Diagnosis not present

## 2023-05-18 DIAGNOSIS — M79675 Pain in left toe(s): Secondary | ICD-10-CM | POA: Diagnosis not present

## 2023-05-18 DIAGNOSIS — B351 Tinea unguium: Secondary | ICD-10-CM

## 2023-05-18 NOTE — Progress Notes (Signed)
  Subjective:  Patient ID: Becky Fields, female    DOB: Feb 14, 1946,  MRN: [id number]  Chief Complaint  Patient presents with   Debridement    Trim toenails/calluses - diabetic - 6.2    76 y.o. female presents with the above complaint. History confirmed with patient.  Callus on toe on right foot has built up again, she also had some skin issues on the inside of the great toenail on the right side and try to clip this back.  The nails are thickened elongated causing pain and discomfort.  Objective:  Physical Exam: warm, good capillary refill, no trophic changes or ulcerative lesions, normal DP and PT pulses and normal sensory exam.    Bilateral submetatarsal 1 callus Left foot: Medial pinch callus of the hallux, thickened elongated discolored dystrophic nails with subungual debris Right Foot: Plantar IPJ callus hallux, contracted hammertoe digit of third toe with preulcerative callus distal tip that is painful.  Toe is semireducible, thickened elongated discolored dystrophic nails with subungual debris.  Hallux valgus deformity.  Right hallux previous ingrown healing well no infection noted no pain  Assessment:   1. Callus of foot   2. Pain due to onychomycosis of toenails of both feet   3. Type 2 diabetes mellitus with peripheral neuropathy (HCC)           Plan:  Patient was evaluated and treated and all questions answered.  All symptomatic hyperkeratoses were safely debrided with a sterile #15 blade to patient's level of comfort without incident.  Continue using urea cream  Discussed the etiology and treatment options for the condition in detail with the patient.  Regular debridement of the nails has helped. Sharp and mechanical debridement performed of all painful and mycotic nails today. Nails debrided in length and thickness using a nail nipper to level of comfort. Discussed treatment options including appropriate shoe gear. Follow up as needed for painful nails.  She is  interested in obtaining diabetic shoes again next year and she will be scheduled for this when the schedule opens.  Return in about 3 months (around 08/18/2023) for at risk diabetic foot care.

## 2023-07-12 ENCOUNTER — Ambulatory Visit (INDEPENDENT_AMBULATORY_CARE_PROVIDER_SITE_OTHER): Payer: Medicare HMO | Admitting: Family Medicine

## 2023-07-12 ENCOUNTER — Encounter: Payer: Self-pay | Admitting: Family Medicine

## 2023-07-12 VITALS — BP 136/71 | HR 68 | Ht 62.0 in | Wt 146.0 lb

## 2023-07-12 DIAGNOSIS — E1142 Type 2 diabetes mellitus with diabetic polyneuropathy: Secondary | ICD-10-CM | POA: Diagnosis not present

## 2023-07-12 DIAGNOSIS — R252 Cramp and spasm: Secondary | ICD-10-CM | POA: Diagnosis not present

## 2023-07-12 DIAGNOSIS — I10 Essential (primary) hypertension: Secondary | ICD-10-CM

## 2023-07-12 DIAGNOSIS — K7460 Unspecified cirrhosis of liver: Secondary | ICD-10-CM

## 2023-07-12 DIAGNOSIS — G8929 Other chronic pain: Secondary | ICD-10-CM

## 2023-07-12 DIAGNOSIS — E781 Pure hyperglyceridemia: Secondary | ICD-10-CM

## 2023-07-12 DIAGNOSIS — M5441 Lumbago with sciatica, right side: Secondary | ICD-10-CM | POA: Diagnosis not present

## 2023-07-12 LAB — POCT GLYCOSYLATED HEMOGLOBIN (HGB A1C): HbA1c, POC (controlled diabetic range): 6.2 % (ref 0.0–7.0)

## 2023-07-12 MED ORDER — BENZONATATE 100 MG PO CAPS: 100.0000 mg | ORAL_CAPSULE | Freq: Two times a day (BID) | ORAL | 0 refills | Status: AC | PRN

## 2023-07-12 NOTE — Assessment & Plan Note (Signed)
Doing well on current regimen, no changes made today. 

## 2023-07-12 NOTE — Patient Instructions (Addendum)
It was great to see you!  Our plans for today:  - STOP your metformin. We will recheck your A1c in about 6 months.   We are checking some labs today, we will release these results to your MyChart.  Take care and seek immediate care sooner if you develop any concerns.   Dr. Linwood Dibbles

## 2023-07-12 NOTE — Progress Notes (Signed)
   SUBJECTIVE:   CHIEF COMPLAINT / HPI:   Hypertension, h/o Takotsubo syndrome with MVR: - Medications: losartan, metoprolol - Compliance: good - Checking BP at home: infrequently. - Denies any SOB, CP, vision changes, LE edema, medication SEs, or symptoms of hypotension  HLD - medications: pravastatin - compliance: good - medication SEs: none  Diabetes, Type 2 - Last A1c 6.2 - Medications: metformin - Compliance: good - Checking BG at home: no - Eye exam: UTD, Dr. Blima Ledger. Will get records. - Foot exam: UTD - Microalbumin: due - Statin: yes - Denies symptoms of hypoglycemia, polyuria, polydipsia, numbness extremities, foot ulcers/trauma  Back pain - low back with h/o R sided sciatica. Works in [employer], on Health visitor a lot. Using DMSO cream and pain relief patches with some relief. Taking ibuprofen and tylenol with relief but unsure of how much she can take.   OBJECTIVE:   BP 136/71   Pulse 68   Ht 5\' 2"  (1.575 m)   Wt 146 lb (66.2 kg)   SpO2 98%   BMI 26.70 kg/m   Gen: well appearing, in NAD Card: RRR Lungs: CTAB Ext: WWP, no edema   ASSESSMENT/PLAN:   HYPERTENSION, BENIGN ESSENTIAL Doing well on current regimen, no changes made today.  Type 2 diabetes mellitus with peripheral neuropathy (HCC) Below goal. Discontinue metformin, recheck A1c in 6 months.  Low back pain Continue conservative measures. Limit tylenol to 1g daily given h/o cirrhosis.   Hepatic cirrhosis Limit tylenol use from back pain as below. Plan for RUQ [place] for HCC screening at follow up.   HYPERTRIGLYCERIDEMIA Tolerant of statin, continue.   F/u 6 mths.  Caro Laroche, DO

## 2023-07-12 NOTE — Assessment & Plan Note (Signed)
Continue conservative measures. Limit tylenol to 1g daily given h/o cirrhosis.

## 2023-07-12 NOTE — Assessment & Plan Note (Signed)
Below goal. Discontinue metformin, recheck A1c in 6 months.

## 2023-07-12 NOTE — Assessment & Plan Note (Signed)
Limit tylenol use from back pain as below. Plan for RUQ [place] for HCC screening at follow up.

## 2023-07-12 NOTE — Assessment & Plan Note (Signed)
Tolerant of statin, continue. 

## 2023-07-13 ENCOUNTER — Encounter: Payer: Self-pay | Admitting: Family Medicine

## 2023-07-13 LAB — COMPREHENSIVE METABOLIC PANEL
ALT: 15 [IU]/L (ref 0–32)
AST: 17 [IU]/L (ref 0–40)
Albumin: 4.4 g/dL (ref 3.8–4.8)
Alkaline Phosphatase: 74 [IU]/L (ref 44–121)
BUN/Creatinine Ratio: 28 (ref 12–28)
BUN: 22 mg/dL (ref 8–27)
Bilirubin Total: 1 mg/dL (ref 0.0–1.2)
CO2: 24 mmol/L (ref 20–29)
Calcium: 9.5 mg/dL (ref 8.7–10.3)
Chloride: 101 mmol/L (ref 96–106)
Creatinine, Ser: 0.79 mg/dL (ref 0.57–1.00)
Globulin, Total: 2.9 g/dL (ref 1.5–4.5)
Glucose: 122 mg/dL — ABNORMAL HIGH (ref 70–99)
Potassium: 4 mmol/L (ref 3.5–5.2)
Sodium: 138 mmol/L (ref 134–144)
Total Protein: 7.3 g/dL (ref 6.0–8.5)
eGFR: 77 mL/min/{1.73_m2} (ref >59)

## 2023-07-13 LAB — VITAMIN B12: Vitamin B-12: 439 pg/mL (ref 232–1245)

## 2023-07-13 LAB — MICROALBUMIN / CREATININE URINE RATIO
Creatinine, Urine: 71.7 mg/dL
Microalb/Creat Ratio: 9 mg/g{creat} (ref 0–29)
Microalbumin, Urine: 6.1 ug/mL

## 2023-07-13 LAB — MAGNESIUM: Magnesium: 1.8 mg/dL (ref 1.6–2.3)

## 2023-07-20 ENCOUNTER — Ambulatory Visit
Admission: EM | Admit: 2023-07-20 | Discharge: 2023-07-20 | Disposition: A | Discharge status: Home / Self Care | Payer: Medicare HMO | Attending: Family Medicine | Admitting: Family Medicine

## 2023-07-20 DIAGNOSIS — W5501XA Bitten by cat, initial encounter: Secondary | ICD-10-CM

## 2023-07-20 DIAGNOSIS — L089 Local infection of the skin and subcutaneous tissue, unspecified: Secondary | ICD-10-CM

## 2023-07-20 DIAGNOSIS — M79605 Pain in left leg: Secondary | ICD-10-CM

## 2023-07-20 DIAGNOSIS — Z23 Encounter for immunization: Secondary | ICD-10-CM | POA: Diagnosis not present

## 2023-07-20 DIAGNOSIS — S81852A Open bite, left lower leg, initial encounter: Secondary | ICD-10-CM

## 2023-07-20 MED ORDER — TETANUS-DIPHTH-ACELL PERTUSSIS 5-2.5-18.5 LF-MCG/0.5 IM SUSY
0.5000 mL | PREFILLED_SYRINGE | Freq: Once | INTRAMUSCULAR | Status: AC
  Administered 2023-07-20: 0.5 mL via INTRAMUSCULAR

## 2023-07-20 MED ORDER — AMOXICILLIN-POT CLAVULANATE 875-125 MG PO TABS: 1.0000 | ORAL_TABLET | Freq: Two times a day (BID) | ORAL | 0 refills | Status: DC

## 2023-07-20 MED ORDER — BACITRACIN ZINC 500 UNIT/GM EX OINT: 1.0000 | TOPICAL_OINTMENT | Freq: Two times a day (BID) | CUTANEOUS | 0 refills | Status: DC

## 2023-07-20 NOTE — ED Triage Notes (Signed)
Pt states her cat grabbed/scratched her left lower leg 2 days ago-feels area is infected-NAD-steady gait

## 2023-07-20 NOTE — Discharge Instructions (Signed)
Start Augmentin for the infection. Change your dressing 2-3 times daily. Every time you change your dressing, clean the wound gently with warm water and Dial antibacterial soap. Pat the wound dry, let it breathe for roughly an hour before covering it back up. When you reapply a dressing, apply Bacitracin ointment to the wound, then cover with non-stick/non-adherent gauze. If no improvement and definitely if worsening in 48 hours, go to the ER.

## 2023-07-20 NOTE — ED Provider Notes (Signed)
[facility] - URGENT [facility]ARE C[facility]  Note:  This document was prepared using Conservation officer, historic buildings and may include unintentional dictation errors.  MRN: [id number] DOB: May 05, 1946  Subjective:   Becky Fields is a 78 y.o. female presenting for 2 day history of persistent and worsening left lower leg pain with redness and drainage.  Patient accidentally stepped on her cat's tail and subsequently the cat scratched and bit her left lower leg.  Has been tending to the wound consistently.  Unfortunately she is worsening.  No current facility-administered medications for this encounter.  Current Outpatient Medications:    benzonatate (TESSALON) 100 MG capsule, Take 1 capsule (100 mg total) by mouth 2 (two) times daily as needed for cough., Disp: 20 capsule, Rfl: 0   losartan (COZAAR) 100 MG tablet, Take 1 tablet (100 mg total) by mouth daily., Disp: 90 tablet, Rfl: 3   metoprolol succinate (TOPROL-XL) 50 MG 24 hr tablet, Take 1 tablet (50 mg total) by mouth every evening., Disp: 90 tablet, Rfl: 3   pravastatin (PRAVACHOL) 40 MG tablet, Take 1 tablet by mouth once daily, Disp: 90 tablet, Rfl: 3   Allergies  Allergen Reactions   Lisinopril     REACTION: causes cough   Sulfa Antibiotics     Told as a child allergic    Past Medical History:  Diagnosis Date   Cataract    Diabetes mellitus    Ejection fraction    .   Hepatitis C    Hypertension    Hypertriglyceridemia    Pneumonia    Takotsubo syndrome    October 16, 2012     Past Surgical History:  Procedure Laterality Date   CHOLECYSTECTOMY     COLONOSCOPY     EYE SURGERY  02/2012   bilateral cataract surg   LEFT HEART CATHETERIZATION WITH CORONARY ANGIOGRAM N/A 10/17/2012   Procedure: LEFT HEART CATHETERIZATION WITH CORONARY ANGIOGRAM;  Surgeon: Tonny Bollman, MD;  Location: [facility] CATH LAB;  Service: Cardiovascular;  Laterality: N/A;   ROBOTIC ASSISTED TOTAL HYSTERECTOMY WITH BILATERAL SALPINGO OOPHERECTOMY Bilateral  01/02/2021   Procedure: XI ROBOTIC ASSISTED TOTAL HYSTERECTOMY WITH BILATERAL SALPINGO OOPHORECTOMY AND PELVIC WASHINGS;  Surgeon: Adolphus Birchwood, MD;  Location: [facility] ORS;  Service: Gynecology;  Laterality: Bilateral;    Family History  Problem Relation Age of Onset   Hypertension Mother    Aneurysm Father    Hypertension Sister    CAD Brother 46   Diabetes Brother    Heart attack Neg Hx    Stroke Neg Hx    Colon cancer Neg Hx    Breast cancer Neg Hx    Ovarian cancer Neg Hx    Endometrial cancer Neg Hx    Pancreatic cancer Neg Hx    Prostate cancer Neg Hx     Social History   Tobacco Use   Smoking status: Never    Passive exposure: Never   Smokeless tobacco: Never  Vaping Use   Vaping status: Never Used  Substance Use Topics   Alcohol use: Not Currently    Comment: weekly   Drug use: No    ROS   Objective:   Vitals: BP (!) 154/84 (BP Location: Right Arm)   Pulse 70   Temp 98 F (36.7 C) (Oral)   Resp 20   SpO2 97%   Physical Exam Constitutional:      General: She is not in acute distress.    Appearance: Normal appearance. She is well-developed. She is not ill-appearing, toxic-appearing  or diaphoretic.  HENT:     Head: Normocephalic and atraumatic.     Nose: Nose normal.     Mouth/Throat:     Mouth: Mucous membranes are moist.  Eyes:     General: No scleral icterus.       Right eye: No discharge.        Left eye: No discharge.     Extraocular Movements: Extraocular movements intact.  Cardiovascular:     Rate and Rhythm: Normal rate.  Pulmonary:     Effort: Pulmonary effort is normal.  Skin:    General: Skin is warm and dry.     Comments: Multiple puncture wounds over the dorsal medial aspect of the left lower leg with surrounding erythema, tenderness and drainage.  Neurological:     General: No focal deficit present.     Mental Status: She is alert and oriented to person, place, and time.  Psychiatric:        Mood and Affect: Mood normal.         Behavior: Behavior normal.       Tdap updated in clinic.  Dressing applied.  Assessment and Plan :   PDMP not reviewed this encounter.  1. Left leg pain   2. Cat bite of left lower leg with infection, initial encounter   3. Need for diphtheria-tetanus-pertussis (Tdap) vaccine    Creatinine clearance at 63 mL/min.  Start Augmentin, wound care reviewed.  Counseled patient on potential for adverse effects with medications prescribed/recommended today, ER and return-to-clinic precautions discussed, patient verbalized understanding.    Wallis Bamberg, [place] 07/20/23 1539

## 2023-08-05 ENCOUNTER — Ambulatory Visit: Payer: Medicare HMO

## 2023-08-05 DIAGNOSIS — M2141 Flat foot [pes planus] (acquired), right foot: Secondary | ICD-10-CM

## 2023-08-05 DIAGNOSIS — E1142 Type 2 diabetes mellitus with diabetic polyneuropathy: Secondary | ICD-10-CM

## 2023-08-05 DIAGNOSIS — M2041 Other hammer toe(s) (acquired), right foot: Secondary | ICD-10-CM

## 2023-08-05 DIAGNOSIS — L84 Corns and callosities: Secondary | ICD-10-CM

## 2023-08-05 NOTE — Progress Notes (Signed)
Patient presents to the office today for diabetic shoe and insole measuring.  Patient was measured with brannock device to determine size and width for 1 pair of extra depth shoes and foam casted for 3 pair of insoles.   Documentation of medical necessity will be sent to patient's treating diabetic doctor to verify and sign.   Patient's diabetic provider: Ellwood Dense DO   Shoes and insoles will be ordered at that time and patient will be notified for an appointment for fitting when they arrive.   Shoe size (per patient): 8.5 Brannock measurement: 7.5 Shoe choice:   [id number] / [id number] Shoe size ordered: 8.5W  Ppw / ABN signed

## 2023-08-06 ENCOUNTER — Ambulatory Visit: Payer: Medicare HMO | Admitting: Student

## 2023-08-06 VITALS — BP 124/62 | HR 83 | Ht 62.0 in | Wt 147.2 lb

## 2023-08-06 DIAGNOSIS — W5501XD Bitten by cat, subsequent encounter: Secondary | ICD-10-CM | POA: Diagnosis not present

## 2023-08-06 DIAGNOSIS — L244 Irritant contact dermatitis due to drugs in contact with skin: Secondary | ICD-10-CM

## 2023-08-06 NOTE — Progress Notes (Signed)
    SUBJECTIVE:   CHIEF COMPLAINT / HPI:   Cat Bite Cat bite to LLE on 1/26. Treated with Augmentin and Tdap in the ED on 1/28. Was doing well on Augmentin, finished her course and then transitioned to topical bacitracin. Since making this transition has noticed some erythema around the wound without tenderness or warmth.   PERTINENT  PMH / PSH: Cirrhosis 2/2 hx of Hep C that has been treated; T2DM (well controlled) A1c 6.2%   OBJECTIVE:   BP 124/62   Pulse 83   Ht 5\' 2"  (1.575 m)   Wt 147 lb 3.2 oz (66.8 kg)   SpO2 99%   BMI 26.92 kg/m   Gen: Well appearing and NAD  Resp: Normal WOB on RA, speaking in full sentences Ext: Scabbed over wound to LLE with surrounding erythema without warmth, tenderness, swelling, or streaking. No palpable popliteal or inguinal lymph nodes.    ASSESSMENT/PLAN:   Assessment & Plan Cat bite, subsequent encounter S/p treatment with augmentin. Wound now with erythema but without warmth, tenderness, or significant swelling/induration. ? If erythema may be 2/2 local irritation from the bacitracin? The wound is not obviously infected. - Hold off on further abx or debridement for now - STOP bacitracin - Okay to dress with vaseline + bandaid - Strict return precautions for spreading erythema, tenderness, warmth. She will call or send a MyChart and would send in antibiotics at that time.      Eliezer Mccoy, MD [facility]

## 2023-08-06 NOTE — Patient Instructions (Signed)
The lack of warmth or tenderness around the wound makes me think it is not infected and that the redness you are having may be a local reaction to the bacitracin. Let's stop that for now and just use vaseline.  If you notice it getting worse, warm, red, or spreading, please call us or send me a MyChart message and I'll send in some antibiotics.  Eliezer Mccoy, MD

## 2023-08-10 ENCOUNTER — Other Ambulatory Visit: Payer: Self-pay | Admitting: Student

## 2023-08-10 ENCOUNTER — Encounter: Payer: Self-pay | Admitting: Student

## 2023-08-10 MED ORDER — DOXYCYCLINE HYCLATE 100 MG PO TABS: 100.0000 mg | ORAL_TABLET | Freq: Two times a day (BID) | ORAL | 0 refills | Status: DC

## 2023-08-19 ENCOUNTER — Ambulatory Visit (INDEPENDENT_AMBULATORY_CARE_PROVIDER_SITE_OTHER): Payer: Medicare HMO | Admitting: Podiatry

## 2023-08-19 ENCOUNTER — Ambulatory Visit (INDEPENDENT_AMBULATORY_CARE_PROVIDER_SITE_OTHER): Payer: Medicare HMO | Admitting: Family Medicine

## 2023-08-19 ENCOUNTER — Encounter: Payer: Self-pay | Admitting: Family Medicine

## 2023-08-19 VITALS — BP 129/64 | HR 64 | Ht 62.0 in | Wt 146.2 lb

## 2023-08-19 DIAGNOSIS — B351 Tinea unguium: Secondary | ICD-10-CM

## 2023-08-19 DIAGNOSIS — M79674 Pain in right toe(s): Secondary | ICD-10-CM

## 2023-08-19 DIAGNOSIS — L84 Corns and callosities: Secondary | ICD-10-CM | POA: Diagnosis not present

## 2023-08-19 DIAGNOSIS — W5501XD Bitten by cat, subsequent encounter: Secondary | ICD-10-CM

## 2023-08-19 DIAGNOSIS — M79675 Pain in left toe(s): Secondary | ICD-10-CM

## 2023-08-19 DIAGNOSIS — E1142 Type 2 diabetes mellitus with diabetic polyneuropathy: Secondary | ICD-10-CM

## 2023-08-19 DIAGNOSIS — Z23 Encounter for immunization: Secondary | ICD-10-CM

## 2023-08-19 DIAGNOSIS — S81802D Unspecified open wound, left lower leg, subsequent encounter: Secondary | ICD-10-CM | POA: Diagnosis not present

## 2023-08-19 NOTE — Progress Notes (Signed)
  Date of Visit: 08/19/2023   SUBJECTIVE:   HPI:  Becky Fields presents today for a same day appointment to discuss cat bite.  At the end of January, sustained a cat bite to her leg.  Was seen 1/28 in urgent care.  Had updated Tdap and then was given prescription for Augmentin.  Seen here on 2/14, noted ongoing erythema around the wound but was not felt to be infected at the time.  She messaged a few days later and a course of doxycycline was sent in.  After completing the course, noticed that it still had not healed well.  She actually does think it looks little better today than it was several days ago.  Denies fevers or systemic symptoms.  She is otherwise feeling well.   PMHx: type 2 diabetes, hyperlipidemia, hypertension, takotsubo cardiomyopathy 2014, cirrhosis  OBJECTIVE:   BP (!) 136/93   Pulse 65   Ht 5\' 2"  (1.575 m)   Wt 146 lb 3.2 oz (66.3 kg)   SpO2 100%   BMI 26.74 kg/m  Gen: No acute distress, pleasant, cooperative, well-appearing HEENT: Normocephalic, atraumatic Lungs: Normal effort on room air Neuro: Grossly nonfocal, speech normal Ext: Chronic appearing wound on the medial aspect of the left lower leg.  No purulence.  No warmth.  No tenderness.  Surrounding erythema appears chronic. 2+ DP pulse on LLE.       ASSESSMENT/PLAN:   Assessment & Plan Cat bite, subsequent encounter Does not appear infected today, if anything actually appears improved from prior images.  I do not think further antibiotics are necessary at this time.  Still, this has been slow to heal.  Reviewed options for further evaluation.  Recommend she be seen by a wound specialist.  She actually saw her podiatry office earlier today and discovered they have a new wound care specialist there.  She would like to see them rather than be referred to the wound center, and plans to call and schedule an appointment.  If for some reason she cannot be seen there promptly, she will let us know and we will place  another referral.  Discussed return precautions and things to watch for.  She is comfortable with this plan. Encounter for immunization Flu and COVID vaccines given today    [place] J. Pollie Meyer, MD [facility]

## 2023-08-19 NOTE — Patient Instructions (Signed)
 It was great to see you again today.  Call podiatrist office to see if their wound specialist can see you If not, let me know and we'll get you referred to someone else  Watch for spreading redness, increased pus, fevers, increased pain, etc.  Be well, Dr. Pollie Meyer

## 2023-08-19 NOTE — Progress Notes (Signed)
  Subjective:  Patient ID: Becky Fields, female    DOB: Jul 28, 1945,  MRN: [id number]  Chief Complaint  Patient presents with   dfc    Pt stated she is here to get her nails trimmed     78 y.o. female presents with the above complaint. History confirmed with patient.  Callus on third toe on right foot has built up again, she has a new callus in the mid arch of the left foot and the callus has returned on the great toe on the right foot.  The nails are thickened elongated causing pain and discomfort.  She had a recent cat bite that is slow healing she is seeing her PCP for this today  Objective:  Physical Exam: warm, good capillary refill, no trophic changes or ulcerative lesions, normal DP and PT pulses and normal sensory exam.    Left foot: Porokeratosis medial arch, thickened elongated discolored dystrophic nails with subungual debris Right Foot: Plantar IPJ callus hallux, contracted hammertoe digit of third toe with preulcerative callus distal tip that is painful.  Toe is semireducible, thickened elongated discolored dystrophic nails with subungual debris.  Hallux valgus deformity.    Assessment:   1. Type 2 diabetes mellitus with peripheral neuropathy (HCC)   2. Callus of foot   3. Pain due to onychomycosis of toenails of both feet           Plan:  Patient was evaluated and treated and all questions answered.  All symptomatic hyperkeratoses were safely debrided with a sterile #15 blade to patient's level of comfort without incident.  Continue using urea cream.  Salinocaine ointment applied to left mid arch porokeratosis left foot  Discussed the etiology and treatment options for the condition in detail with the patient.  Regular debridement of the nails has helped. Sharp and mechanical debridement performed of all painful and mycotic nails today. Nails debrided in length and thickness using a nail nipper to level of comfort. Discussed treatment options including appropriate shoe  gear. Follow up as needed for painful nails.  She is seeing her PCP today for the cat bite, if they are not able to treat this continually then we will be happy to see her or possibly refer to wound care center  We will check on the status of her diabetic shoes   Return in about 3 months (around 11/16/2023) for at risk diabetic foot care.

## 2023-08-23 ENCOUNTER — Telehealth: Payer: Self-pay | Admitting: Family Medicine

## 2023-08-23 NOTE — Telephone Encounter (Signed)
 Reached out [employer] - Dr. Blima Ledger - to obtain last diabetic eye exam. LVM on medical records line for them to fax [place] most recent diabetic eye exam note.

## 2023-08-24 ENCOUNTER — Telehealth: Payer: Self-pay

## 2023-08-24 NOTE — Telephone Encounter (Signed)
 Spoke with Dr Madelaine Etienne old practice and patient and between the two figured out that Dr. No longer works at address listed in [employer] but is at a [facility] facility here on [address] Obtained new address, phone number and fax.  Corrected Dm shoe ppw and faxed to Dr.  Addison Bailey CPed, CFo, CFm

## 2023-08-26 ENCOUNTER — Ambulatory Visit: Payer: Medicare HMO | Admitting: Podiatry

## 2023-08-26 ENCOUNTER — Encounter: Payer: Self-pay | Admitting: Podiatry

## 2023-08-26 DIAGNOSIS — L97922 Non-pressure chronic ulcer of unspecified part of left lower leg with fat layer exposed: Secondary | ICD-10-CM

## 2023-08-26 MED ORDER — GENTAMICIN SULFATE 0.1 % EX OINT
1.0000 | TOPICAL_OINTMENT | Freq: Every day | CUTANEOUS | 0 refills | Status: DC
Start: 2023-08-26 — End: 2024-04-10

## 2023-08-26 NOTE — Progress Notes (Signed)
  Subjective:  Patient ID: Becky Fields, female    DOB: 1945/07/29,  MRN: [id number]  Chief Complaint  Patient presents with   Wound Check    "It's better but I just don't know how to treat it anymore.  I looked worse when I made the appointment but I think it's doing better now."    78 y.o. female presents with the above complaint. History confirmed with patient.  This developed in January after a cat bite she was on Augmentin for 2 weeks, has been bandaging it and leaving open to air at night  Objective:  Physical Exam: warm, good capillary refill, no trophic changes or ulcerative lesions, normal DP and PT pulses, normal sensory exam, and full-thickness ulcer left lower medial leg measuring 0.8 x 0.9 x 0.3 cm.  Fibrogranular wound bed appears beginning well without signs of infection.     Assessment:   1. 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  No signs of infection.  I recommended debridement today.  Full-thickness excisional debridement of the fibrosis and surrounding hyperkeratosis was completed with a sharp scalpel today.  Postdebridement measurements are noted above.  This was completed to the subcutaneous layer.  I prescribed her gentamicin ointment and recommended bandaging daily may leave open to air at night.  Return in 2 weeks to reevaluate likely will progress with Prisma collagen matrix applications after this.  Return in about 2 weeks (around 09/09/2023) for wound care L leg.

## 2023-09-08 ENCOUNTER — Telehealth: Payer: Self-pay

## 2023-09-08 NOTE — Telephone Encounter (Signed)
 Left VM to schedule diabetic shoe pick up

## 2023-09-08 NOTE — Telephone Encounter (Signed)
 Patient will be coming in to see Dr Lilian Kapur 3/20 at 4:15 I will see her at 4pm on 3/20  Dm shoe ppw work until 11/25/23

## 2023-09-09 ENCOUNTER — Ambulatory Visit: Admitting: Podiatry

## 2023-09-09 ENCOUNTER — Ambulatory Visit

## 2023-09-09 ENCOUNTER — Encounter: Payer: Self-pay | Admitting: Podiatry

## 2023-09-09 VITALS — Ht 62.0 in | Wt 146.2 lb

## 2023-09-09 DIAGNOSIS — M2041 Other hammer toe(s) (acquired), right foot: Secondary | ICD-10-CM

## 2023-09-09 DIAGNOSIS — E1142 Type 2 diabetes mellitus with diabetic polyneuropathy: Secondary | ICD-10-CM

## 2023-09-09 DIAGNOSIS — L97922 Non-pressure chronic ulcer of unspecified part of left lower leg with fat layer exposed: Secondary | ICD-10-CM

## 2023-09-09 DIAGNOSIS — L84 Corns and callosities: Secondary | ICD-10-CM

## 2023-09-09 DIAGNOSIS — M2141 Flat foot [pes planus] (acquired), right foot: Secondary | ICD-10-CM | POA: Diagnosis not present

## 2023-09-09 DIAGNOSIS — M2142 Flat foot [pes planus] (acquired), left foot: Secondary | ICD-10-CM

## 2023-09-10 NOTE — Progress Notes (Signed)

## 2023-09-12 NOTE — Progress Notes (Signed)
  Subjective:  Patient ID: Becky Fields, female    DOB: August 01, 1945,  MRN: [id number]  Chief Complaint  Patient presents with   Wound Check    Pt is here for wound care the left ankle/leg, she states she believes that it is healing well, no pain at all.    78 y.o. female presents with the above complaint. History confirmed with patient.  Doing better  Objective:  Physical Exam: warm, good capillary refill, no trophic changes or ulcerative lesions, normal DP and PT pulses, normal sensory exam, and full-thickness ulcer left lower medial leg measuring 0.6 x 0.6 x 0.3 cm.  Fully granular wound bed appears beginning well without signs of infection.     Assessment:   1. 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  Doing very well.  I debrided the fibrotic tissue away with a gauze gently.  There is a fully granular wound bed.  Wound size is reducing approximately one third.  Switch to Prisma collagen matrix.  Applied today in the office.  She will continue this at home every day.  Follow-up in 3 weeks to reevaluate.  Return in about 3 weeks (around 09/30/2023) for wound care.

## 2023-09-30 ENCOUNTER — Encounter: Payer: Self-pay | Admitting: Podiatry

## 2023-09-30 ENCOUNTER — Ambulatory Visit: Admitting: Podiatry

## 2023-09-30 DIAGNOSIS — L97922 Non-pressure chronic ulcer of unspecified part of left lower leg with fat layer exposed: Secondary | ICD-10-CM | POA: Diagnosis not present

## 2023-09-30 NOTE — Progress Notes (Signed)
  Subjective:  Patient ID: Becky Fields, female    DOB: 04-19-1946,  MRN: [id number]  Chief Complaint  Patient presents with   Follow-up    Patient states everything has been going fine since last visit no pain nor discomfort     78 y.o. female presents with the above complaint. History confirmed with patient.  Doing better  Objective:  Physical Exam: warm, good capillary refill, no trophic changes or ulcerative lesions, normal DP and PT pulses, normal sensory exam, and lower left leg ulcer has completely healed and epithelialized     Assessment:   1. 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  Doing well and is healed.  May leave open to air may want to bandage to keep skin protected for the next week while in pants for work.  Does not need to apply any ointment or Prisma any further.  Follow-up as needed for this return as scheduled for diabetic footcare  No follow-ups on file.

## 2023-10-18 ENCOUNTER — Other Ambulatory Visit

## 2023-10-20 ENCOUNTER — Encounter: Payer: Self-pay | Admitting: Family Medicine

## 2023-10-20 NOTE — Telephone Encounter (Signed)
 Patient returns call to nurse line.   Patient reports all of  the symptoms listed in the previous mychart message.   She reports she has not been eating or drinking. She reports "I feel like a drunken sailor."  Patient advised to go to the ED or UC for evaluation today.   She agreed with plan.

## 2023-10-22 ENCOUNTER — Encounter: Payer: Self-pay | Admitting: Family Medicine

## 2023-10-22 ENCOUNTER — Ambulatory Visit (INDEPENDENT_AMBULATORY_CARE_PROVIDER_SITE_OTHER): Admitting: Family Medicine

## 2023-10-22 VITALS — BP 131/71 | HR 89 | Ht 62.0 in | Wt 149.8 lb

## 2023-10-22 DIAGNOSIS — E1142 Type 2 diabetes mellitus with diabetic polyneuropathy: Secondary | ICD-10-CM | POA: Diagnosis not present

## 2023-10-22 DIAGNOSIS — I10 Essential (primary) hypertension: Secondary | ICD-10-CM

## 2023-10-22 DIAGNOSIS — G8929 Other chronic pain: Secondary | ICD-10-CM | POA: Diagnosis not present

## 2023-10-22 DIAGNOSIS — K7460 Unspecified cirrhosis of liver: Secondary | ICD-10-CM

## 2023-10-22 DIAGNOSIS — B182 Chronic viral hepatitis C: Secondary | ICD-10-CM

## 2023-10-22 DIAGNOSIS — M5441 Lumbago with sciatica, right side: Secondary | ICD-10-CM | POA: Diagnosis not present

## 2023-10-22 DIAGNOSIS — I5181 Takotsubo syndrome: Secondary | ICD-10-CM | POA: Diagnosis not present

## 2023-10-22 DIAGNOSIS — R252 Cramp and spasm: Secondary | ICD-10-CM | POA: Diagnosis not present

## 2023-10-22 DIAGNOSIS — R1900 Intra-abdominal and pelvic swelling, mass and lump, unspecified site: Secondary | ICD-10-CM

## 2023-10-22 LAB — POCT GLYCOSYLATED HEMOGLOBIN (HGB A1C): HbA1c, POC (controlled diabetic range): 6.3 % (ref 0.0–7.0)

## 2023-10-22 NOTE — Telephone Encounter (Signed)
 Patient returns call to nurse line.   She reports she missed a call.   Advised her coworker had sent a mychart on her behalf informing us  of her fever.   She reports she has been "fighting a bug" all week. She reports she felt "flushed" at work and took her temp, she reports 101. She reports she took some OTC medication and within the hour she feels much better.  She reports she forgot to mention to PCP this morning about her foot ulcer. She reports this is being followed by her Podiatrist.   She denies any worsening symptoms of the wound. No swelling, redness, pain or discharge.   She reports she will reach out to Podiatrist if she feels her wound is returning/worsening.   Patient advised to monitor all of her symptoms over the weekend.   ED precautions discussed.

## 2023-10-22 NOTE — Telephone Encounter (Signed)
 Called patient as requested in Mychart message.   She did not answer, LVM asking that patient return call to office.   Elsie Halo, RN

## 2023-10-22 NOTE — Patient Instructions (Addendum)
 It was great to see you!  Our plans for today:  - We are getting an ultrasound of your liver. Go to [facility] on 5/7 at 10:30am.  Don't eat or drink anything after midnight the night before. We will release these results to your MyChart. - When you notice your blood pressure is less than 100 for the top number, don't take your losartan  for that day. Make sure you are staying hydrated.  - If you are interested in osteopathic manipulative treatment, let us  know and we can schedule you an appointment for treatment. Try voltaren (diclofenac) gel for your pain. Try not to take ibuprofen /advil [place] every day.  We are checking some labs today, we will release these results to your MyChart.  Take care and seek immediate care sooner if you develop any concerns.   Dr. Tanish Sinkler

## 2023-10-22 NOTE — Assessment & Plan Note (Signed)
 Due for annual RUQ US , ordered.

## 2023-10-22 NOTE — Assessment & Plan Note (Signed)
 Some lower BP earlier this week when wasn't feeling well and likely dehydrated. BP at goal since at home and today in office, no changes. Encouraged adequate hydration. Will check labs.

## 2023-10-22 NOTE — Progress Notes (Signed)
   SUBJECTIVE:   CHIEF COMPLAINT / HPI:   Hypertension, h/o Takotsubo syndrome with MVR: - Medications: losartan , metoprolol  - Compliance: good - Checking BP at home: yes  Fatigue, dizzy - was sick earlier this week with lower BP she noticed SBP 80-90s. Symptoms include feeling foggy, fatigued. No nausea, cough, SOB. Some dizzy, lightheadedness. 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 Was dusty, mouse droppings. Felt bad the next morning and attributes her sickness to allergies. - does think she was probably dehydrated.  - BP since Monday has been normal, feeling better now  Back pain - low back with h/o R sided sciatica. Works in [employer], on Health visitor a lot. Feels it may be getting worse but unsure. Taking Tylenol , ibuprofen  scheduled every day. Not using voltaren as much. Did do PT for a while but found it difficult to do exercises given. Works and babysits every day.   OBJECTIVE:   BP 131/71   Pulse 89   Ht 5\' 2"  (1.575 m)   Wt 149 lb 12.8 oz (67.9 kg)   SpO2 99%   BMI 27.40 kg/m   Gen: well appearing, in NAD Card: RRR Lungs: CTAB Ext: WWP, no edema   ASSESSMENT/PLAN:   Chronic hepatitis C without hepatic coma (HCC) Due for annual RUQ US , ordered.  HYPERTENSION, BENIGN ESSENTIAL Some lower BP earlier this week when wasn't feeling well and likely dehydrated. BP at goal since at home and today in office, no changes. Encouraged adequate hydration. Will check labs.  Back pain of lumbar region with sciatica Chronic. Advised limiting tylenol , ibuprofen  use given was taking scheduled. Recommended voltaren. Offered OMT, handout provided. Will check labs today.   Type 2 diabetes mellitus with peripheral neuropathy (HCC) A1c 6.3. At goal off metformin . No changes.      Kandis Ormond, DO

## 2023-10-22 NOTE — Assessment & Plan Note (Addendum)
 Chronic. Advised limiting tylenol , ibuprofen  use given was taking scheduled. Recommended voltaren. Offered OMT, handout provided. Will check labs today.

## 2023-10-22 NOTE — Assessment & Plan Note (Signed)
 A1c 6.3. At goal off metformin . No changes.

## 2023-10-23 LAB — BASIC METABOLIC PANEL WITH GFR
BUN/Creatinine Ratio: 29 — ABNORMAL HIGH (ref 12–28)
BUN: 24 mg/dL (ref 8–27)
CO2: 19 mmol/L — ABNORMAL LOW (ref 20–29)
Calcium: 8.8 mg/dL (ref 8.7–10.3)
Chloride: 102 mmol/L (ref 96–106)
Creatinine, Ser: 0.83 mg/dL (ref 0.57–1.00)
Glucose: 137 mg/dL — ABNORMAL HIGH (ref 70–99)
Potassium: 4.6 mmol/L (ref 3.5–5.2)
Sodium: 137 mmol/L (ref 134–144)
eGFR: 73 mL/min/{1.73_m2} (ref >59)

## 2023-10-23 LAB — MAGNESIUM: Magnesium: 2 mg/dL (ref 1.6–2.3)

## 2023-10-24 ENCOUNTER — Emergency Department (HOSPITAL_BASED_OUTPATIENT_CLINIC_OR_DEPARTMENT_OTHER)

## 2023-10-24 ENCOUNTER — Encounter (HOSPITAL_BASED_OUTPATIENT_CLINIC_OR_DEPARTMENT_OTHER): Payer: Self-pay | Admitting: Emergency Medicine

## 2023-10-24 ENCOUNTER — Other Ambulatory Visit: Payer: Self-pay

## 2023-10-24 ENCOUNTER — Emergency Department (HOSPITAL_BASED_OUTPATIENT_CLINIC_OR_DEPARTMENT_OTHER)
Admission: EM | Admit: 2023-10-24 | Discharge: 2023-10-24 | Disposition: A | Discharge status: Home / Self Care | Attending: Emergency Medicine | Admitting: Emergency Medicine

## 2023-10-24 DIAGNOSIS — S022XXA Fracture of nasal bones, initial encounter for closed fracture: Secondary | ICD-10-CM | POA: Diagnosis not present

## 2023-10-24 DIAGNOSIS — N3001 Acute cystitis with hematuria: Secondary | ICD-10-CM | POA: Diagnosis not present

## 2023-10-24 DIAGNOSIS — R509 Fever, unspecified: Secondary | ICD-10-CM | POA: Insufficient documentation

## 2023-10-24 DIAGNOSIS — E1142 Type 2 diabetes mellitus with diabetic polyneuropathy: Secondary | ICD-10-CM | POA: Insufficient documentation

## 2023-10-24 DIAGNOSIS — S199XXA Unspecified injury of neck, initial encounter: Secondary | ICD-10-CM | POA: Diagnosis not present

## 2023-10-24 DIAGNOSIS — S0011XA Contusion of right eyelid and periocular area, initial encounter: Secondary | ICD-10-CM | POA: Diagnosis not present

## 2023-10-24 DIAGNOSIS — I10 Essential (primary) hypertension: Secondary | ICD-10-CM | POA: Diagnosis not present

## 2023-10-24 DIAGNOSIS — R220 Localized swelling, mass and lump, head: Secondary | ICD-10-CM | POA: Diagnosis not present

## 2023-10-24 DIAGNOSIS — M25561 Pain in right knee: Secondary | ICD-10-CM | POA: Insufficient documentation

## 2023-10-24 DIAGNOSIS — S0591XA Unspecified injury of right eye and orbit, initial encounter: Secondary | ICD-10-CM | POA: Diagnosis present

## 2023-10-24 DIAGNOSIS — S0990XA Unspecified injury of head, initial encounter: Secondary | ICD-10-CM | POA: Diagnosis not present

## 2023-10-24 DIAGNOSIS — W01198A Fall on same level from slipping, tripping and stumbling with subsequent striking against other object, initial encounter: Secondary | ICD-10-CM | POA: Insufficient documentation

## 2023-10-24 DIAGNOSIS — M5030 Other cervical disc degeneration, unspecified cervical region: Secondary | ICD-10-CM | POA: Diagnosis not present

## 2023-10-24 DIAGNOSIS — R9431 Abnormal electrocardiogram [ECG] [EKG]: Secondary | ICD-10-CM | POA: Diagnosis not present

## 2023-10-24 DIAGNOSIS — S0003XA Contusion of scalp, initial encounter: Secondary | ICD-10-CM | POA: Diagnosis not present

## 2023-10-24 DIAGNOSIS — Z79899 Other long term (current) drug therapy: Secondary | ICD-10-CM | POA: Insufficient documentation

## 2023-10-24 DIAGNOSIS — N390 Urinary tract infection, site not specified: Secondary | ICD-10-CM

## 2023-10-24 DIAGNOSIS — R319 Hematuria, unspecified: Secondary | ICD-10-CM | POA: Diagnosis not present

## 2023-10-24 DIAGNOSIS — W19XXXA Unspecified fall, initial encounter: Secondary | ICD-10-CM

## 2023-10-24 LAB — URINALYSIS, ROUTINE W REFLEX MICROSCOPIC
Bilirubin Urine: NEGATIVE
Glucose, UA: NEGATIVE mg/dL
Ketones, ur: NEGATIVE mg/dL
Nitrite: POSITIVE — AB
Protein, ur: 30 mg/dL — AB
Specific Gravity, Urine: 1.02 (ref 1.005–1.030)
pH: 5.5 (ref 5.0–8.0)

## 2023-10-24 LAB — COMPREHENSIVE METABOLIC PANEL WITH GFR
ALT: 14 U/L (ref 0–44)
AST: 16 U/L (ref 15–41)
Albumin: 3.7 g/dL (ref 3.5–5.0)
Alkaline Phosphatase: 96 U/L (ref 38–126)
Anion gap: 15 (ref 5–15)
BUN: 21 mg/dL (ref 8–23)
CO2: 19 mmol/L — ABNORMAL LOW (ref 22–32)
Calcium: 9.2 mg/dL (ref 8.9–10.3)
Chloride: 98 mmol/L (ref 98–111)
Creatinine, Ser: 0.7 mg/dL (ref 0.44–1.00)
GFR, Estimated: >60 mL/min (ref >60)
Glucose, Bld: 144 mg/dL — ABNORMAL HIGH (ref 70–99)
Potassium: 4.7 mmol/L (ref 3.5–5.1)
Sodium: 132 mmol/L — ABNORMAL LOW (ref 135–145)
Total Bilirubin: 0.6 mg/dL (ref 0.0–1.2)
Total Protein: 7.2 g/dL (ref 6.5–8.1)

## 2023-10-24 LAB — CBC WITH DIFFERENTIAL/PLATELET
Abs Immature Granulocytes: 0.05 10*3/uL (ref 0.00–0.07)
Basophils Absolute: 0 10*3/uL (ref 0.0–0.1)
Basophils Relative: 0 %
Eosinophils Absolute: 0.1 10*3/uL (ref 0.0–0.5)
Eosinophils Relative: 1 %
HCT: 32.8 % — ABNORMAL LOW (ref 36.0–46.0)
Hemoglobin: 11.5 g/dL — ABNORMAL LOW (ref 12.0–15.0)
Immature Granulocytes: 1 %
Lymphocytes Relative: 11 %
Lymphs Abs: 1.1 10*3/uL (ref 0.7–4.0)
MCH: 31 pg (ref 26.0–34.0)
MCHC: 35.1 g/dL (ref 30.0–36.0)
MCV: 88.4 fL (ref 80.0–100.0)
Monocytes Absolute: 1.3 10*3/uL — ABNORMAL HIGH (ref 0.1–1.0)
Monocytes Relative: 13 %
Neutro Abs: 7.2 10*3/uL (ref 1.7–7.7)
Neutrophils Relative %: 74 %
Platelets: 156 10*3/uL (ref 150–400)
RBC: 3.71 MIL/uL — ABNORMAL LOW (ref 3.87–5.11)
RDW: 11.6 % (ref 11.5–15.5)
Smear Review: NORMAL
WBC: 9.6 10*3/uL (ref 4.0–10.5)
nRBC: 0 % (ref 0.0–0.2)

## 2023-10-24 LAB — RESP PANEL BY RT-PCR (RSV, FLU A&B, COVID)  RVPGX2
Influenza A by PCR: NEGATIVE
Influenza B by PCR: NEGATIVE
Resp Syncytial Virus by PCR: NEGATIVE
SARS Coronavirus 2 by RT PCR: NEGATIVE

## 2023-10-24 LAB — URINALYSIS, MICROSCOPIC (REFLEX)

## 2023-10-24 MED ORDER — ACETAMINOPHEN 500 MG PO TABS
500.0000 mg | ORAL_TABLET | Freq: Once | ORAL | Status: AC
  Administered 2023-10-24: 500 mg via ORAL
  Filled 2023-10-24: qty 1

## 2023-10-24 MED ORDER — CEPHALEXIN 500 MG PO CAPS: 500.0000 mg | ORAL_CAPSULE | Freq: Two times a day (BID) | ORAL | 0 refills | Status: AC

## 2023-10-24 MED ORDER — LACTATED RINGERS IV BOLUS
1000.0000 mL | Freq: Once | INTRAVENOUS | Status: AC
  Administered 2023-10-24: 1000 mL via INTRAVENOUS

## 2023-10-24 MED ORDER — SODIUM CHLORIDE 0.9 % IV SOLN
1.0000 g | Freq: Once | INTRAVENOUS | Status: AC
  Administered 2023-10-24: 1 g via INTRAVENOUS
  Filled 2023-10-24: qty 10

## 2023-10-24 NOTE — Discharge Instructions (Signed)
 Thank for let us  evaluate you today.  Your x-rays and CTs were negative for fractures or bleeding.  You have a UTI.  We provided you with 1 dose of antibiotics through your IV here in the emergency department as well as IV fluids.  I have sent a prescription to your pharmacy for oral antibiotics.  Please make sure to take entire dose as prescribed and do not miss a dose.  Return to Emergency Department if you experience significant worsening of symptoms

## 2023-10-24 NOTE — ED Triage Notes (Signed)
 Pt fell Friday night, fell forward onto floor, hitting head; denies LOC, no blood thinners; pt also reports various sxs since Monday, including chills, fatigue, confusion at times; A & O x 4 at this time, NAD

## 2023-10-24 NOTE — ED Provider Notes (Signed)
 [facility] [facility] Provider Note   CSN: [id number] Arrival date & time: 10/24/23  1605     History {Add pertinent medical, surgical, social history, OB history to HPI:1} Chief Complaint  Patient presents with   Fall    Maely Potenza is a 78 y.o. female with past medical history of hepatitis C, T2DM, HTN, cholecystectomy, peripheral neuropathy presents to emergency department for evaluation of facial trauma and right knee pain following fall on Friday night 2 days prior.  She reports that she was cleaning and tripped on a vacuum cord falling forward and landing on the vinyl floor.  She also complains that she has been off balance since Monday with some word repetition and words wandering.  She reports that she is felt like "a drunken sailor on a sinking ship".  Was evaluated by PCP on 10/22/23 for malaise, SBP in 80s-90s, fatigued, dizziness who checked labwork and contributed symptoms to dehydration as she reported improvement.  She denies urinary symptoms, kidney stones, cough, congestion   Fall Pertinent negatives include no chest pain, no abdominal pain, no headaches and no shortness of breath.     Home Medications Prior to Admission medications   Medication Sig Start Date End Date Taking? Authorizing Provider  benzonatate  (TESSALON ) 100 MG capsule Take 1 capsule (100 mg total) by mouth 2 (two) times daily as needed for cough. 07/12/23   Rumball, Alison M, DO  gentamicin  ointment (GARAMYCIN ) 0.1 % Apply 1 Application topically daily. Apply to wound daily 08/26/23   McDonald, Olive Better, DPM  losartan  (COZAAR ) 100 MG tablet Take 1 tablet (100 mg total) by mouth daily. 02/11/23   Rumball, Alison M, DO  metoprolol  succinate (TOPROL -XL) 50 MG 24 hr tablet Take 1 tablet (50 mg total) by mouth every evening. 04/24/23   Rumball, Alison M, DO  pravastatin  (PRAVACHOL ) 40 MG tablet Take 1 tablet by mouth once daily 02/15/23   Rumball, Alison M, DO      Allergies     Lisinopril and Sulfa antibiotics    Review of Systems   Review of Systems  Constitutional:  Negative for chills, fatigue and fever.  Respiratory:  Negative for cough, chest tightness, shortness of breath and wheezing.   Cardiovascular:  Negative for chest pain and palpitations.  Gastrointestinal:  Negative for abdominal pain, constipation, diarrhea, nausea and vomiting.  Neurological:  Negative for dizziness, seizures, weakness, light-headedness, numbness and headaches.    Physical Exam Updated Vital Signs BP (!) 159/70 (BP Location: Right Arm)   Pulse 100   Temp (!) 100.5 F (38.1 C) (Oral)   Resp 18   Ht 5\' 2"  (1.575 m)   Wt 67.9 kg   SpO2 99%   BMI 27.38 kg/m  Physical Exam Vitals and nursing note reviewed.  Constitutional:      General: She is not in acute distress.    Appearance: Normal appearance. She is not ill-appearing or diaphoretic.  HENT:     Head: Normocephalic. No raccoon eyes or Battle's sign.     Comments: Ecchymosis to right medial orbit TTP of anterior nasal bone and right zygomatic bone No hematoma to cranium    Right Ear: External ear normal. No hemotympanum.     Left Ear: External ear normal. No hemotympanum.     Nose: Nose normal.     Right Nostril: No epistaxis or septal hematoma.     Left Nostril: No epistaxis or septal hematoma.     Mouth/Throat:  Mouth: Mucous membranes are moist. No injury or lacerations.  Eyes:     General:        Right eye: No discharge.        Left eye: No discharge.     Extraocular Movements: Extraocular movements intact.     Conjunctiva/sclera: Conjunctivae normal.     Pupils: Pupils are equal, round, and reactive to light.     Comments: No subconjunctival hemorrhage, hyphema, tear drop pupil, or fluid leakage bilaterally  Neck:     Vascular: No carotid bruit.  Cardiovascular:     Rate and Rhythm: Normal rate.     Pulses: Normal pulses.          Radial pulses are 2+ on the right side and 2+ on the left side.        Dorsalis pedis pulses are 2+ on the right side and 2+ on the left side.  Pulmonary:     Effort: Pulmonary effort is normal. No respiratory distress.     Breath sounds: Normal breath sounds. No wheezing.  Chest:     Chest wall: No tenderness.  Abdominal:     General: Bowel sounds are normal. There is no distension.     Palpations: Abdomen is soft.     Tenderness: There is no abdominal tenderness. There is no guarding or rebound.  Musculoskeletal:     Cervical back: Full passive range of motion without pain, normal range of motion and neck supple. No deformity, rigidity or bony tenderness. Normal range of motion.     Thoracic back: No deformity or bony tenderness. Normal range of motion.     Lumbar back: No deformity or bony tenderness. Normal range of motion.     Right hip: No bony tenderness or crepitus.     Left hip: No bony tenderness or crepitus.     Right lower leg: No edema.     Left lower leg: No edema.     Comments: No obvious deformity to joints or long bones Pelvis stable with no shortening or rotation of LE bilaterally  Skin:    General: Skin is warm and dry.     Capillary Refill: Capillary refill takes less than 2 seconds.  Neurological:     General: No focal deficit present.     Mental Status: She is alert and oriented to person, place, and time. Mental status is at baseline.     GCS: GCS eye subscore is 4. GCS verbal subscore is 5. GCS motor subscore is 6.     Cranial Nerves: Cranial nerves 2-12 are intact. No cranial nerve deficit.     Sensory: Sensation is intact. No sensory deficit.     Motor: Motor function is intact. No weakness or tremor.     Coordination: Coordination is intact. Coordination normal. Finger-Nose-Finger Test and Heel to [facility] Test normal.     Gait: Gait is intact. Gait normal.     Deep Tendon Reflexes: Reflexes are normal and symmetric. Reflexes normal.     Comments: following commands appropriately     ED Results / Procedures / Treatments    Labs (all labs ordered are listed, but only abnormal results are displayed) Labs Reviewed  CBC WITH DIFFERENTIAL/PLATELET - Abnormal; Notable for the following components:      Result Value   RBC 3.71 (*)    Hemoglobin 11.5 (*)    HCT 32.8 (*)    Monocytes Absolute 1.3 (*)    All other components within normal limits  COMPREHENSIVE  METABOLIC PANEL WITH GFR - Abnormal; Notable for the following components:   Sodium 132 (*)    CO2 19 (*)    Glucose, Bld 144 (*)    All other components within normal limits  URINALYSIS, ROUTINE W REFLEX MICROSCOPIC - Abnormal; Notable for the following components:   Hgb urine dipstick MODERATE (*)    Protein, ur 30 (*)    Nitrite POSITIVE (*)    Leukocytes,Ua SMALL (*)    All other components within normal limits  URINALYSIS, MICROSCOPIC (REFLEX) - Abnormal; Notable for the following components:   Bacteria, UA MANY (*)    All other components within normal limits  RESP PANEL BY RT-PCR (RSV, FLU A&B, COVID)  [id number]  URINE CULTURE    EKG None  Radiology CT Cervical Spine Wo Contrast Result Date: 10/24/2023 CLINICAL DATA:  Neck trauma (Age >= 65y).  Fall EXAM: CT CERVICAL SPINE WITHOUT CONTRAST TECHNIQUE: Multidetector CT imaging of the cervical spine was performed without intravenous contrast. Multiplanar CT image reconstructions were also generated. RADIATION DOSE REDUCTION: This exam was performed according to the departmental dose-optimization program which includes automated exposure control, adjustment of the mA and/or kV according to patient size and/or use of iterative reconstruction technique. COMPARISON:  None Available. FINDINGS: Alignment: Normal Skull base and vertebrae: No acute fracture. No primary bone lesion or focal pathologic process. Soft tissues and spinal canal: No prevertebral fluid or swelling. No visible canal hematoma. Disc levels: Moderate degenerative facet disease bilaterally, left greater than right. Disc spaces maintained.  Early anterior spurring. No disc herniation. Upper chest: No acute findings Other: None IMPRESSION: No acute bony abnormality. Electronically Signed   By: Janeece Mechanic M.D.   On: 10/24/2023 17:22   CT Head Wo Contrast Result Date: 10/24/2023 CLINICAL DATA:  Head trauma, minor (Age >= 65y).  Fall. EXAM: CT HEAD WITHOUT CONTRAST TECHNIQUE: Contiguous axial images were obtained from the base of the skull through the vertex without intravenous contrast. RADIATION DOSE REDUCTION: This exam was performed according to the departmental dose-optimization program which includes automated exposure control, adjustment of the mA and/or kV according to patient size and/or use of iterative reconstruction technique. COMPARISON:  None Available. FINDINGS: Brain: No acute infarct or hemorrhage. No hydrocephalus. 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 While this is likely benign, given the size, this warrants further evaluation with MRI. No mass effect or midline shift. Vascular: No hyperdense vessel or unexpected calcification. Skull: No acute calvarial abnormality. Sinuses/Orbits: No acute findings Other: Soft tissue swelling in the right forehead region. 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 While likely benign, the large size of this mass lesion warrants further evaluation with MRI with and without contrast. No acute traumatic changes or acute infarct. Electronically Signed   By: Janeece Mechanic M.D.   On: 10/24/2023 17:21    Procedures Procedures  {Document cardiac monitor, telemetry assessment procedure when appropriate:1}  Medications Ordered in ED Medications  cefTRIAXone (ROCEPHIN) 1 g in sodium chloride  0.9 % 100 mL IVPB (has no administration in time range)  acetaminophen  (TYLENOL ) tablet 500 mg (500 mg Oral Given 10/24/23 1716)    ED Course/ Medical Decision Making/ A&P Clinical Course as of 10/24/23 1944  Sun Oct 24, 2023  1850 Urine  Culture Collected prior to rocephin administration [LB]    Clinical Course User Index [LB] Royann Cords, PA   {   Click here for ABCD2, HEART and other  calculatorsREFRESH Note before signing :1}                              Medical Decision Making Amount and/or Complexity of Data Reviewed Labs: ordered. Decision-making details documented in ED Course. Radiology: ordered.  Risk OTC drugs.   Patient presents to the ED for concern of injury following fall, chills, fatigue, confusion, this involves an extensive number of treatment options, and is a complaint that carries with it a high risk of complications and morbidity.  The differential diagnosis includes UTI, pneumonia, electrolyte abnormality, ICH, SAH, CVA/TIA, ACS   Co morbidities that complicate the patient evaluation  See HPI   Additional history obtained:  Additional history obtained from [facility]   External records from outside source obtained and reviewed including triage RN note, family at bedside   Lab Tests:  I Ordered, and personally interpreted labs.  The pertinent results include:   Hgb 11.5 Sodium 132 CBG 144 UA with moderate Hgb, 30 protein, positive nitrates, small leuks, 6-10 RBC, WBC, many bacteria   Imaging Studies ordered:  I ordered imaging studies including CT head, CT cervical spine, CT maxillofacial, right knee x-ray I independently visualized and interpreted imaging which showed no acute traumatic injury nor intracranial abnormality I agree with the radiologist interpretation   Cardiac Monitoring:  The patient was maintained on a cardiac monitor.  I personally viewed and interpreted the cardiac monitored which showed an underlying rhythm of: ***   Medicines ordered and prescription drug management:  I ordered medication including ***  for ***  Reevaluation of the patient after these medicines showed that the patient {resolved/improved/worsened:23923::"improved"} I have  reviewed the patients home medicines and have made adjustments as needed   Test Considered:  ***   Critical Interventions:  ***   Consultations Obtained:  I requested consultation with neurology Dr. Cleone Dad,  and discussed lab and imaging findings as well as pertinent plan - they recommend:  No need for emergent MRI and can follow up outpatient for MRI with neurology for pineal mass   Problem List / ED Course:  *** Low suspionm for stone   Reevaluation:  After the interventions noted above, I reevaluated the patient and found that they have :{resolved/improved/worsened:23923::"improved"}   Social Determinants of Health:  ***   Dispostion:  After consideration of the diagnostic results and the patients response to treatment, I feel that the patent would benefit from ***.    {Document critical care time when appropriate:1} {Document review of labs and clinical decision tools ie heart score, Chads2Vasc2 etc:1}  {Document your independent review of radiology images, and any outside records:1} {Document your discussion with family members, caretakers, and with consultants:1} {Document social determinants of health affecting pt's care:1} {Document your decision making why or why not admission, treatments were needed:1} Final Clinical Impression(s) / ED Diagnoses Final diagnoses:  None    Rx / DC Orders ED Discharge Orders     None

## 2023-10-24 NOTE — Plan of Care (Signed)
 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 While likely benign, the large size of this mass lesion warrants further evaluation with MRI with and without contrast.   No acute traumatic changes or acute infarct.  Has been dizzy in the setting of fever, found to have a UTI, hypotensive,   Recommendations  - Pineal mass can be followed up outpatient, agree MRI brain w/ and w/o would be appropriate  - Management of dizziness per ED if felt to be in proportion to her medical issues  Baldwin Levee MD-PhD [facility] [phone]  Discussed with ED PA Paris Bolds  These are curbside recommendations based upon the information readily available in the chart on brief review as well as history and examination information provided to me by requesting provider and do not replace a full detailed consult

## 2023-10-25 ENCOUNTER — Other Ambulatory Visit: Payer: Self-pay | Admitting: Family Medicine

## 2023-10-25 DIAGNOSIS — G9389 Other specified disorders of brain: Secondary | ICD-10-CM

## 2023-10-26 ENCOUNTER — Encounter: Payer: Self-pay | Admitting: Family Medicine

## 2023-10-26 LAB — URINE CULTURE: Culture: >=100000 — AB

## 2023-10-27 ENCOUNTER — Telehealth (HOSPITAL_BASED_OUTPATIENT_CLINIC_OR_DEPARTMENT_OTHER): Payer: Self-pay

## 2023-10-27 ENCOUNTER — Ambulatory Visit (HOSPITAL_COMMUNITY)

## 2023-10-27 NOTE — Progress Notes (Signed)
 ED Antimicrobial Stewardship Positive Culture Follow Up   Becky Fields is an 78 y.o. female who presented to [facility] on 10/24/2023 with a chief complaint of  Chief Complaint  Patient presents with   Fall    Recent Results (from the past 720 hours)  Resp panel by RT-PCR (RSV, Flu A&B, Covid) Anterior Nasal Swab     Status: None   Collection Time: 10/24/23  4:17 PM   Specimen: Anterior Nasal Swab  Result Value Ref Range Status   SARS Coronavirus 2 by RT PCR NEGATIVE NEGATIVE Final    Comment: (NOTE) SARS-CoV-2 target nucleic acids are NOT DETECTED.  The SARS-CoV-2 RNA is generally detectable in upper respiratory specimens during the acute phase of infection. The lowest concentration of SARS-CoV-2 viral copies this assay can detect is 138 copies/mL. A negative result does not preclude SARS-Cov-2 infection and should not be used as the sole basis for treatment or other patient management decisions. A negative result may occur with  improper specimen collection/handling, submission of specimen other than nasopharyngeal swab, presence of viral mutation(s) within the areas targeted by this assay, and inadequate number of viral copies(<138 copies/mL). A negative result must be combined with clinical observations, patient history, and epidemiological information. The expected result is Negative.  Fact Sheet for Patients:  BloggerCourse.com  Fact Sheet for Healthcare Providers:  SeriousBroker.it  This test is no t yet approved or cleared by the [place]  FDA and  has been authorized for detection and/or diagnosis of SARS-CoV-2 by FDA under an Emergency Use Authorization (EUA). This EUA will remain  in effect (meaning this test can be used) for the duration of the COVID-19 declaration under Section 564(b)(1) of the Act, 21 U.S.C.section 360bbb-3(b)(1), unless the authorization is terminated  or revoked sooner.       Influenza A  by PCR NEGATIVE NEGATIVE Final   Influenza B by PCR NEGATIVE NEGATIVE Final    Comment: (NOTE) The Xpert Xpress SARS-CoV-2/FLU/RSV plus assay is intended as an aid in the diagnosis of influenza from Nasopharyngeal swab specimens and should not be used as a sole basis for treatment. Nasal washings and aspirates are unacceptable for Xpert Xpress SARS-CoV-2/FLU/RSV testing.  Fact Sheet for Patients: BloggerCourse.com  Fact Sheet for Healthcare Providers: SeriousBroker.it  This test is not yet approved or cleared by the [place]  FDA and has been authorized for detection and/or diagnosis of SARS-CoV-2 by FDA under an Emergency Use Authorization (EUA). This EUA will remain in effect (meaning this test can be used) for the duration of the COVID-19 declaration under Section 564(b)(1) of the Act, 21 U.S.C. section 360bbb-3(b)(1), unless the authorization is terminated or revoked.     Resp Syncytial Virus by PCR NEGATIVE NEGATIVE Final    Comment: (NOTE) Fact Sheet for Patients: BloggerCourse.com  Fact Sheet for Healthcare Providers: SeriousBroker.it  This test is not yet approved or cleared by the [place]  FDA and has been authorized for detection and/or diagnosis of SARS-CoV-2 by FDA under an Emergency Use Authorization (EUA). This EUA will remain in effect (meaning this test can be used) for the duration of the COVID-19 declaration under Section 564(b)(1) of the Act, 21 U.S.C. section 360bbb-3(b)(1), unless the authorization is terminated or revoked.  Performed at [facility], [address] [place], [place] [address]   Urine Culture     Status: Abnormal   Collection Time: 10/24/23  5:18 PM   Specimen: Urine, Clean Catch  Result Value Ref Range Status  Specimen Description   Final    URINE, CLEAN CATCH Performed at [facility], [address] [place], [place] [address]    Special Requests   Final    NONE Performed at [facility], [address] [place], [place] [address]    Culture (A)  Final    >=100,000 COLONIES/mL ESCHERICHIA COLI Confirmed Extended Spectrum Beta-Lactamase Producer (ESBL).  In bloodstream infections from ESBL organisms, carbapenems are preferred over piperacillin/tazobactam. They are shown to have a lower risk of mortality.    Report Status 10/26/2023 FINAL  Final   Organism ID, Bacteria ESCHERICHIA COLI (A)  Final      Susceptibility   Escherichia coli - MIC*    AMPICILLIN >=32 RESISTANT Resistant     CEFAZOLIN  >=64 RESISTANT Resistant     CEFEPIME 8 INTERMEDIATE Intermediate     CEFTRIAXONE >=64 RESISTANT Resistant     CIPROFLOXACIN  >=4 RESISTANT Resistant     GENTAMICIN  >=16 RESISTANT Resistant     IMIPENEM <=0.25 SENSITIVE Sensitive     NITROFURANTOIN  128 RESISTANT Resistant     TRIMETH/SULFA >=320 RESISTANT Resistant     AMPICILLIN/SULBACTAM >=32 RESISTANT Resistant     PIP/TAZO 8 SENSITIVE Sensitive ug/mL    * >=100,000 COLONIES/mL ESCHERICHIA COLI    [x]  Treated with Keflex , organism resistant to prescribed antimicrobial [x]  Patient discharged originally without antimicrobial agent and treatment is now indicated  New antibiotic prescription: Fosfomycin 3 g orally as a single dose  ED Provider: Debbra Fairy, PA-C   Adaline Ada, PharmD 10/27/2023, 9:04 AM Clinical Pharmacist Monday - Friday phone -  [phone] Saturday - Sunday phone - [phone]

## 2023-10-27 NOTE — Telephone Encounter (Signed)
 Post ED Visit - Positive Culture Follow-up: Successful Patient Follow-Up  Culture assessed and recommendations reviewed by:  [x]  Adaline Ada, Pharm.D. []  Skeet Duke, Pharm.D., BCPS AQ-ID []  Leslee Rase, Pharm.D., BCPS []  Garland Junk, Pharm.D., BCPS []  [place], [address].D., BCPS, AAHIVP []  Alcide Aly, Pharm.D., BCPS, AAHIVP []  Jerri Morale, PharmD, BCPS []  Graham Laws, PharmD, BCPS []  Cleda Curly, PharmD, BCPS []  Tamar Fairly, PharmD  Positive urine culture  []  Patient discharged without antimicrobial prescription and treatment is now indicated [x]  Organism is resistant to prescribed ED discharge antimicrobial []  Patient with positive blood cultures  Changes discussed with ED provider: Catana Clinch, PA-C New antibiotic prescription Fosfomycin 3g po as a single dose Called to [facility] on [address]   Contacted patient, date 10/27/2023, time 11:10 am   [name] 10/27/2023, 11:13 AM

## 2023-10-29 ENCOUNTER — Telehealth: Payer: Self-pay

## 2023-10-29 NOTE — Telephone Encounter (Signed)
 Patient calls nurse line in regards to scheduling MRI.   Will forward to referral coordinator for assistance in insurance approval.

## 2023-11-01 ENCOUNTER — Other Ambulatory Visit (HOSPITAL_COMMUNITY): Payer: Self-pay

## 2023-11-01 ENCOUNTER — Ambulatory Visit: Admitting: Family Medicine

## 2023-11-01 VITALS — BP 143/88 | HR 70 | Temp 97.9°F | Ht 62.0 in | Wt 145.4 lb

## 2023-11-01 DIAGNOSIS — Z8619 Personal history of other infectious and parasitic diseases: Secondary | ICD-10-CM | POA: Insufficient documentation

## 2023-11-01 DIAGNOSIS — N3289 Other specified disorders of bladder: Secondary | ICD-10-CM | POA: Diagnosis not present

## 2023-11-01 DIAGNOSIS — A499 Bacterial infection, unspecified: Secondary | ICD-10-CM

## 2023-11-01 DIAGNOSIS — Z1612 Extended spectrum beta lactamase (ESBL) resistance: Secondary | ICD-10-CM | POA: Diagnosis not present

## 2023-11-01 LAB — POCT UA - MICROSCOPIC ONLY

## 2023-11-01 LAB — POCT URINALYSIS DIP (MANUAL ENTRY)
Bilirubin, UA: NEGATIVE
Glucose, UA: NEGATIVE mg/dL
Ketones, POC UA: NEGATIVE mg/dL
Nitrite, UA: NEGATIVE
Protein Ur, POC: NEGATIVE mg/dL
Spec Grav, UA: 1.01 (ref 1.010–1.025)
Urobilinogen, UA: 0.2 U/dL
pH, UA: 5 (ref 5.0–8.0)

## 2023-11-01 MED ORDER — FOSFOMYCIN TROMETHAMINE 3 G PO PACK
3.0000 g | PACK | Freq: Once | ORAL | 0 refills | Status: DC
  Filled 2023-11-01: qty 3, 1d supply, fill #0

## 2023-11-01 NOTE — Progress Notes (Signed)
    SUBJECTIVE:   CHIEF COMPLAINT / HPI: urine  ESBL on Urine culture 05/04. Still having lower back pain. Urinating a lot. No pain with urination but feels "stuffy" No flank pain.  No fevers, nausea or vomiting. Got Fosfomycin on 10/27/23. Reports she had a mass several years ago that was removed with hysterectomy. Reports that has been over 5 years since he has seen urology. 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  Will reculture today.  If still growing ESBL E. coli, will need admission for likely treatment with ertapenem or Zosyn.  Also placed referral to urology given ESBL and possible bladder mass seen on CT abdomen and pelvis 2022. Bladder mass See above.  Return if symptoms worsen or fail to improve.  Ivin Marrow, MD [facility]

## 2023-11-01 NOTE — Assessment & Plan Note (Signed)
 Received fosfomycin treatment on 10/27/2023.  Will reculture today.  If still growing ESBL E. coli, will need admission for likely treatment with ertapenem or Zosyn.  Also placed referral to urology given ESBL and possible bladder mass seen on CT abdomen and pelvis 2022.

## 2023-11-01 NOTE — Patient Instructions (Addendum)
 It was great to see you! Thank you for allowing me to participate in your care!  Our plans for today:  - I will let you know the results of your urine culture over the phone.    Please arrive 15 minutes PRIOR to your next scheduled appointment time! If you do not, this affects OTHER patients' care.  Take care and seek immediate care sooner if you develop any concerns.   Ivin Marrow, MD, PGY-2 [facility] 9:09 AM 11/01/2023  [facility]

## 2023-11-01 NOTE — Assessment & Plan Note (Signed)
 See above

## 2023-11-03 ENCOUNTER — Telehealth: Payer: Self-pay

## 2023-11-03 ENCOUNTER — Ambulatory Visit (HOSPITAL_COMMUNITY)

## 2023-11-03 LAB — URINE CULTURE

## 2023-11-03 NOTE — Telephone Encounter (Signed)
 Patient LVM on nurse line regarding follow up from urine culture from 11/01/23.  Urine culture report of mixed urogenital flora.   Urology referral pending. I can provide patient with urology information once available.   Patient is asking for returned call to discuss next steps.   I am happy to call patient back with follow up plan. Please advise.

## 2023-11-06 ENCOUNTER — Ambulatory Visit (HOSPITAL_COMMUNITY)
Admission: RE | Admit: 2023-11-06 | Discharge: 2023-11-06 | Disposition: A | Discharge status: Home / Self Care | Source: Ambulatory Visit | Attending: Family Medicine | Admitting: Family Medicine

## 2023-11-06 DIAGNOSIS — G9389 Other specified disorders of brain: Secondary | ICD-10-CM | POA: Diagnosis not present

## 2023-11-06 MED ORDER — GADOBUTROL 1 MMOL/ML IV SOLN
6.0000 mL | Freq: Once | INTRAVENOUS | Status: AC | PRN
  Administered 2023-11-06: 6 mL via INTRAVENOUS

## 2023-11-08 ENCOUNTER — Ambulatory Visit (HOSPITAL_COMMUNITY)
Admission: RE | Admit: 2023-11-08 | Discharge: 2023-11-08 | Disposition: A | Discharge status: Home / Self Care | Source: Ambulatory Visit | Attending: Family Medicine | Admitting: Family Medicine

## 2023-11-08 ENCOUNTER — Ambulatory Visit: Payer: Self-pay | Admitting: Family Medicine

## 2023-11-08 DIAGNOSIS — B1920 Unspecified viral hepatitis C without hepatic coma: Secondary | ICD-10-CM | POA: Diagnosis not present

## 2023-11-08 DIAGNOSIS — K7460 Unspecified cirrhosis of liver: Secondary | ICD-10-CM | POA: Insufficient documentation

## 2023-11-08 NOTE — Telephone Encounter (Signed)
 Called patient, discussed results of culture did not indicate urinary tract infection.  She was successfully treated with fosfomycin previously.  Patient stated understanding and I answered all questions for her.

## 2023-11-18 ENCOUNTER — Ambulatory Visit: Payer: Medicare HMO | Admitting: Podiatry

## 2023-11-18 ENCOUNTER — Encounter: Payer: Self-pay | Admitting: Family Medicine

## 2023-11-18 DIAGNOSIS — B351 Tinea unguium: Secondary | ICD-10-CM | POA: Diagnosis not present

## 2023-11-18 DIAGNOSIS — L84 Corns and callosities: Secondary | ICD-10-CM | POA: Diagnosis not present

## 2023-11-18 DIAGNOSIS — M79674 Pain in right toe(s): Secondary | ICD-10-CM

## 2023-11-18 DIAGNOSIS — M79675 Pain in left toe(s): Secondary | ICD-10-CM

## 2023-11-18 DIAGNOSIS — E1142 Type 2 diabetes mellitus with diabetic polyneuropathy: Secondary | ICD-10-CM

## 2023-11-18 NOTE — Progress Notes (Signed)
  Subjective:  Patient ID: Becky Fields, female    DOB: 1946-02-24,  MRN: [id number]  Chief Complaint  Patient presents with   Nail Problem    Pt is here to have her nails trimmed     78 y.o. female presents with the above complaint. History confirmed with patient.  Callus on third toe on right foot has built up again, and the callus has returned on the great toe on the right foot.  The nails are thickened elongated causing pain and discomfort.    Leg wound remains healed and no further issues here  Objective:  Physical Exam: warm, good capillary refill, no trophic changes or ulcerative lesions, normal DP and PT pulses and normal sensory exam.    Left foot:  thickened elongated discolored dystrophic nails with subungual debris Right Foot: Plantar IPJ callus hallux, contracted hammertoe digit of third toe with preulcerative callus distal tip that is painful.  Toe is semireducible, thickened elongated discolored dystrophic nails with subungual debris.  Hallux valgus deformity.    Assessment:   No diagnosis found.         Plan:  Patient was evaluated and treated and all questions answered.  All symptomatic hyperkeratoses were safely debrided with a sterile #15 blade to patient's level of comfort without incident.  Continue using urea cream.    Discussed the etiology and treatment options for the condition in detail with the patient.  Regular debridement of the nails has helped. Sharp and mechanical debridement performed of all painful and mycotic nails today. Nails debrided in length and thickness using a nail nipper to level of comfort. Discussed treatment options including appropriate shoe gear. Follow up as needed for painful nails.    No follow-ups on file.

## 2023-11-30 ENCOUNTER — Ambulatory Visit: Payer: Self-pay | Admitting: Family Medicine

## 2023-11-30 DIAGNOSIS — G9389 Other specified disorders of brain: Secondary | ICD-10-CM

## 2023-11-30 NOTE — Telephone Encounter (Signed)
 Patient LVM on nurse line in regards to results.   She reports she is very anxious about this.   Results are still not back.   Will forward to PCP.

## 2023-12-01 DIAGNOSIS — G9389 Other specified disorders of brain: Secondary | ICD-10-CM | POA: Insufficient documentation

## 2023-12-23 ENCOUNTER — Encounter: Payer: Self-pay | Admitting: Neurosurgery

## 2023-12-23 ENCOUNTER — Ambulatory Visit: Admitting: Neurosurgery

## 2023-12-23 VITALS — BP 118/74 | HR 70 | Ht 62.0 in | Wt 146.0 lb

## 2023-12-23 DIAGNOSIS — E348 Other specified endocrine disorders: Secondary | ICD-10-CM

## 2023-12-23 DIAGNOSIS — D497 Neoplasm of unspecified behavior of endocrine glands and other parts of nervous system: Secondary | ICD-10-CM

## 2023-12-23 NOTE — Progress Notes (Signed)
 Becky Fields is an 78 y.o. female.   Chief Complaint: Unprovoked falls HPI: 78 year old lady with a history of diabetes, polyneuropathy who over the past few years has been falling.  She falls about 3-4 times a year and has not thought much of it.  This year however, she has fallen 8 times and ended up once with periorbital ecchymoses and ended up in the emergency room.  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  She says that every single time that she has fallen, she has either tripped over something or there has been an uneven surface.  She has not fallen in an unprovoked fashion where she was walking on a flat surface.  She does not have any headaches, does not have any double vision, does not have any inability to look up, does not have any incontinence either.  She does have low back pain and sciatica in her right leg.  Patient is an active individual and works in a Designer, fashion/clothing and walks frequently.  Past Medical History:  Diagnosis Date   Cataract    Diabetes mellitus    Ejection fraction    .   Hepatitis C    Hypertension    Hypertriglyceridemia    Pneumonia    Takotsubo syndrome    October 16, 2012    Past Surgical History:  Procedure Laterality Date   CHOLECYSTECTOMY     COLONOSCOPY     EYE SURGERY  02/2012   bilateral cataract surg   LEFT HEART CATHETERIZATION WITH CORONARY ANGIOGRAM N/A 10/17/2012   Procedure: LEFT HEART CATHETERIZATION WITH CORONARY ANGIOGRAM;  Surgeon: Ozell Fell, MD;  Location: [facility] CATH LAB;  Service: Cardiovascular;  Laterality: N/A;   ROBOTIC ASSISTED TOTAL HYSTERECTOMY WITH BILATERAL SALPINGO OOPHERECTOMY Bilateral 01/02/2021   Procedure: XI ROBOTIC ASSISTED TOTAL HYSTERECTOMY WITH BILATERAL SALPINGO OOPHORECTOMY AND PELVIC WASHINGS;  Surgeon: Eloy Herring, MD;  Location: [facility];  Service: Gynecology;  Laterality: Bilateral;    Family History  Problem  Relation Age of Onset   Hypertension Mother    Aneurysm Father    Hypertension Sister    CAD Brother 6   Diabetes Brother    Heart attack Neg Hx    Stroke Neg Hx    Colon cancer Neg Hx    Breast cancer Neg Hx    Ovarian cancer Neg Hx    Endometrial cancer Neg Hx    Pancreatic cancer Neg Hx    Prostate cancer Neg Hx    Social History:  reports that she has never smoked. She has never been exposed to tobacco smoke. She has never used smokeless tobacco. She reports current alcohol  use. She reports that she does not use drugs.  Allergies:  Allergies  Allergen Reactions   Lisinopril     REACTION: causes cough   Sulfa Antibiotics     Told as a child allergic    (Not in a hospital admission)   No results found for this or any previous visit (from the past 48 hours). No results found.  Review of Systems  Blood pressure 118/74, pulse 70, height 5' 2 (1.575 m), weight 146 lb (66.2 kg), SpO2 96%. Physical Exam HENT:     Head: Normocephalic.     Nose: Nose normal.  Eyes:     Pupils: Pupils are equal, round, and reactive to light.  Cardiovascular:     Rate and Rhythm: Normal rate.  Pulmonary:     Effort: Pulmonary effort is normal.  Abdominal:     General: Abdomen is flat.  Musculoskeletal:     Cervical back: Normal range of motion.  Neurological:     Mental Status: She is alert.     Cranial Nerves: Cranial nerves 2-12 are intact.     Sensory: Sensory deficit present.     Motor: No weakness.     Deep Tendon Reflexes: Babinski sign absent on the right side. Babinski sign absent on the left side.     Reflex Scores:      Patellar reflexes are 0 on the right side and 0 on the left side.      Achilles reflexes are 0 on the right side and 0 on the left side.    Comments: Decree sensation from her knees down.      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  There does not seem to be any surrounding edema which is confirmed on the MRI.  There is no hydrocephalus and the mass effect on the pineal is minimal.  I went over her history with her.  Each time she has fallen it is either been tripping over an object or over an uneven surface.  She got a cane given to her by her boss given the fact that she is 60 years young but has chosen not to use it.  I told her that in my opinion this is a chronic issue and the tumor likely has been there for a long period of time.  I do not suspect that this tumor is the cause of the falls.  I think that at her age with bony neuropathy in her feet and her back problems, monte has some scoliotic deformity], a cane would be the best way to manage this.  I do not recommend a biopsy or resection of this mass.  However, I recommended a 1-month follow-up MRI and she is comfortable with that.  She placed to return sooner if she starts having any headaches or double vision.  I went over the print out that she had made from [facility] that her family had run the diagnosis through and explained to her everything that it entails.  Dino Sable, MD 12/23/2023, 9:30 AM

## 2024-01-07 ENCOUNTER — Telehealth: Payer: Self-pay

## 2024-01-07 NOTE — Telephone Encounter (Signed)
 Patient returns call to nurse line.   Patient is confused as why she needs to see urologist. She states that mass was removed several years ago, she was unsure if provider was aware of the removal. I also advised that urology referral had been placed due to UTI with ESBL.   I was able to find in chart review from 01/23/21 (visit with Dr. Eloy) that on 01/02/21 she underwent robotic assisted total hysterectomy with BSO. 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   Patient states that UTI symptoms are no longer present. Patient states that she will still go to urologist if this is how Dr. Alba would like for her to proceed.   Please advise.   Chiquita JAYSON English, RN

## 2024-01-07 NOTE — Telephone Encounter (Signed)
 I am covering Dr. Roark inbox. Will defer this discussion to Dr. Alba since he saw patient and placed the referral.  Becky JINNY Legions, MD

## 2024-01-07 NOTE — Telephone Encounter (Signed)
 Patient LVM on nurse line in regards to Urology referral.   She reports she received a call from [facility] to schedule her initial apt.   She reports confusion on the VM as to why she was referred. She reports [facility] advised the referral was placed due to a mass.   Patient reports she is unaware of this and believes this to be a mistake.   I attempted to call patient back, however no answer.   Will forward to PCP.

## 2024-01-10 NOTE — Telephone Encounter (Signed)
 Called patient to discuss urology referral. Per chart review, CT scan from ED visit in June 2022 shows concern for possible bladder mass.  On review of ED provider note it appears urology evaluated and this was thought to be an ovarian mass not bladder mass.  Patient then underwent hysterectomy and mass removal with gynecology.  Discussed this with the patient, explained that given her ESBL UTI it is reasonable to follow-up for any further recommendations from neurology.  Patient is agreeable and will schedule an appointment.

## 2024-01-10 NOTE — Telephone Encounter (Signed)
 Any further recommendations from urology

## 2024-01-19 ENCOUNTER — Other Ambulatory Visit (HOSPITAL_BASED_OUTPATIENT_CLINIC_OR_DEPARTMENT_OTHER): Payer: Self-pay

## 2024-01-27 DIAGNOSIS — H5203 Hypermetropia, bilateral: Secondary | ICD-10-CM | POA: Diagnosis not present

## 2024-01-29 ENCOUNTER — Other Ambulatory Visit: Payer: Self-pay

## 2024-01-29 DIAGNOSIS — D497 Neoplasm of unspecified behavior of endocrine glands and other parts of nervous system: Secondary | ICD-10-CM

## 2024-01-29 NOTE — Addendum Note (Signed)
 Addended by: Jashanti Clinkscale on: 01/29/2024 03:08 PM   Modules accepted: Orders

## 2024-02-06 ENCOUNTER — Other Ambulatory Visit: Payer: Self-pay | Admitting: Family Medicine

## 2024-02-06 DIAGNOSIS — E1142 Type 2 diabetes mellitus with diabetic polyneuropathy: Secondary | ICD-10-CM

## 2024-02-06 DIAGNOSIS — I10 Essential (primary) hypertension: Secondary | ICD-10-CM

## 2024-02-17 ENCOUNTER — Ambulatory Visit (INDEPENDENT_AMBULATORY_CARE_PROVIDER_SITE_OTHER): Admitting: Podiatry

## 2024-02-17 VITALS — Ht 62.0 in | Wt 146.0 lb

## 2024-02-17 DIAGNOSIS — M79675 Pain in left toe(s): Secondary | ICD-10-CM

## 2024-02-17 DIAGNOSIS — B351 Tinea unguium: Secondary | ICD-10-CM

## 2024-02-17 DIAGNOSIS — M79674 Pain in right toe(s): Secondary | ICD-10-CM

## 2024-02-17 NOTE — Progress Notes (Signed)
  Subjective:  Patient ID: Becky Fields, female    DOB: 1945/08/25,  MRN: [id number]  Chief Complaint  Patient presents with   Diabetes    RM 21 Patient is here for diabetic foot care and nail trimming. Patient has no additional concerns today.    78 y.o. female presents with the above complaint. History confirmed with patient.  Her wound remains healed.  The calluses have not returned.  Objective:  Physical Exam: warm, good capillary refill, no trophic changes or ulcerative lesions, normal DP and PT pulses and normal sensory exam.    Left foot:  thickened elongated discolored dystrophic nails with subungual debris Right Foot: thickened elongated discolored dystrophic nails with subungual debris.  Hallux valgus deformity.    Assessment:   1. Pain due to onychomycosis of toenails of both feet            Plan:  Patient was evaluated and treated and all questions answered.  Calluses did not require debridement today  Discussed the etiology and treatment options for the condition in detail with the patient.  Regular debridement of the nails has helped. Sharp and mechanical debridement performed of all painful and mycotic nails today. Nails debrided in length and thickness using a nail nipper to level of comfort. Discussed treatment options including appropriate shoe gear. Follow up as needed for painful nails.    Return in about 3 months (around 05/19/2024) for at risk diabetic foot care.

## 2024-02-23 DIAGNOSIS — R351 Nocturia: Secondary | ICD-10-CM | POA: Diagnosis not present

## 2024-02-23 DIAGNOSIS — R350 Frequency of micturition: Secondary | ICD-10-CM | POA: Diagnosis not present

## 2024-02-23 DIAGNOSIS — N390 Urinary tract infection, site not specified: Secondary | ICD-10-CM | POA: Diagnosis not present

## 2024-02-29 ENCOUNTER — Other Ambulatory Visit: Payer: Self-pay | Admitting: Family Medicine

## 2024-02-29 DIAGNOSIS — E1142 Type 2 diabetes mellitus with diabetic polyneuropathy: Secondary | ICD-10-CM

## 2024-03-21 DIAGNOSIS — N390 Urinary tract infection, site not specified: Secondary | ICD-10-CM | POA: Diagnosis not present

## 2024-03-21 DIAGNOSIS — R350 Frequency of micturition: Secondary | ICD-10-CM | POA: Diagnosis not present

## 2024-03-22 ENCOUNTER — Inpatient Hospital Stay
Admission: RE | Admit: 2024-03-22 | Discharge: 2024-03-22 | Disposition: A | Source: Ambulatory Visit | Attending: Neurosurgery | Admitting: Neurosurgery

## 2024-03-22 DIAGNOSIS — D497 Neoplasm of unspecified behavior of endocrine glands and other parts of nervous system: Secondary | ICD-10-CM

## 2024-03-22 DIAGNOSIS — G9389 Other specified disorders of brain: Secondary | ICD-10-CM | POA: Diagnosis not present

## 2024-03-22 MED ORDER — GADOPICLENOL 0.5 MMOL/ML IV SOLN
6.0000 mL | Freq: Once | INTRAVENOUS | Status: AC | PRN
  Administered 2024-03-22: 6 mL via INTRAVENOUS

## 2024-03-28 ENCOUNTER — Encounter: Payer: Self-pay | Admitting: Neurosurgery

## 2024-03-28 ENCOUNTER — Ambulatory Visit: Admitting: Neurosurgery

## 2024-03-28 VITALS — BP 103/64 | HR 68 | Temp 98.7°F | Ht 62.0 in | Wt 145.6 lb

## 2024-03-28 DIAGNOSIS — D497 Neoplasm of unspecified behavior of endocrine glands and other parts of nervous system: Secondary | ICD-10-CM

## 2024-03-28 NOTE — Progress Notes (Signed)
 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  She was falling but she told me that each fall was related to an uneven surface of the floor or her tripping over carpet.  She was given a cane by her boss but she was not using it.  Given the size and the location of the tumor I recommended observation.  We decided to get a follow-up MRI in 3 months and she returns with that today.  She says that she has been doing very well and she has been using her cane.  Her biggest problem now is frequent UTIs and this is bothering her.  She is working with her urologist on this.  Plan : I am very sorry to hear that the UTIs are affecting her and I told her that in older age, UTIs can definitely affect patient's cognitive function and lead to confusion but it can also affect balance.  I am glad to see that she is using a cane today.  She is acknowledging that and pledges to work with her urologist on her UTIs.  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  Given that, I think observation is the right way to go.  She is very happy with the finding and we decided that she is going to come back and see me in 1 year with a follow-up MRI.  If prior to that anything changes, she pledged to call me and come and see me sooner.   Social History   Socioeconomic History   Marital status: Widowed    Spouse name: Not on file   Number of children: 2   Years of education: Bachelors   Highest education level: Bachelor's degree (e.g., BA, AB, BS)  Occupational History   Occupation: Chartered certified accountant: [employer]  Tobacco Use   Smoking status: Never    Passive exposure: Never   Smokeless tobacco: Never  Vaping Use   Vaping status: Never Used  Substance and Sexual Activity   Alcohol  use: Yes    Comment: glass of wine every now and then   Drug use: No   Sexual activity: Not Currently  Other Topics  Concern   Not on file  Social History Narrative   Patient lives alone. Husband passed away. Son is living with her for the time being.   Patient has 2 sons and grandchildren. Active in their lives.    Patient involved in animal rescue.    Enjoys reading, spending time with her family and cats.       Social Drivers of Corporate investment banker Strain: Low Risk  (12/14/2022)   Overall Financial Resource Strain (CARDIA)    Difficulty of Paying Living Expenses: Not very hard  Food Insecurity: No Food Insecurity (08/19/2023)   Hunger Vital Sign    Worried About Running Out of Food in the Last Year: Never true    Ran Out of Food in the Last Year: Never true  Transportation Needs: No Transportation Needs (12/14/2022)   PRAPARE - Administrator, Civil Service (Medical): No    Lack of Transportation (Non-Medical): No  Physical Activity: Unknown (12/14/2022)   Exercise Vital Sign    Days of Exercise per Week: 0 days    Minutes of Exercise per Session: Not on file  Recent Concern: Physical Activity - Inactive (12/14/2022)  Exercise Vital Sign    Days of Exercise per Week: 0 days    Minutes of Exercise per Session: 0 min  Stress: No Stress Concern Present (12/14/2022)   [employer] of Occupational Health - Occupational Stress Questionnaire    Feeling of Stress : Only a little  Social Connections: Moderately Isolated (12/14/2022)   Social Connection and Isolation Panel    Frequency of Communication with Friends and Family: More than three times a week    Frequency of Social Gatherings with Friends and Family: More than three times a week    Attends Religious Services: 1 to 4 times per year    Active Member of [employer] or Organizations: No    Attends Banker Meetings: Not on file    Marital Status: Widowed  Intimate Partner Violence: Not At Risk (08/27/2021)   Humiliation, Afraid, Rape, and Kick questionnaire    Fear of Current or Ex-Partner: No    Emotionally  Abused: No    Physically Abused: No    Sexually Abused: No    Family History  Problem Relation Age of Onset   Hypertension Mother    Aneurysm Father    Hypertension Sister    CAD Brother 77   Diabetes Brother    Heart attack Neg Hx    Stroke Neg Hx    Colon cancer Neg Hx    Breast cancer Neg Hx    Ovarian cancer Neg Hx    Endometrial cancer Neg Hx    Pancreatic cancer Neg Hx    Prostate cancer Neg Hx     Allergies  Allergen Reactions   Lisinopril     REACTION: causes cough   Sulfa Antibiotics     Told as a child allergic    Past Medical History:  Diagnosis Date   Cataract    Diabetes mellitus    Ejection fraction    .   Hepatitis C    Hypertension    Hypertriglyceridemia    Pneumonia    Takotsubo syndrome    October 16, 2012    Past Surgical History:  Procedure Laterality Date   CHOLECYSTECTOMY     COLONOSCOPY     EYE SURGERY  02/2012   bilateral cataract surg   LEFT HEART CATHETERIZATION WITH CORONARY ANGIOGRAM N/A 10/17/2012   Procedure: LEFT HEART CATHETERIZATION WITH CORONARY ANGIOGRAM;  Surgeon: Ozell Fell, MD;  Location: [facility] C[facility] LAB;  Service: Cardiovascular;  Laterality: N/A;   ROBOTIC ASSISTED TOTAL HYSTERECTOMY WITH BILATERAL SALPINGO OOPHERECTOMY Bilateral 01/02/2021   Procedure: XI ROBOTIC ASSISTED TOTAL HYSTERECTOMY WITH BILATERAL SALPINGO OOPHORECTOMY AND PELVIC WASHINGS;  Surgeon: Eloy Herring, MD;  Location: [facility];  Service: Gynecology;  Laterality: Bilateral;     Physical Exam     Results for orders placed or performed during the hospital encounter of 03/22/24  MR BRAIN W WO CONTRAST   Narrative   EXAM: MRI BRAIN WITH AND WITHOUT CONTRAST 03/22/2024 08:28:48 AM  TECHNIQUE: Multiplanar multisequence MRI of the head/brain was performed with and without the administration of intravenous contrast.  COMPARISON: 11/06/2023  CLINICAL HISTORY: Tumor. Mri brain wwo; 7.5 ml vial; 6 ml administered; 1.5 ml wasted; Pineal tumor; Balance  issues x 3 months  FINDINGS:  BRAIN AND VENTRICLES: No acute infarct. No acute intracranial hemorrhage. No mass effect or midline shift. No hydrocephalus. The sella is unremarkable. Normal flow voids. Unchanged appearance of pineal region mass measuring 2.2 x 1.3 cm. 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  ORBITS: No acute abnormality.  SINUSES: No acute abnormality.  BONES AND SOFT TISSUES: Normal bone marrow signal and enhancement. No acute soft tissue abnormality.  IMPRESSION: 1. Unchanged appearance of pineal region mass measuring 2.2 x 1.3 cm. 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 2. No hydrocephalus. 3. Unchanged appearance of chronic hemorrhage at the dorsal cervicomedullary junction.  Electronically signed by: Franky Stanford MD 03/24/2024 12:57 AM EDT RP Workstation: [id number]   Results for orders placed or performed during the hospital encounter of 10/24/23  CT Head Wo Contrast   Narrative   CLINICAL DATA:  Head trauma, minor (Age >= 65y).  Fall.  EXAM: CT HEAD WITHOUT CONTRAST  TECHNIQUE: Contiguous axial images were obtained from the base of the skull through the vertex without intravenous contrast.  RADIATION DOSE REDUCTION: This exam was performed according to the departmental dose-optimization program which includes automated exposure control, adjustment of the mA and/or kV according to patient size and/or use of iterative reconstruction technique.  COMPARISON:  None Available.  FINDINGS: Brain: No acute infarct or hemorrhage. No hydrocephalus. 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 While this is likely benign, given the size, this warrants further evaluation with MRI. No mass effect or midline shift.  Vascular: No hyperdense vessel or unexpected calcification.  Skull: No acute calvarial abnormality.  Sinuses/Orbits: No acute findings  Other:  Soft tissue swelling in the right forehead region.  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 While likely benign, the large size of this mass lesion warrants further evaluation with MRI with and without contrast.  No acute traumatic changes or acute infarct.   Electronically Signed   By: Franky Crease M.D.   On: 10/24/2023 17:21

## 2024-04-07 NOTE — Progress Notes (Unsigned)
   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 Planning for observation with f/u MRI in 1 year.  Hypertension, h/o Takotsubo syndrome with MVR: - Medications: losartan , metoprolol  - Compliance: good - Checking BP at home: yes, 120s SBP  Diabetes, Type 2 - Last A1c 6.5 - Medications: diet controlled - Compliance: n/a - Eye exam: UTD - Foot exam: due - Microalbumin: UTD - Statin: yes - Denies symptoms of hypoglycemia, polyuria, polydipsia, numbness extremities, foot ulcers/trauma  Recurrent UTI - seeing [facility]. Doesn't normally get urinary frequency, dysuria. Will typically get a bit confused with flank pain. Wants urine tested today. Some flank pain today, unsure if contributing, as has chronic back pain.  Feels she is getting UTI about once every few months for the last year or so. Curious about taking probiotics.  OBJECTIVE:   BP 129/62   Pulse 63   Temp (!) 97.4 F (36.3 C)   Wt 145 lb 3.2 oz (65.9 kg)   SpO2 100%   BMI 26.56 kg/m   Gen: well appearing, in NAD Card: RRR Lungs: CTAB Ext: WWP, no edema Diabetic Foot Exam - Simple   Simple Foot Form Diabetic Foot exam was performed with the following findings: Yes 04/10/2024  9:13 AM  Visual Inspection No deformities, no ulcerations, no other skin breakdown bilaterally: Yes Sensation Testing See comments: Yes Pulse Check Posterior Tibialis and Dorsalis pulse intact bilaterally: Yes Comments Intact to touch, not intact to monofilament bilaterally. Known neuropathy.       ASSESSMENT/PLAN:   HYPERTENSION, BENIGN ESSENTIAL Initially a bit elevated, improved on recheck. Tolerating meds, no changes. May consider dose reduction in future given neuropathy, prior falls. Getting labs today.  Type 2 diabetes mellitus with peripheral neuropathy (HCC) At goal, no changes.  Mass of pineal region Plan for 1 year MRI f/u with  NSG  HYPERTRIGLYCERIDEMIA Tolerant of statin, continue.  UTI (urinary tract infection) Recurrent per patient with atypical symptoms. Already seeing Urology given h/o ESBL. Some flank pain today, obtaining UA, urine cultures. Discussed possible utility of vaginal estrogen, planning to discuss with urology.   F/u 6 mths.  Donald CHRISTELLA Lai, DO

## 2024-04-10 ENCOUNTER — Ambulatory Visit: Admitting: Family Medicine

## 2024-04-10 ENCOUNTER — Encounter: Payer: Self-pay | Admitting: Family Medicine

## 2024-04-10 VITALS — BP 129/62 | HR 63 | Temp 97.4°F | Wt 145.2 lb

## 2024-04-10 DIAGNOSIS — D649 Anemia, unspecified: Secondary | ICD-10-CM

## 2024-04-10 DIAGNOSIS — I10 Essential (primary) hypertension: Secondary | ICD-10-CM | POA: Diagnosis not present

## 2024-04-10 DIAGNOSIS — G9389 Other specified disorders of brain: Secondary | ICD-10-CM | POA: Diagnosis not present

## 2024-04-10 DIAGNOSIS — R10A0 Flank pain, unspecified side: Secondary | ICD-10-CM

## 2024-04-10 DIAGNOSIS — E781 Pure hyperglyceridemia: Secondary | ICD-10-CM | POA: Diagnosis not present

## 2024-04-10 DIAGNOSIS — Z23 Encounter for immunization: Secondary | ICD-10-CM | POA: Diagnosis not present

## 2024-04-10 DIAGNOSIS — N3000 Acute cystitis without hematuria: Secondary | ICD-10-CM

## 2024-04-10 DIAGNOSIS — E1142 Type 2 diabetes mellitus with diabetic polyneuropathy: Secondary | ICD-10-CM | POA: Diagnosis not present

## 2024-04-10 LAB — POCT URINALYSIS DIP (MANUAL ENTRY)
Bilirubin, UA: NEGATIVE
Blood, UA: NEGATIVE
Glucose, UA: NEGATIVE mg/dL
Ketones, POC UA: NEGATIVE mg/dL
Leukocytes, UA: NEGATIVE
Nitrite, UA: NEGATIVE
Protein Ur, POC: NEGATIVE mg/dL
Spec Grav, UA: 1.02 (ref 1.010–1.025)
Urobilinogen, UA: 0.2 U/dL
pH, UA: 5.5 (ref 5.0–8.0)

## 2024-04-10 LAB — POCT GLYCOSYLATED HEMOGLOBIN (HGB A1C): HbA1c, POC (controlled diabetic range): 6.6 % (ref 0.0–7.0)

## 2024-04-10 NOTE — Assessment & Plan Note (Signed)
Tolerant of statin, continue. 

## 2024-04-10 NOTE — Assessment & Plan Note (Addendum)
 Initially a bit elevated, improved on recheck. Tolerating meds, no changes. May consider dose reduction in future given neuropathy, prior falls. Getting labs today.

## 2024-04-10 NOTE — Assessment & Plan Note (Signed)
 Recurrent per patient with atypical symptoms. Already seeing Urology given h/o ESBL. Some flank pain today, obtaining UA, urine cultures. Discussed possible utility of vaginal estrogen, planning to discuss with urology.

## 2024-04-10 NOTE — Assessment & Plan Note (Signed)
 Plan for 1 year MRI f/u with NSG

## 2024-04-10 NOTE — Assessment & Plan Note (Signed)
 At goal, no changes

## 2024-04-10 NOTE — Patient Instructions (Signed)
 It was great to see you!  Our plans for today:  - No changes to your medicaitons today.  - Consider increasing your soluble fiber: sweet potatoes, avocado, legumes (chickpeas, black beans, etc) to decrease your loose stools.  - Consider asking your urologist about vaginal estrogen to reduce UTIs.  We are checking some labs today, we will release these results to your MyChart.  Take care and seek immediate care sooner if you develop any concerns.   Dr. Johnnetta Holstine

## 2024-04-12 ENCOUNTER — Encounter: Admitting: Family Medicine

## 2024-04-12 ENCOUNTER — Other Ambulatory Visit

## 2024-04-12 ENCOUNTER — Ambulatory Visit: Payer: Self-pay | Admitting: Family Medicine

## 2024-04-12 DIAGNOSIS — D649 Anemia, unspecified: Secondary | ICD-10-CM | POA: Diagnosis not present

## 2024-04-12 DIAGNOSIS — I10 Essential (primary) hypertension: Secondary | ICD-10-CM | POA: Diagnosis not present

## 2024-04-12 DIAGNOSIS — E1142 Type 2 diabetes mellitus with diabetic polyneuropathy: Secondary | ICD-10-CM | POA: Diagnosis not present

## 2024-04-13 LAB — CBC
Hematocrit: 40 % (ref 34.0–46.6)
Hemoglobin: 13.6 g/dL (ref 11.1–15.9)
MCH: 31.6 pg (ref 26.6–33.0)
MCHC: 34 g/dL (ref 31.5–35.7)
MCV: 93 fL (ref 79–97)
Platelets: 148 x10E3/uL — ABNORMAL LOW (ref 150–450)
RBC: 4.31 x10E6/uL (ref 3.77–5.28)
RDW: 12.3 % (ref 11.7–15.4)
WBC: 5.2 x10E3/uL (ref 3.4–10.8)

## 2024-04-13 LAB — BASIC METABOLIC PANEL WITH GFR
BUN/Creatinine Ratio: 26 (ref 12–28)
BUN: 19 mg/dL (ref 8–27)
CO2: 22 mmol/L (ref 20–29)
Calcium: 8.9 mg/dL (ref 8.7–10.3)
Chloride: 105 mmol/L (ref 96–106)
Creatinine, Ser: 0.72 mg/dL (ref 0.57–1.00)
Glucose: 89 mg/dL (ref 70–99)
Potassium: 4.1 mmol/L (ref 3.5–5.2)
Sodium: 140 mmol/L (ref 134–144)
eGFR: 86 mL/min/1.73 (ref >59)

## 2024-04-13 LAB — URINE CULTURE

## 2024-04-27 ENCOUNTER — Encounter

## 2024-05-01 ENCOUNTER — Other Ambulatory Visit: Payer: Self-pay | Admitting: Family Medicine

## 2024-05-01 DIAGNOSIS — E781 Pure hyperglyceridemia: Secondary | ICD-10-CM

## 2024-05-23 ENCOUNTER — Ambulatory Visit: Admitting: Podiatry

## 2024-05-23 DIAGNOSIS — M79674 Pain in right toe(s): Secondary | ICD-10-CM | POA: Diagnosis not present

## 2024-05-23 DIAGNOSIS — B351 Tinea unguium: Secondary | ICD-10-CM | POA: Diagnosis not present

## 2024-05-23 DIAGNOSIS — B353 Tinea pedis: Secondary | ICD-10-CM

## 2024-05-23 DIAGNOSIS — L84 Corns and callosities: Secondary | ICD-10-CM | POA: Diagnosis not present

## 2024-05-23 DIAGNOSIS — M79675 Pain in left toe(s): Secondary | ICD-10-CM | POA: Diagnosis not present

## 2024-05-23 DIAGNOSIS — E1142 Type 2 diabetes mellitus with diabetic polyneuropathy: Secondary | ICD-10-CM | POA: Diagnosis not present

## 2024-05-23 MED ORDER — KETOCONAZOLE 2 % EX CREA: 1.0000 | TOPICAL_CREAM | Freq: Every day | CUTANEOUS | 2 refills | Status: AC

## 2024-05-23 NOTE — Progress Notes (Signed)
  Subjective:  Patient ID: Becky Fields, female    DOB: 05-Apr-1946,  MRN: [id number]  Chief Complaint  Patient presents with   Nail Problem    Here for at risk diabetic footcare has new peeling skin issue on right foot    78 y.o. female presents with the above complaint. History confirmed with patient.  Her wound remains healed.  The calluses have built up again on the right foot  Objective:  Physical Exam: warm, good capillary refill, no active ulcerative lesions, normal DP and PT pulses and abnormal sensory exam with diffuse polyneuropathy, has minimal pedal hair growth    Left foot:  thickened elongated discolored dystrophic nails with subungual debris Right Foot: thickened elongated discolored dystrophic nails with subungual debris.  Hallux valgus deformity.  Plantar medial pinch callus on first MTPJ  Assessment:   1. Pain due to onychomycosis of toenails of both feet   2. Tinea pedis of right foot   3. Callus of foot   4. Type 2 diabetes mellitus with peripheral neuropathy (HCC)            Plan:  Patient was evaluated and treated and all questions answered. All symptomatic hyperkeratoses were safely debrided with a sterile #15 blade to patient's level of comfort without incident. We discussed preventative and palliative care of these lesions including supportive and accommodative shoegear, padding, prefabricated and custom molded accommodative orthoses, use of a pumice stone and lotions/creams daily.  Discussed the etiology and treatment options for the condition in detail with the patient.  Regular debridement of the nails has helped. Sharp and mechanical debridement performed of all painful and mycotic nails today. Nails debrided in length and thickness using a nail nipper to level of comfort. Discussed treatment options including appropriate shoe gear. Follow up as needed for painful nails.  Discussed the etiology and treatment options for tinea pedis.  Discussed topical  and oral treatment.  Recommended topical treatment with 2% ketoconazole  cream.  This was sent to the patient's pharmacy.  Also discussed appropriate foot hygiene, use of antifungal spray such as Tinactin in shoes, as well as cleaning her foot surfaces such as showers and bathroom floors with bleach.   Return in about 3 months (around 08/21/2024) for at risk diabetic foot care.

## 2024-07-24 ENCOUNTER — Encounter

## 2024-08-29 ENCOUNTER — Ambulatory Visit: Admitting: Podiatry

## 2024-09-11 ENCOUNTER — Encounter

## 2025-03-27 ENCOUNTER — Ambulatory Visit: Admitting: Neurosurgery
